# Patient Record
Sex: Female | Born: 1940 | Race: White | Hispanic: No | Marital: Single | Smoking: Former smoker
Health system: Southern US, Community
[De-identification: ages and names within clinical notes are randomized; demographics above are authoritative.]

## PROBLEM LIST (undated history)

## (undated) DIAGNOSIS — C801 Malignant (primary) neoplasm, unspecified: Secondary | ICD-10-CM

## (undated) DIAGNOSIS — Z923 Personal history of irradiation: Secondary | ICD-10-CM

## (undated) HISTORY — PX: BREAST EXCISIONAL BIOPSY: SUR124

## (undated) HISTORY — PX: BREAST BIOPSY: SHX20

## (undated) HISTORY — PX: ABDOMINAL HYSTERECTOMY: SHX81

---

## 2002-12-30 ENCOUNTER — Other Ambulatory Visit: Payer: Self-pay

## 2003-02-03 ENCOUNTER — Other Ambulatory Visit: Payer: Self-pay

## 2003-08-03 ENCOUNTER — Other Ambulatory Visit: Payer: Self-pay

## 2003-11-02 ENCOUNTER — Other Ambulatory Visit: Payer: Self-pay

## 2003-11-19 ENCOUNTER — Ambulatory Visit: Payer: Self-pay | Admitting: Ophthalmology

## 2003-11-23 ENCOUNTER — Ambulatory Visit: Payer: Self-pay | Admitting: Ophthalmology

## 2003-12-07 ENCOUNTER — Emergency Department: Payer: Self-pay | Admitting: Internal Medicine

## 2003-12-23 ENCOUNTER — Emergency Department: Payer: Self-pay | Admitting: Unknown Physician Specialty

## 2003-12-23 ENCOUNTER — Other Ambulatory Visit: Payer: Self-pay

## 2004-01-05 ENCOUNTER — Emergency Department: Payer: Self-pay | Admitting: Unknown Physician Specialty

## 2004-01-13 ENCOUNTER — Ambulatory Visit: Payer: Self-pay | Admitting: Family Medicine

## 2004-03-12 ENCOUNTER — Emergency Department: Payer: Self-pay | Admitting: Emergency Medicine

## 2004-03-22 ENCOUNTER — Emergency Department: Payer: Self-pay | Admitting: Emergency Medicine

## 2004-05-03 ENCOUNTER — Emergency Department: Payer: Self-pay | Admitting: Unknown Physician Specialty

## 2004-05-27 ENCOUNTER — Emergency Department: Payer: Self-pay | Admitting: Emergency Medicine

## 2004-09-18 ENCOUNTER — Ambulatory Visit: Payer: Self-pay | Admitting: Radiation Oncology

## 2004-10-13 ENCOUNTER — Ambulatory Visit: Payer: Self-pay | Admitting: Radiation Oncology

## 2004-11-12 ENCOUNTER — Ambulatory Visit: Payer: Self-pay | Admitting: Radiation Oncology

## 2004-12-13 ENCOUNTER — Ambulatory Visit: Payer: Self-pay | Admitting: Radiation Oncology

## 2005-01-12 ENCOUNTER — Ambulatory Visit: Payer: Self-pay | Admitting: Radiation Oncology

## 2005-03-02 ENCOUNTER — Emergency Department: Payer: Self-pay | Admitting: Emergency Medicine

## 2005-03-09 ENCOUNTER — Ambulatory Visit: Payer: Self-pay | Admitting: Radiation Oncology

## 2005-04-20 ENCOUNTER — Emergency Department: Payer: Self-pay | Admitting: Emergency Medicine

## 2005-08-30 ENCOUNTER — Ambulatory Visit: Payer: Self-pay | Admitting: Radiation Oncology

## 2005-09-18 ENCOUNTER — Emergency Department: Payer: Self-pay | Admitting: Emergency Medicine

## 2005-11-13 ENCOUNTER — Ambulatory Visit: Payer: Self-pay | Admitting: Unknown Physician Specialty

## 2005-11-16 ENCOUNTER — Ambulatory Visit: Payer: Self-pay | Admitting: Unknown Physician Specialty

## 2005-12-12 ENCOUNTER — Ambulatory Visit: Payer: Self-pay | Admitting: *Deleted

## 2005-12-24 ENCOUNTER — Ambulatory Visit: Payer: Self-pay | Admitting: Unknown Physician Specialty

## 2006-01-28 ENCOUNTER — Ambulatory Visit: Payer: Self-pay | Admitting: Radiation Oncology

## 2006-01-29 ENCOUNTER — Emergency Department: Payer: Self-pay | Admitting: Emergency Medicine

## 2006-02-10 ENCOUNTER — Emergency Department: Payer: Self-pay | Admitting: Emergency Medicine

## 2006-04-19 ENCOUNTER — Emergency Department: Payer: Self-pay | Admitting: Emergency Medicine

## 2006-04-19 ENCOUNTER — Other Ambulatory Visit: Payer: Self-pay

## 2006-05-20 ENCOUNTER — Emergency Department: Payer: Self-pay | Admitting: Emergency Medicine

## 2006-06-07 ENCOUNTER — Other Ambulatory Visit: Payer: Self-pay

## 2006-06-07 ENCOUNTER — Emergency Department: Payer: Self-pay | Admitting: Emergency Medicine

## 2006-09-13 ENCOUNTER — Ambulatory Visit: Payer: Self-pay | Admitting: Radiation Oncology

## 2006-10-03 ENCOUNTER — Ambulatory Visit: Payer: Self-pay | Admitting: Radiation Oncology

## 2006-10-14 ENCOUNTER — Ambulatory Visit: Payer: Self-pay | Admitting: Radiation Oncology

## 2006-10-24 ENCOUNTER — Ambulatory Visit: Payer: Self-pay | Admitting: Radiation Oncology

## 2007-07-02 IMAGING — CR PELVIS - 1-2 VIEW
1 series · 1 of 1 positions shown · non-contrast
Comparison: none

REASON FOR EXAM: fall     Minor care 4
COMMENTS:   LMP: Post Hysterectomy

[view not recorded]
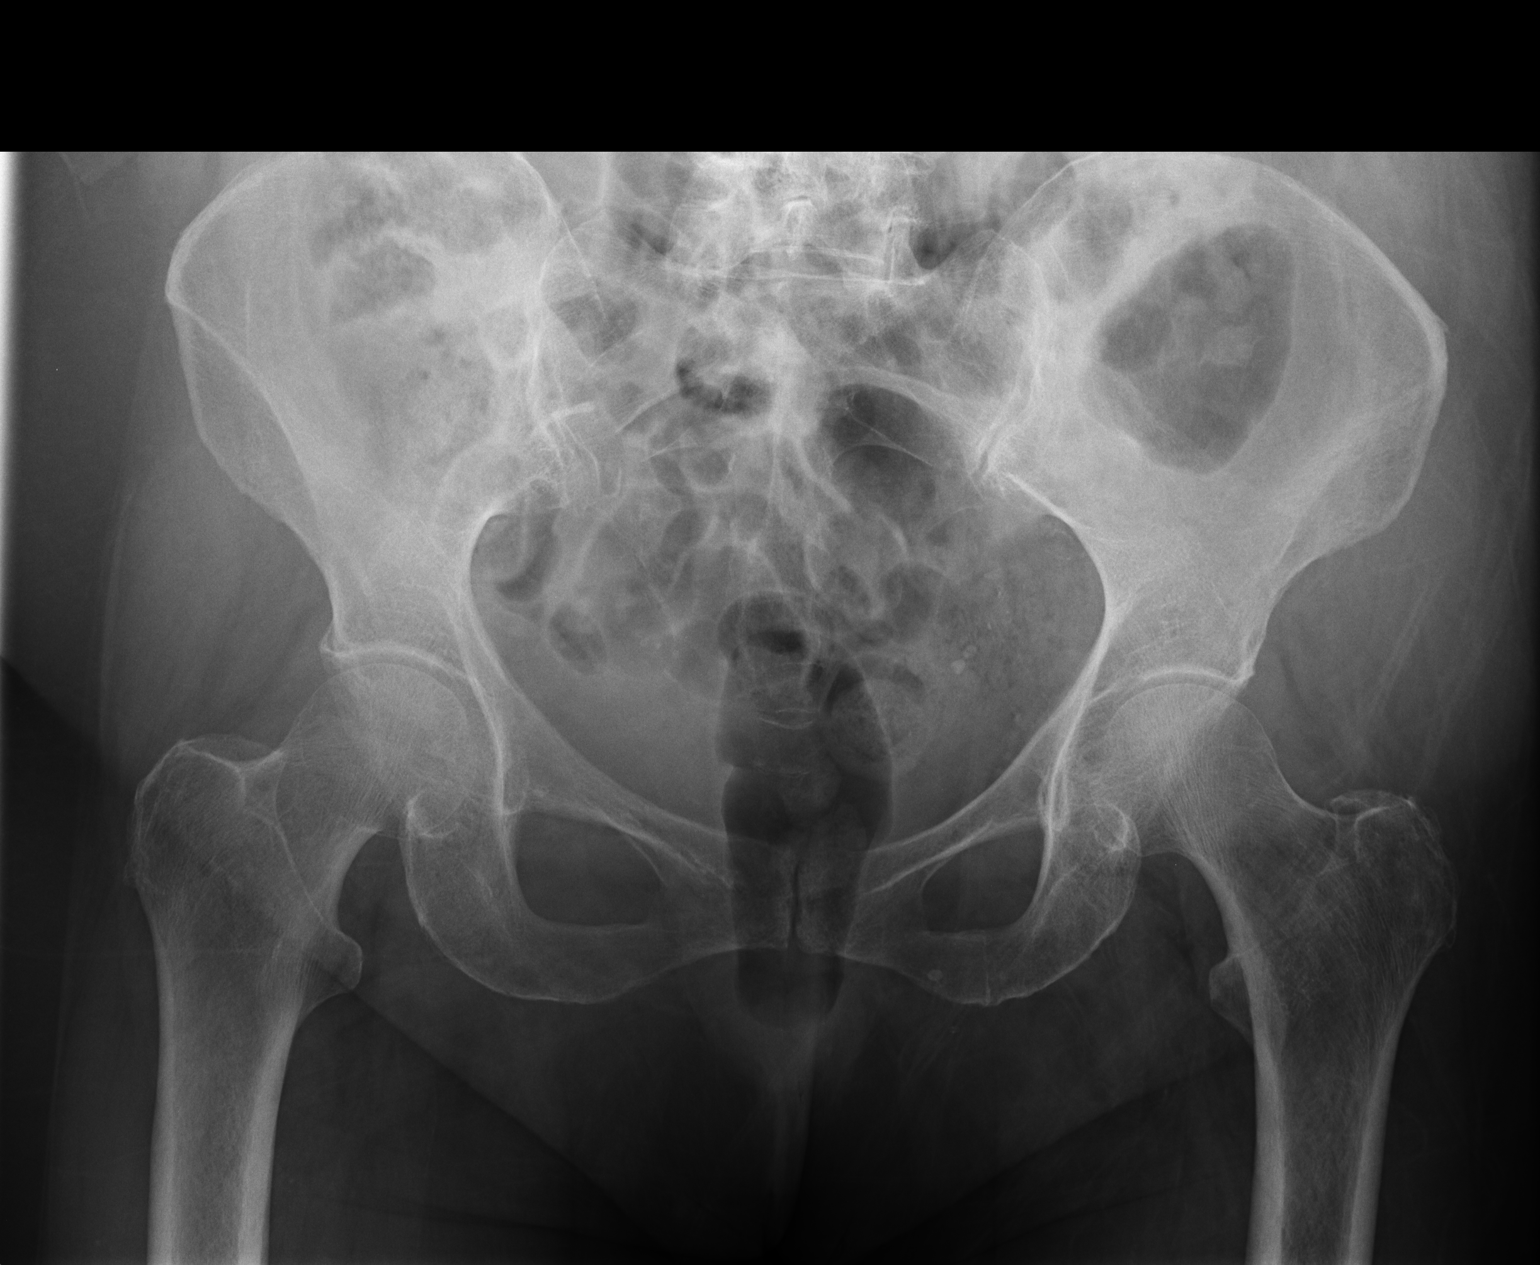

[1 of 1 positions shown; findings below may reference images not displayed]

PROCEDURE:     DXR - DXR PELVIS AP ONLY  - May 20, 2006  [DATE]

RESULT:     The bony pelvis is osteopenic. I do not see evidence of an acute
displaced fracture. There is linear lucency as well as sclerosis noted
through the inferior pubic ramus that is not seen on the accompanying hip
series. This is not felt to reflect an acute fracture. Elsewhere the bony
pelvis appears intact as well.
IMPRESSION: 1.I see no acute bony abnormality of the pelvis. Should the patient's
symptoms persist, follow-up imaging is available upon request.

## 2007-10-01 ENCOUNTER — Ambulatory Visit: Payer: Self-pay

## 2007-10-02 ENCOUNTER — Ambulatory Visit: Payer: Self-pay | Admitting: Radiation Oncology

## 2007-10-14 ENCOUNTER — Ambulatory Visit: Payer: Self-pay | Admitting: Radiation Oncology

## 2008-10-13 ENCOUNTER — Ambulatory Visit: Payer: Self-pay | Admitting: Radiation Oncology

## 2008-11-02 ENCOUNTER — Ambulatory Visit: Payer: Self-pay

## 2008-11-09 ENCOUNTER — Ambulatory Visit: Payer: Self-pay | Admitting: Radiation Oncology

## 2008-11-12 ENCOUNTER — Ambulatory Visit: Payer: Self-pay | Admitting: Radiation Oncology

## 2009-08-16 ENCOUNTER — Emergency Department: Payer: Self-pay | Admitting: Emergency Medicine

## 2009-11-24 ENCOUNTER — Ambulatory Visit: Payer: Self-pay

## 2009-12-01 ENCOUNTER — Ambulatory Visit: Payer: Self-pay | Admitting: Radiation Oncology

## 2009-12-13 ENCOUNTER — Ambulatory Visit: Payer: Self-pay | Admitting: Radiation Oncology

## 2010-11-27 ENCOUNTER — Ambulatory Visit: Payer: Self-pay | Admitting: Family

## 2010-11-30 ENCOUNTER — Ambulatory Visit: Payer: Self-pay | Admitting: Radiation Oncology

## 2010-12-14 ENCOUNTER — Ambulatory Visit: Payer: Self-pay | Admitting: Radiation Oncology

## 2011-12-06 ENCOUNTER — Ambulatory Visit: Payer: Self-pay | Admitting: Family

## 2011-12-17 ENCOUNTER — Ambulatory Visit: Payer: Self-pay | Admitting: Family

## 2012-02-14 ENCOUNTER — Ambulatory Visit: Payer: Self-pay | Admitting: Surgery

## 2012-03-06 ENCOUNTER — Ambulatory Visit: Payer: Self-pay | Admitting: Surgery

## 2012-03-11 LAB — PATHOLOGY REPORT

## 2014-10-27 ENCOUNTER — Other Ambulatory Visit: Payer: Self-pay | Admitting: Geriatric Medicine

## 2014-10-27 DIAGNOSIS — Z1231 Encounter for screening mammogram for malignant neoplasm of breast: Secondary | ICD-10-CM

## 2014-11-15 ENCOUNTER — Other Ambulatory Visit: Payer: Self-pay | Admitting: Geriatric Medicine

## 2014-11-15 DIAGNOSIS — M8580 Other specified disorders of bone density and structure, unspecified site: Secondary | ICD-10-CM

## 2014-11-24 ENCOUNTER — Ambulatory Visit
Admission: RE | Admit: 2014-11-24 | Discharge: 2014-11-24 | Disposition: A | Discharge status: Home / Self Care | Payer: Medicare (Managed Care) | Source: Ambulatory Visit | Attending: Geriatric Medicine | Admitting: Geriatric Medicine

## 2014-11-24 DIAGNOSIS — M810 Age-related osteoporosis without current pathological fracture: Secondary | ICD-10-CM | POA: Insufficient documentation

## 2014-11-24 DIAGNOSIS — Z1231 Encounter for screening mammogram for malignant neoplasm of breast: Secondary | ICD-10-CM

## 2014-11-24 DIAGNOSIS — Z13820 Encounter for screening for osteoporosis: Secondary | ICD-10-CM | POA: Diagnosis present

## 2014-11-24 DIAGNOSIS — M8580 Other specified disorders of bone density and structure, unspecified site: Secondary | ICD-10-CM

## 2014-11-24 HISTORY — DX: Personal history of irradiation: Z92.3

## 2014-11-24 HISTORY — DX: Malignant (primary) neoplasm, unspecified: C80.1

## 2015-03-05 ENCOUNTER — Encounter: Payer: Self-pay | Admitting: Emergency Medicine

## 2015-03-05 ENCOUNTER — Emergency Department: Payer: Medicare (Managed Care)

## 2015-03-05 ENCOUNTER — Inpatient Hospital Stay
Admission: EM | Admit: 2015-03-05 | Discharge: 2015-04-13 | DRG: 208 | Disposition: E | Payer: Medicare (Managed Care) | Attending: Internal Medicine | Admitting: Internal Medicine

## 2015-03-05 DIAGNOSIS — Z66 Do not resuscitate: Secondary | ICD-10-CM | POA: Diagnosis present

## 2015-03-05 DIAGNOSIS — R1110 Vomiting, unspecified: Secondary | ICD-10-CM

## 2015-03-05 DIAGNOSIS — H5702 Anisocoria: Secondary | ICD-10-CM | POA: Diagnosis present

## 2015-03-05 DIAGNOSIS — E873 Alkalosis: Secondary | ICD-10-CM | POA: Diagnosis not present

## 2015-03-05 DIAGNOSIS — Z781 Physical restraint status: Secondary | ICD-10-CM

## 2015-03-05 DIAGNOSIS — J181 Lobar pneumonia, unspecified organism: Secondary | ICD-10-CM

## 2015-03-05 DIAGNOSIS — I10 Essential (primary) hypertension: Secondary | ICD-10-CM | POA: Diagnosis present

## 2015-03-05 DIAGNOSIS — M40209 Unspecified kyphosis, site unspecified: Secondary | ICD-10-CM | POA: Diagnosis present

## 2015-03-05 DIAGNOSIS — J441 Chronic obstructive pulmonary disease with (acute) exacerbation: Secondary | ICD-10-CM | POA: Diagnosis present

## 2015-03-05 DIAGNOSIS — L8990 Pressure ulcer of unspecified site, unspecified stage: Secondary | ICD-10-CM | POA: Insufficient documentation

## 2015-03-05 DIAGNOSIS — J690 Pneumonitis due to inhalation of food and vomit: Secondary | ICD-10-CM | POA: Diagnosis present

## 2015-03-05 DIAGNOSIS — I214 Non-ST elevation (NSTEMI) myocardial infarction: Secondary | ICD-10-CM | POA: Diagnosis present

## 2015-03-05 DIAGNOSIS — Z87891 Personal history of nicotine dependence: Secondary | ICD-10-CM

## 2015-03-05 DIAGNOSIS — J9690 Respiratory failure, unspecified, unspecified whether with hypoxia or hypercapnia: Secondary | ICD-10-CM | POA: Insufficient documentation

## 2015-03-05 DIAGNOSIS — I2510 Atherosclerotic heart disease of native coronary artery without angina pectoris: Secondary | ICD-10-CM | POA: Diagnosis present

## 2015-03-05 DIAGNOSIS — T17990A Other foreign object in respiratory tract, part unspecified in causing asphyxiation, initial encounter: Secondary | ICD-10-CM | POA: Diagnosis present

## 2015-03-05 DIAGNOSIS — Z923 Personal history of irradiation: Secondary | ICD-10-CM | POA: Diagnosis not present

## 2015-03-05 DIAGNOSIS — E785 Hyperlipidemia, unspecified: Secondary | ICD-10-CM | POA: Diagnosis present

## 2015-03-05 DIAGNOSIS — K7460 Unspecified cirrhosis of liver: Secondary | ICD-10-CM | POA: Diagnosis present

## 2015-03-05 DIAGNOSIS — D649 Anemia, unspecified: Secondary | ICD-10-CM | POA: Diagnosis present

## 2015-03-05 DIAGNOSIS — J9622 Acute and chronic respiratory failure with hypercapnia: Secondary | ICD-10-CM | POA: Diagnosis not present

## 2015-03-05 DIAGNOSIS — J440 Chronic obstructive pulmonary disease with acute lower respiratory infection: Principal | ICD-10-CM | POA: Diagnosis present

## 2015-03-05 DIAGNOSIS — I272 Other secondary pulmonary hypertension: Secondary | ICD-10-CM | POA: Diagnosis present

## 2015-03-05 DIAGNOSIS — Z7982 Long term (current) use of aspirin: Secondary | ICD-10-CM

## 2015-03-05 DIAGNOSIS — J9621 Acute and chronic respiratory failure with hypoxia: Secondary | ICD-10-CM | POA: Diagnosis not present

## 2015-03-05 DIAGNOSIS — R1310 Dysphagia, unspecified: Secondary | ICD-10-CM | POA: Diagnosis present

## 2015-03-05 DIAGNOSIS — J9601 Acute respiratory failure with hypoxia: Secondary | ICD-10-CM | POA: Insufficient documentation

## 2015-03-05 DIAGNOSIS — K7200 Acute and subacute hepatic failure without coma: Secondary | ICD-10-CM | POA: Diagnosis present

## 2015-03-05 DIAGNOSIS — R0600 Dyspnea, unspecified: Secondary | ICD-10-CM | POA: Diagnosis not present

## 2015-03-05 DIAGNOSIS — T17890A Other foreign object in other parts of respiratory tract causing asphyxiation, initial encounter: Secondary | ICD-10-CM | POA: Diagnosis present

## 2015-03-05 DIAGNOSIS — E872 Acidosis: Secondary | ICD-10-CM | POA: Diagnosis present

## 2015-03-05 DIAGNOSIS — I248 Other forms of acute ischemic heart disease: Secondary | ICD-10-CM | POA: Diagnosis present

## 2015-03-05 DIAGNOSIS — R740 Nonspecific elevation of levels of transaminase and lactic acid dehydrogenase [LDH]: Secondary | ICD-10-CM | POA: Diagnosis present

## 2015-03-05 DIAGNOSIS — Y95 Nosocomial condition: Secondary | ICD-10-CM | POA: Diagnosis present

## 2015-03-05 DIAGNOSIS — I959 Hypotension, unspecified: Secondary | ICD-10-CM | POA: Diagnosis not present

## 2015-03-05 DIAGNOSIS — J159 Unspecified bacterial pneumonia: Secondary | ICD-10-CM | POA: Diagnosis present

## 2015-03-05 DIAGNOSIS — Z515 Encounter for palliative care: Secondary | ICD-10-CM | POA: Diagnosis present

## 2015-03-05 DIAGNOSIS — J9602 Acute respiratory failure with hypercapnia: Secondary | ICD-10-CM | POA: Diagnosis not present

## 2015-03-05 DIAGNOSIS — D696 Thrombocytopenia, unspecified: Secondary | ICD-10-CM | POA: Diagnosis present

## 2015-03-05 DIAGNOSIS — J90 Pleural effusion, not elsewhere classified: Secondary | ICD-10-CM | POA: Diagnosis present

## 2015-03-05 DIAGNOSIS — N179 Acute kidney failure, unspecified: Secondary | ICD-10-CM | POA: Diagnosis present

## 2015-03-05 DIAGNOSIS — E039 Hypothyroidism, unspecified: Secondary | ICD-10-CM | POA: Diagnosis present

## 2015-03-05 DIAGNOSIS — E876 Hypokalemia: Secondary | ICD-10-CM | POA: Diagnosis present

## 2015-03-05 DIAGNOSIS — Z01818 Encounter for other preprocedural examination: Secondary | ICD-10-CM | POA: Insufficient documentation

## 2015-03-05 DIAGNOSIS — Z853 Personal history of malignant neoplasm of breast: Secondary | ICD-10-CM

## 2015-03-05 DIAGNOSIS — T50995A Adverse effect of other drugs, medicaments and biological substances, initial encounter: Secondary | ICD-10-CM | POA: Diagnosis present

## 2015-03-05 DIAGNOSIS — F0390 Unspecified dementia without behavioral disturbance: Secondary | ICD-10-CM | POA: Diagnosis present

## 2015-03-05 DIAGNOSIS — R918 Other nonspecific abnormal finding of lung field: Secondary | ICD-10-CM | POA: Diagnosis not present

## 2015-03-05 DIAGNOSIS — R945 Abnormal results of liver function studies: Secondary | ICD-10-CM

## 2015-03-05 DIAGNOSIS — R7989 Other specified abnormal findings of blood chemistry: Secondary | ICD-10-CM | POA: Diagnosis not present

## 2015-03-05 DIAGNOSIS — R0602 Shortness of breath: Secondary | ICD-10-CM

## 2015-03-05 DIAGNOSIS — Z9911 Dependence on respirator [ventilator] status: Secondary | ICD-10-CM | POA: Diagnosis not present

## 2015-03-05 DIAGNOSIS — Z978 Presence of other specified devices: Secondary | ICD-10-CM

## 2015-03-05 DIAGNOSIS — J189 Pneumonia, unspecified organism: Secondary | ICD-10-CM

## 2015-03-05 DIAGNOSIS — R109 Unspecified abdominal pain: Secondary | ICD-10-CM

## 2015-03-05 DIAGNOSIS — Z9981 Dependence on supplemental oxygen: Secondary | ICD-10-CM

## 2015-03-05 DIAGNOSIS — E870 Hyperosmolality and hypernatremia: Secondary | ICD-10-CM | POA: Diagnosis not present

## 2015-03-05 DIAGNOSIS — IMO0001 Reserved for inherently not codable concepts without codable children: Secondary | ICD-10-CM

## 2015-03-05 DIAGNOSIS — Z452 Encounter for adjustment and management of vascular access device: Secondary | ICD-10-CM

## 2015-03-05 DIAGNOSIS — Z4659 Encounter for fitting and adjustment of other gastrointestinal appliance and device: Secondary | ICD-10-CM

## 2015-03-05 LAB — CBC WITH DIFFERENTIAL/PLATELET
BASOS PCT: 0 %
Basophils Absolute: 0 10*3/uL (ref 0–0.1)
EOS ABS: 0 10*3/uL (ref 0–0.7)
EOS PCT: 0 %
HCT: 41.6 % (ref 35.0–47.0)
HEMOGLOBIN: 13.7 g/dL (ref 12.0–16.0)
LYMPHS ABS: 0.8 10*3/uL — AB (ref 1.0–3.6)
Lymphocytes Relative: 6 %
MCH: 31 pg (ref 26.0–34.0)
MCHC: 33 g/dL (ref 32.0–36.0)
MCV: 93.9 fL (ref 80.0–100.0)
MONO ABS: 0.9 10*3/uL (ref 0.2–0.9)
MONOS PCT: 8 %
NEUTROS PCT: 86 %
Neutro Abs: 10.4 10*3/uL — ABNORMAL HIGH (ref 1.4–6.5)
Platelets: 232 10*3/uL (ref 150–440)
RBC: 4.43 MIL/uL (ref 3.80–5.20)
RDW: 14.7 % — AB (ref 11.5–14.5)
WBC: 12.1 10*3/uL — ABNORMAL HIGH (ref 3.6–11.0)

## 2015-03-05 LAB — PROTIME-INR
INR: 1.46
PROTHROMBIN TIME: 17.8 s — AB (ref 11.4–15.0)

## 2015-03-05 LAB — COMPREHENSIVE METABOLIC PANEL
ALK PHOS: 133 U/L — AB (ref 38–126)
ALT: 1249 U/L — ABNORMAL HIGH (ref 14–54)
ANION GAP: 10 (ref 5–15)
AST: 1143 U/L — ABNORMAL HIGH (ref 15–41)
Albumin: 3.9 g/dL (ref 3.5–5.0)
BILIRUBIN TOTAL: 0.9 mg/dL (ref 0.3–1.2)
BUN: 54 mg/dL — ABNORMAL HIGH (ref 6–20)
CALCIUM: 8.6 mg/dL — AB (ref 8.9–10.3)
CO2: 40 mmol/L — AB (ref 22–32)
Chloride: 91 mmol/L — ABNORMAL LOW (ref 101–111)
Creatinine, Ser: 2.26 mg/dL — ABNORMAL HIGH (ref 0.44–1.00)
GFR calc non Af Amer: 20 mL/min — ABNORMAL LOW (ref >60)
GFR, EST AFRICAN AMERICAN: 23 mL/min — AB (ref >60)
Glucose, Bld: 120 mg/dL — ABNORMAL HIGH (ref 65–99)
POTASSIUM: 3.1 mmol/L — AB (ref 3.5–5.1)
SODIUM: 141 mmol/L (ref 135–145)
TOTAL PROTEIN: 6.8 g/dL (ref 6.5–8.1)

## 2015-03-05 LAB — ACETAMINOPHEN LEVEL: Acetaminophen (Tylenol), Serum: <10 ug/mL — ABNORMAL LOW (ref 10–30)

## 2015-03-05 LAB — TROPONIN I
TROPONIN I: 5.55 ng/mL — AB (ref <0.031)
Troponin I: 3.97 ng/mL — ABNORMAL HIGH (ref <0.031)

## 2015-03-05 LAB — APTT: aPTT: 27 seconds (ref 24–36)

## 2015-03-05 MED ORDER — DEXTROSE 5 % IV SOLN
2.0000 g | Freq: Once | INTRAVENOUS | Status: AC
  Administered 2015-03-05: 2 g via INTRAVENOUS
  Filled 2015-03-05: qty 2

## 2015-03-05 MED ORDER — ATORVASTATIN CALCIUM 20 MG PO TABS
20.0000 mg | ORAL_TABLET | Freq: Every day | ORAL | Status: DC
  Administered 2015-03-05 – 2015-03-07 (×3): 20 mg via ORAL
  Filled 2015-03-05 (×3): qty 1

## 2015-03-05 MED ORDER — VANCOMYCIN HCL IN DEXTROSE 1-5 GM/200ML-% IV SOLN
1000.0000 mg | Freq: Once | INTRAVENOUS | Status: AC
  Administered 2015-03-05: 1000 mg via INTRAVENOUS
  Filled 2015-03-05: qty 200

## 2015-03-05 MED ORDER — SENNA 8.6 MG PO TABS
2.0000 | ORAL_TABLET | Freq: Every day | ORAL | Status: DC
  Administered 2015-03-10 – 2015-03-20 (×9): 17.2 mg via ORAL
  Filled 2015-03-05 (×14): qty 2

## 2015-03-05 MED ORDER — DEXTROSE 5 % IV SOLN
1.0000 g | Freq: Three times a day (TID) | INTRAVENOUS | Status: DC
  Administered 2015-03-05 – 2015-03-08 (×8): 1 g via INTRAVENOUS
  Filled 2015-03-05 (×10): qty 1

## 2015-03-05 MED ORDER — ASPIRIN 300 MG RE SUPP
300.0000 mg | Freq: Once | RECTAL | Status: AC
  Administered 2015-03-05: 300 mg via RECTAL
  Filled 2015-03-05: qty 1

## 2015-03-05 MED ORDER — MORPHINE SULFATE (PF) 2 MG/ML IV SOLN
2.0000 mg | INTRAVENOUS | Status: DC | PRN
  Administered 2015-03-06 – 2015-03-12 (×2): 2 mg via INTRAVENOUS
  Filled 2015-03-05 (×2): qty 1

## 2015-03-05 MED ORDER — FAMOTIDINE 20 MG PO TABS
20.0000 mg | ORAL_TABLET | Freq: Two times a day (BID) | ORAL | Status: DC
  Administered 2015-03-05 – 2015-03-14 (×14): 20 mg via ORAL
  Filled 2015-03-05 (×14): qty 1

## 2015-03-05 MED ORDER — BUDESONIDE 0.25 MG/2ML IN SUSP
0.2500 mg | Freq: Two times a day (BID) | RESPIRATORY_TRACT | Status: DC
  Administered 2015-03-06 – 2015-03-07 (×3): 0.25 mg via RESPIRATORY_TRACT
  Filled 2015-03-05 (×3): qty 2

## 2015-03-05 MED ORDER — VERAPAMIL HCL ER 180 MG PO TBCR
300.0000 mg | EXTENDED_RELEASE_TABLET | Freq: Every day | ORAL | Status: DC
  Administered 2015-03-06 – 2015-03-07 (×2): 300 mg via ORAL
  Filled 2015-03-05 (×6): qty 1

## 2015-03-05 MED ORDER — LOSARTAN POTASSIUM 50 MG PO TABS
100.0000 mg | ORAL_TABLET | Freq: Every day | ORAL | Status: DC
  Administered 2015-03-06 – 2015-03-08 (×3): 100 mg via ORAL
  Filled 2015-03-05 (×3): qty 2

## 2015-03-05 MED ORDER — VANCOMYCIN HCL IN DEXTROSE 1-5 GM/200ML-% IV SOLN
1000.0000 mg | INTRAVENOUS | Status: DC
  Administered 2015-03-06: 1000 mg via INTRAVENOUS
  Filled 2015-03-05: qty 200

## 2015-03-05 MED ORDER — HEPARIN (PORCINE) IN NACL 100-0.45 UNIT/ML-% IJ SOLN
12.0000 [IU]/kg/h | INTRAMUSCULAR | Status: DC
  Administered 2015-03-05: 12 [IU]/kg/h via INTRAVENOUS
  Filled 2015-03-05: qty 250

## 2015-03-05 MED ORDER — PHENYTOIN SODIUM EXTENDED 100 MG PO CAPS
300.0000 mg | ORAL_CAPSULE | Freq: Every day | ORAL | Status: DC
  Administered 2015-03-05 – 2015-03-13 (×7): 300 mg via ORAL
  Filled 2015-03-05 (×9): qty 3

## 2015-03-05 MED ORDER — ASPIRIN EC 81 MG PO TBEC
81.0000 mg | DELAYED_RELEASE_TABLET | Freq: Every day | ORAL | Status: DC
  Administered 2015-03-06 – 2015-03-17 (×8): 81 mg via ORAL
  Filled 2015-03-05 (×8): qty 1

## 2015-03-05 MED ORDER — DULOXETINE HCL 30 MG PO CPEP
60.0000 mg | ORAL_CAPSULE | Freq: Every day | ORAL | Status: DC
  Administered 2015-03-06 – 2015-03-17 (×9): 60 mg via ORAL
  Filled 2015-03-05 (×3): qty 2
  Filled 2015-03-05: qty 1
  Filled 2015-03-05 (×5): qty 2

## 2015-03-05 MED ORDER — ACETAMINOPHEN 650 MG RE SUPP: 650.0000 mg | Freq: Four times a day (QID) | RECTAL | Status: DC | PRN

## 2015-03-05 MED ORDER — POTASSIUM CHLORIDE IN NACL 20-0.9 MEQ/L-% IV SOLN
INTRAVENOUS | Status: DC
  Administered 2015-03-05 – 2015-03-07 (×2): via INTRAVENOUS
  Filled 2015-03-05 (×6): qty 1000

## 2015-03-05 MED ORDER — LEVOTHYROXINE SODIUM 25 MCG PO TABS
175.0000 ug | ORAL_TABLET | Freq: Every day | ORAL | Status: DC
  Administered 2015-03-05 – 2015-03-20 (×13): 175 ug via ORAL
  Filled 2015-03-05 (×16): qty 1

## 2015-03-05 MED ORDER — POTASSIUM CHLORIDE CRYS ER 20 MEQ PO TBCR
40.0000 meq | EXTENDED_RELEASE_TABLET | Freq: Once | ORAL | Status: AC
  Administered 2015-03-05: 40 meq via ORAL
  Filled 2015-03-05: qty 2

## 2015-03-05 MED ORDER — LORATADINE 10 MG PO TABS
10.0000 mg | ORAL_TABLET | Freq: Every day | ORAL | Status: DC
  Administered 2015-03-06 – 2015-03-24 (×14): 10 mg via ORAL
  Filled 2015-03-05 (×14): qty 1

## 2015-03-05 MED ORDER — VITAMIN D 1000 UNITS PO TABS
1000.0000 [IU] | ORAL_TABLET | Freq: Every day | ORAL | Status: DC
  Administered 2015-03-06 – 2015-03-21 (×11): 1000 [IU] via ORAL
  Filled 2015-03-05 (×11): qty 1

## 2015-03-05 MED ORDER — CETYLPYRIDINIUM CHLORIDE 0.05 % MT LIQD
7.0000 mL | Freq: Two times a day (BID) | OROMUCOSAL | Status: DC
  Administered 2015-03-06: 7 mL via OROMUCOSAL

## 2015-03-05 MED ORDER — HEPARIN BOLUS VIA INFUSION
4000.0000 [IU] | Freq: Once | INTRAVENOUS | Status: AC
  Administered 2015-03-05: 4000 [IU] via INTRAVENOUS
  Filled 2015-03-05: qty 4000

## 2015-03-05 MED ORDER — IPRATROPIUM-ALBUTEROL 0.5-2.5 (3) MG/3ML IN SOLN
3.0000 mL | Freq: Four times a day (QID) | RESPIRATORY_TRACT | Status: DC
  Administered 2015-03-05 – 2015-03-07 (×8): 3 mL via RESPIRATORY_TRACT
  Filled 2015-03-05 (×8): qty 3

## 2015-03-05 MED ORDER — ATENOLOL 50 MG PO TABS
50.0000 mg | ORAL_TABLET | Freq: Every day | ORAL | Status: DC
  Administered 2015-03-05 – 2015-03-13 (×5): 50 mg via ORAL
  Filled 2015-03-05 (×3): qty 2
  Filled 2015-03-05 (×2): qty 1
  Filled 2015-03-05: qty 2

## 2015-03-05 MED ORDER — SODIUM CHLORIDE 0.9 % IJ SOLN
3.0000 mL | Freq: Two times a day (BID) | INTRAMUSCULAR | Status: DC
  Administered 2015-03-06 – 2015-03-23 (×28): 3 mL via INTRAVENOUS

## 2015-03-05 MED ORDER — ONDANSETRON HCL 4 MG PO TABS
4.0000 mg | ORAL_TABLET | Freq: Four times a day (QID) | ORAL | Status: DC | PRN
  Administered 2015-03-06: 4 mg via ORAL
  Filled 2015-03-05: qty 1

## 2015-03-05 MED ORDER — TRAZODONE HCL 50 MG PO TABS
50.0000 mg | ORAL_TABLET | Freq: Every day | ORAL | Status: DC
  Administered 2015-03-05 – 2015-03-20 (×13): 50 mg via ORAL
  Filled 2015-03-05 (×13): qty 1

## 2015-03-05 MED ORDER — ONDANSETRON HCL 4 MG/2ML IJ SOLN: 4.0000 mg | Freq: Four times a day (QID) | INTRAMUSCULAR | Status: DC | PRN

## 2015-03-05 MED ORDER — ACETAMINOPHEN 325 MG PO TABS: 650.0000 mg | ORAL_TABLET | Freq: Four times a day (QID) | ORAL | Status: DC | PRN

## 2015-03-05 MED ORDER — FLUTICASONE PROPIONATE (INHAL) 50 MCG/BLIST IN AEPB
2.0000 | INHALATION_SPRAY | Freq: Two times a day (BID) | RESPIRATORY_TRACT | Status: DC
  Filled 2015-03-05 (×9): qty 1

## 2015-03-05 MED ORDER — SODIUM CHLORIDE 0.9 % IV BOLUS (SEPSIS)
1000.0000 mL | Freq: Once | INTRAVENOUS | Status: AC
  Administered 2015-03-05: 1000 mL via INTRAVENOUS

## 2015-03-05 MED ORDER — DOCUSATE SODIUM 100 MG PO CAPS
100.0000 mg | ORAL_CAPSULE | Freq: Two times a day (BID) | ORAL | Status: DC
  Administered 2015-03-05 – 2015-03-18 (×18): 100 mg via ORAL
  Filled 2015-03-05 (×18): qty 1

## 2015-03-05 MED ORDER — BISACODYL 10 MG RE SUPP: 10.0000 mg | Freq: Every day | RECTAL | Status: DC | PRN

## 2015-03-05 MED ORDER — CALCIUM CARBONATE ANTACID 500 MG PO CHEW
1000.0000 mg | CHEWABLE_TABLET | ORAL | Status: DC | PRN
  Administered 2015-03-06: 1000 mg via ORAL
  Filled 2015-03-05: qty 2

## 2015-03-05 NOTE — H&P (Signed)
History and Physical    Julie Anthony [id number] DOB: [birth year] DOA: 02/28/2015  Referring physician: Dr. Jacqualine Code PCP: [place]  Specialists: none  Chief Complaint: SOB and cough  HPI: Julie Anthony is a 75 y.o. female has a past medical history significant for CAD, dementia, COPD/asthma, OA and breast cancer who presents to ER with persistent cough, congestion, and SOB. Was being treated by her PCP for pneumonia with Septra DS. In ER, pt found to have persistent LLL pneumonia with ARF and abnormal LFT's. Also noted to have troponin=5.5 c/w NSTEMI. She denies CP. She is now admitted for further evaluation.  Review of Systems: The patient denies anorexia, fever, weight loss,, vision loss, decreased hearing, hoarseness, chest pain, syncope, dyspnea on exertion, peripheral edema, balance deficits, hemoptysis, abdominal pain, melena, hematochezia, severe indigestion/heartburn, hematuria, incontinence, genital sores, muscle weakness, suspicious skin lesions, transient blindness, difficulty walking, depression, unusual weight change, abnormal bleeding, enlarged lymph nodes, angioedema, and breast masses.   Past Medical History  Diagnosis Date  . Cancer [facility])     right breast cancer  . S/P radiation therapy     right breast cancer   Past Surgical History  Procedure Laterality Date  . Abdominal hysterectomy    . Breast excisional biopsy Right     positive 2006  . Breast biopsy Left     negative 02/2012 stereotactic   Social History:  reports that she quit smoking about 5 years ago. She does not have any smokeless tobacco history on file. Her alcohol and drug histories are not on file.  Allergies  Allergen Reactions  . Penicillins Rash  . Codeine Other (See Comments)    Unknown reaction.  . Ceftin [Cefuroxime Axetil] Other (See Comments)    Unknown reaction  . Erythromycin Other (See Comments)    Unknown reaction  . Ibuprofen Other (See Comments)   Unknown reaction  . Sulfa Antibiotics Other (See Comments)    Unknown reaction    Family History  Problem Relation Age of Onset  . Breast cancer Neg Hx     Prior to Admission medications   Not on File   Physical Exam: Filed Vitals:   03/11/2015 1235 02/23/2015 1306 03/14/2015 1400 02/16/2015 1617  BP: 162/90 159/78 162/96   Pulse: 102 47 57   Temp: 98.7 F (37.1 C)     TempSrc: Oral     Resp:  27 21   Height:    5\' 5"  (1.651 m)  Weight: 66.951 kg (147 lb 9.6 oz)     SpO2: 100% 98% 98%      General:  No apparent distress, talkative on O2  Eyes: PERRL, EOMI, no scleral icterus, conjunctiva clear  ENT: moist oropharynx, dentition poor  Neck: supple, no lymphadenopathy. No bruits. No JVD or thyromegaly  Cardiovascular: regular rate without MRG; 2+ peripheral pulses, no JVD, 1+ peripheral edema  Respiratory: decreased breath sounds at left base with scattered rhonchi. No wheezes or rales.  Abdomen: soft, non tender to palpation, positive bowel sounds, no guarding, no rebound  Skin: no rashes or lesions  Musculoskeletal: normal bulk and tone, no joint swelling  Psychiatric: normal mood and affect  Neurologic: CN 2-12 grossly intact, motor strength 5/5 in all 4 groups. Sensory exam normal. DTR's symmetric  Labs on Admission:  Basic Metabolic Panel:  Recent Labs Lab 02/27/2015 1300  NA 141  K 3.1*  CL 91*  CO2 40*  GLUCOSE 120*  BUN 54*  CREATININE 2.26*  CALCIUM  8.6*   Liver Function Tests:  Recent Labs Lab 03/01/2015 1300  AST 1143*  ALT 1249*  ALKPHOS 133*  BILITOT 0.9  PROT 6.8  ALBUMIN 3.9   No results for input(s): LIPASE, AMYLASE in the last 168 hours. No results for input(s): AMMONIA in the last 168 hours. CBC:  Recent Labs Lab 03/12/2015 1300  WBC 12.1*  NEUTROABS 10.4*  HGB 13.7  HCT 41.6  MCV 93.9  PLT 232   Cardiac Enzymes:  Recent Labs Lab 03/13/2015 1300  TROPONINI 5.55*    BNP (last 3 results) No results for input(s): BNP in  the last 8760 hours.  ProBNP (last 3 results) No results for input(s): PROBNP in the last 8760 hours.  CBG: No results for input(s): GLUCAP in the last 168 hours.  Radiological Exams on Admission: Dg Chest 2 View  02/13/2015  CLINICAL DATA:  Shortness of breath and productive cough EXAM: CHEST - 2 VIEW COMPARISON:  None. FINDINGS: Cardiac shadow is mildly enlarged. Thoracic aorta is tortuous. The right lung is clear. The left lung demonstrates mild infiltrate projecting in the left lower lobe posteriorly. No acute bony abnormality is noted. IMPRESSION: Left basilar infiltrate. Electronically Signed   By: Inez Catalina M.D.   On: 03/11/2015 13:53   US Abdomen Limited Ruq  02/16/2015  CLINICAL DATA:  Abdominal pain EXAM: US ABDOMEN LIMITED - RIGHT UPPER QUADRANT COMPARISON:  None. FINDINGS: Gallbladder: Surgical absent Common bile duct: Diameter: 5.8 mm in diameter within normal limits. Liver: No focal lesion identified. Within normal limits in parenchymal echogenicity. There is atrophic right kidney measures 5.7 cm in length partially visualized. IMPRESSION: 1. Surgical absent gallbladder. Normal CBD. Unremarkable liver. Question right renal atrophy. Electronically Signed   By: Lahoma Crocker M.D.   On: 02/13/2015 15:33    EKG: Independently reviewed.  Assessment/Plan Principal Problem:   NSTEMI (non-ST elevated myocardial infarction) ([place]) Active Problems:   HCAP (healthcare-associated pneumonia)   Abnormal LFTs   ARF (acute renal failure) ([place])   Will admit to telemetry with Heparin drip and follow enzymes. Order echo and Cardiology consult. Follow LFT's and consult GI. Begin IV fluids and consult Nephrology. Repeat labs and CXR in AM. IV ABX and SVN's for pneumonia. PT and CSW consults for placement.  Diet: soft Fluids: NS@100  DVT Prophylaxis: IV Heparin  Code Status: FULL  Family Communication: none  Disposition Plan: SNF  Time spent: 55 min

## 2015-03-05 NOTE — Progress Notes (Addendum)
ANTIBIOTIC CONSULT NOTE - INITIAL  Pharmacy Consult for Aztreonam  Indication: pneumonia  Allergies  Allergen Reactions  . Penicillins Rash  . Codeine Other (See Comments)    Unknown reaction.  . Ceftin [Cefuroxime Axetil] Other (See Comments)    Unknown reaction  . Erythromycin Other (See Comments)    Unknown reaction  . Ibuprofen Other (See Comments)    Unknown reaction  . Sulfa Antibiotics Other (See Comments)    Unknown reaction    Patient Measurements: Height: 5\' 5"  (165.1 cm) Weight: 147 lb 9.6 oz (66.951 kg) IBW/kg (Calculated) : 57 Adjusted Body Weight: 61 kg   Vital Signs: Temp: 97.4 F (36.3 C) (01/21 1908) Temp Source: Oral (01/21 1908) BP: 168/58 mmHg (01/21 1908) Pulse Rate: 46 (01/21 1908) Intake/Output from previous day:   Intake/Output from this shift:    Labs:  Recent Labs  03/13/2015 1300  WBC 12.1*  HGB 13.7  PLT 232  CREATININE 2.26*   Estimated Creatinine Clearance: 19.1 mL/min (by C-G formula based on Cr of 2.26). No results for input(s): VANCOTROUGH, VANCOPEAK, VANCORANDOM, GENTTROUGH, GENTPEAK, GENTRANDOM, TOBRATROUGH, TOBRAPEAK, TOBRARND, AMIKACINPEAK, AMIKACINTROU, AMIKACIN in the last 72 hours.   Microbiology: No results found for this or any previous visit (from the past 720 hour(s)).  Medical History: Past Medical History  Diagnosis Date  . Cancer [facility])     right breast cancer  . S/P radiation therapy     right breast cancer    Medications:  Prescriptions prior to admission  Medication Sig Dispense Refill Last Dose  . acetaminophen (TYLENOL ARTHRITIS PAIN) 650 MG CR tablet Take 1,300 mg by mouth 2 (two) times daily.   unknown  . albuterol (PROVENTIL HFA;VENTOLIN HFA) 108 (90 Base) MCG/ACT inhaler Inhale 2 puffs into the lungs See admin instructions. Inhale 2 puffs every 4 to 6 hours as needed for shortness of breath.   prn  . albuterol (PROVENTIL) (2.5 MG/3ML) 0.083% nebulizer solution Take 2.5 mg by nebulization 4 (four)  times daily as needed for wheezing or shortness of breath (cough).   prn  . aspirin EC 81 MG tablet Take 81 mg by mouth at bedtime.   unknown  . atenolol (TENORMIN) 50 MG tablet Take 50 mg by mouth at bedtime.   unknown  . atorvastatin (LIPITOR) 20 MG tablet Take 20 mg by mouth at bedtime.   unknown  . azithromycin (ZITHROMAX) 250 MG tablet Take 250-500 mg by mouth See admin instructions. Take 2 tablets (500mg ) by mouth now, then 1 tablet by mouth every day x 4 days.   unknown  . calcium carbonate (TUMS) 500 MG chewable tablet Chew 1,000 mg by mouth every 2 (two) hours as needed for indigestion or heartburn. *Maximum 10 tablets a day*   unknown  . Carboxymethylcellul-Glycerin 0.5-0.9 % SOLN Apply 1-2 drops to eye 3 (three) times daily as needed (for dry eye.).   prn  . cetirizine (ZYRTEC) 10 MG tablet Take 10 mg by mouth at bedtime.   unknown  . Cholecalciferol 1000 units capsule Take 1,000 Units by mouth daily.   unknown  . diclofenac sodium (VOLTAREN) 1 % GEL Apply 2-4 g topically See admin instructions. Apply 2 grams to affected area above waist, and 4 grams to affected area below the waist up to 4 times as needed for pain.   unknown  . docusate sodium (COLACE) 100 MG capsule Take 100 mg by mouth daily.   unknown  . DULoxetine (CYMBALTA) 60 MG capsule Take 60 mg by mouth  daily.   unknown  . fluticasone (FLOVENT DISKUS) 50 MCG/BLIST diskus inhaler Inhale 2 puffs into the lungs 2 (two) times daily.   unknown  . levothyroxine (SYNTHROID, LEVOTHROID) 175 MCG tablet Take 175 mcg by mouth at bedtime.   unknown  . losartan-hydrochlorothiazide (HYZAAR) 100-25 MG tablet Take 1 tablet by mouth daily.   unknown  . phenytoin (DILANTIN) 100 MG ER capsule Take 300 mg by mouth at bedtime.   unknown  . predniSONE (DELTASONE) 20 MG tablet Take 40 mg by mouth daily. X 5 days.   unknown  . ranitidine (ZANTAC) 150 MG capsule Take 150 mg by mouth 2 (two) times daily.   unknown  . senna (SENOKOT) 8.6 MG tablet Take 2  tablets by mouth at bedtime.   unknown  . sodium chloride (NASAL MOIST) 0.65 % nasal spray Place 2 sprays into the nose every 2 (two) hours as needed for congestion.   prn  . traZODone (DESYREL) 50 MG tablet Take 50 mg by mouth at bedtime.   unknown  . Verapamil HCl CR 300 MG CP24 Take 300 mg by mouth daily.   unknown   Assessment: CrCl = 19.1 ml/min Ke = 0.02 hr-1 T1/2 = 34.7 hrs Vd = 46.9 L   Goal of Therapy:  Vancomycin trough level 15-20 mcg/ml  Plan:  Expected duration 7 days with resolution of temperature and/or normalization of WBC   Vancomycin 1 gm X 1 given on 1/21 @ 15:00. Vancomycin 1 gm IV Q48H ordered to start 1/22 @ 10:00, ~ 19 hrs after 1st dose (stacked dosing). This pt will not reach Css until 1/28. Will draw 1st trough on 1/24 @ 9:30, which will not be at Css.   Aztreonam 1 gm IV Q8H ordered to start 1/22 @ 00:00.   Julie Anthony 02/22/2015,8:10 PM

## 2015-03-05 NOTE — ED Notes (Signed)
Patient arrives from home at the request of Home health with complaint of shortness of breath and productive cough. Patient has been on 2 rounds of prednisone and antibiotics , yet worsening today. Patient is on chronic 2LNC at home. Patient rcvd 2 duonebs enroute

## 2015-03-05 NOTE — Progress Notes (Signed)
Patient arrived to 2A Room 239. Patient denies pain and all questions answered. Patient oriented to unit and Fall Safety Plan signed. Skin assessment completed with Abigail RN; skin intact with minor redness noted on heels. VS: BP 168/58, HR 46, RR 16, SpO2 93% 2L [place], T 97.4 orally. Nursing staff will continue to monitor. Earleen Reaper, RN

## 2015-03-05 NOTE — ED Provider Notes (Addendum)
[facility] Emergency Department Provider Note  ____________________________________________  Time seen: Approximately 2:28 PM  I have reviewed the triage vital signs and the nursing notes.   HISTORY  Chief Complaint Dysphagia and Cough    HPI Julie Anthony is a 75 y.o. female since for the evaluation of a cough, congestion, and shortness of breath.  Patient reports that for the last week she has been having productive cough. Occasional chills. He been taking antibiotics from [place], and is no not improving. Denies nausea vomiting chest pain or other concerns. She reports that she does just feel generally fatigued.  Currently on Septra.  Past Medical History  Diagnosis Date  . Cancer [facility])     right breast cancer  . S/P radiation therapy     right breast cancer    There are no active problems to display for this patient.   Past Surgical History  Procedure Laterality Date  . Abdominal hysterectomy    . Breast excisional biopsy Right     positive 2006  . Breast biopsy Left     negative 02/2012 stereotactic    No current outpatient prescriptions on file.  Allergies Penicillins  Family History  Problem Relation Age of Onset  . Breast cancer Neg Hx     Social History Social History  Substance Use Topics  . Smoking status: None  . Smokeless tobacco: None  . Alcohol Use: None    Review of Systems Constitutional: Chills. Currently on Septra. Eyes: No visual changes. ENT: No sore throat. Cardiovascular: Denies chest pain. Respiratory: See history of present illness Gastrointestinal: No abdominal pain.  No nausea, no vomiting.  No diarrhea.  No constipation. Genitourinary: Negative for dysuria. Musculoskeletal: Negative for back pain. Skin: Negative for rash. Neurological: Negative for headaches, focal weakness or numbness.  10-point ROS otherwise negative.  ____________________________________________   PHYSICAL  EXAM:  VITAL SIGNS: ED Triage Vitals  Enc Vitals Group     BP 02/27/2015 1235 162/90 mmHg     Pulse Rate 02/13/2015 1235 102     Resp 03/14/2015 1306 27     Temp 03/14/2015 1235 98.7 F (37.1 C)     Temp Source 02/28/2015 1235 Oral     SpO2 03/11/2015 1235 100 %     Weight 02/21/2015 1235 147 lb 9.6 oz (66.951 kg)     Height --      Head Cir --      Peak Flow --      Pain Score --      Pain Loc --      Pain Edu? --      Excl. in [place]? --    Constitutional: Alert and oriented. Somewhat fatigued appearing but in no acute distress.  Eyes: Conjunctivae are normal. PERRL. EOMI. Head: Atraumatic. Nose: No congestion/rhinnorhea. Mouth/Throat: Mucous membranes are very dry  Oropharynx non-erythematous. Neck: No stridor.   Cardiovascular: Normal rate, regular rhythm. Grossly normal heart sounds.  Good peripheral circulation. Respiratory: Mild tachypnea, mild use of accessory muscles, right lung is clear, left lower lung demonstrates rales. No wheezing. Gastrointestinal: Soft and nontender. No distention. No abdominal bruits.  Musculoskeletal: No lower extremity tenderness nor edema.  No joint effusions. Neurologic:  Normal speech and language. No gross focal neurologic deficits are appreciated. Generalized nonfocal mild weakness.  Skin:  Skin is warm, dry and intact. No rash noted. Psychiatric: Mood and affect are normal. Speech and behavior are normal.  ____________________________________________   LABS (all labs ordered are listed,  but only abnormal results are displayed)  Labs Reviewed  CBC WITH DIFFERENTIAL/PLATELET - Abnormal; Notable for the following:    WBC 12.1 (*)    RDW 14.7 (*)    Neutro Abs 10.4 (*)    Lymphs Abs 0.8 (*)    All other components within normal limits  COMPREHENSIVE METABOLIC PANEL - Abnormal; Notable for the following:    Potassium 3.1 (*)    Chloride 91 (*)    CO2 40 (*)    Glucose, Bld 120 (*)    BUN 54 (*)    Creatinine, Ser 2.26 (*)    Calcium 8.6 (*)     AST 1143 (*)    ALT 1249 (*)    Alkaline Phosphatase 133 (*)    GFR calc non Af Amer 20 (*)    GFR calc Af Amer 23 (*)    All other components within normal limits  ACETAMINOPHEN LEVEL - Abnormal; Notable for the following:    Acetaminophen (Tylenol), Serum <10 (*)    All other components within normal limits  PROTIME-INR - Abnormal; Notable for the following:    Prothrombin Time 17.8 (*)    All other components within normal limits  TROPONIN I - Abnormal; Notable for the following:    Troponin I 5.55 (*)    All other components within normal limits  CULTURE, BLOOD (ROUTINE X 2)  CULTURE, BLOOD (ROUTINE X 2)  HEPATITIS PANEL, ACUTE   ____________________________________________  EKG  Reviewed and interpreted by me at 1235 Heart rate 55 QRS 100 QTc 470 Normal sinus rhythm, occasional PAC Minimal nonspecific T-wave abnormality noted in multiple leads  ____________________________________________  RADIOLOGY  DG Chest 2 View (Final result) Result time: 02/22/2015 13:53:49   Final result by Rad Results In Interface (02/23/2015 13:53:49)   Narrative:   CLINICAL DATA: Shortness of breath and productive cough  EXAM: CHEST - 2 VIEW  COMPARISON: None.  FINDINGS: Cardiac shadow is mildly enlarged. Thoracic aorta is tortuous. The right lung is clear. The left lung demonstrates mild infiltrate projecting in the left lower lobe posteriorly. No acute bony abnormality is noted.  IMPRESSION: Left basilar infiltrate.   Electronically Signed By: Inez Catalina M.D. On: 03/04/2015 13:53       ____________________________________________   PROCEDURES  Procedure(s) performed: None  Critical Care performed: Yes, see critical care note(s)   CRITICAL CARE Performed by: Delman Kitten   Total critical care time: 35 minutes  Critical care time was exclusive of separately billable procedures and treating other patients.  Critical care was necessary to treat or  prevent imminent or life-threatening deterioration.  Critical care was time spent personally by me on the following activities: development of treatment plan with patient and/or surrogate as well as nursing, discussions with consultants, evaluation of patient's response to treatment, examination of patient, obtaining history from patient or surrogate, ordering and performing treatments and interventions, ordering and review of laboratory studies, ordering and review of radiographic studies, pulse oximetry and re-evaluation of patient's condition.   ____________________________________________   INITIAL IMPRESSION / ASSESSMENT AND PLAN / ED COURSE  Pertinent labs & imaging results that were available during my care of the patient were reviewed by me and considered in my medical decision making (see chart for details).  Patient presents for evaluation of ongoing cough and shortness of breath. Chest x-ray and clinical history appear to be most consistent with left lower lobe pneumonia, likely resistant and is not responding to outpatient antibiotics. We'll start her on broad-spectrum antibiotics,  clinically does meet sepsis criteria.  Interestingly, patient has significant transaminitis but does not report or have focal abdominal pain in the right upper quadrant. She does have a previous history of cancer. I will obtain all show the right upper quadrant to further evaluate prior to the decision for admission to exclude acute right proper quadrant pathology.  ----------------------------------------- 4:12 PM on 03/02/2015 -----------------------------------------  Patient's troponin significantly elevated. She denies any pain. Her EKG does not demonstrate acute ST elevation however does have nonspecific T-wave abnormality as noted previously, consider possibility of some mild global ischemia. We will initiate aspirin, heparin, and have consult to Dr. Chancy Milroy of cardiology. Also obtain CT abdomen and  pelvis to further evaluate for cause of significant transaminitis.  Admitting to the hospital. Continues to be hemodynamically stable.  Troponin EKG clinical history reviewed with Dr. Chancy Milroy of cardiology. He advises aspirin, heparin, and conservative management. He advises the patient is not a candidate for acute cardiac catheterization, and I would agree. Admitting to the hospitalist service. ____________________________________________   FINAL CLINICAL IMPRESSION(S) / ED DIAGNOSES  Final diagnoses:  Abdominal pain  NSTEMI (non-ST elevated myocardial infarction) ([place])  Left lower lobe pneumonia      Delman Kitten, MD 03/14/2015 [place], MD 02/28/2015 [phone]

## 2015-03-05 NOTE — Progress Notes (Addendum)
ANTICOAGULATION CONSULT NOTE - Initial Consult  Pharmacy Consult for Heparin  Indication: chest pain/ACS  Allergies  Allergen Reactions  . Penicillins Rash    Patient Measurements: Height: 5\' 5"  (165.1 cm) Weight: 147 lb 9.6 oz (66.951 kg) IBW/kg (Calculated) : 57 Heparin Dosing Weight: 68 kg   Vital Signs: Temp: 98.7 F (37.1 C) (01/21 1235) Temp Source: Oral (01/21 1235) BP: 162/96 mmHg (01/21 1400) Pulse Rate: 57 (01/21 1400)  Labs:  Recent Labs  02/18/2015 1300  HGB 13.7  HCT 41.6  PLT 232  LABPROT 17.8*  INR 1.46  CREATININE 2.26*  TROPONINI 5.55*    Estimated Creatinine Clearance: 19.1 mL/min (by C-G formula based on Cr of 2.26).   Medical History: Past Medical History  Diagnosis Date  . Cancer [facility])     right breast cancer  . S/P radiation therapy     right breast cancer    Medications:   (Not in a hospital admission)  Assessment: Pharmacy consulted to dose heparin for ACS in this 75 year old female. CrCl = 19.1 ml/min  No prior anticoag noted.  Goal of Therapy:  Heparin level 0.3-0.7 units/ml Monitor platelets by anticoagulation protocol: Yes   Plan:  Give 4000 units bolus x 1 Start heparin infusion at 800  units/hr  Will draw 1st HL 8 hrs after start of drip on 1/22 @ 0100.  Will order baseline aPTT.  Will monitor CBC daily.   Adith Tejada D 03/04/2015,4:21 PM

## 2015-03-05 NOTE — ED Notes (Signed)
Patient denies c/p n/v. ShOB+ with 2 failed rounds fo ABX and Prednisone

## 2015-03-06 ENCOUNTER — Inpatient Hospital Stay
Admit: 2015-03-06 | Discharge: 2015-03-06 | Disposition: A | Discharge status: Home / Self Care | Payer: Medicare (Managed Care) | Attending: Internal Medicine | Admitting: Internal Medicine

## 2015-03-06 ENCOUNTER — Inpatient Hospital Stay: Payer: Medicare (Managed Care)

## 2015-03-06 LAB — COMPREHENSIVE METABOLIC PANEL
ALBUMIN: 3.3 g/dL — AB (ref 3.5–5.0)
ALK PHOS: 114 U/L (ref 38–126)
ALT: 1148 U/L — AB (ref 14–54)
ALT: 1105 U/L — ABNORMAL HIGH (ref 14–54)
ANION GAP: 11 (ref 5–15)
ANION GAP: 6 (ref 5–15)
AST: 932 U/L — ABNORMAL HIGH (ref 15–41)
AST: 728 U/L — AB (ref 15–41)
Albumin: 3.4 g/dL — ABNORMAL LOW (ref 3.5–5.0)
Alkaline Phosphatase: 118 U/L (ref 38–126)
BILIRUBIN TOTAL: 1.1 mg/dL (ref 0.3–1.2)
BILIRUBIN TOTAL: 0.6 mg/dL (ref 0.3–1.2)
BUN: 55 mg/dL — ABNORMAL HIGH (ref 6–20)
BUN: 50 mg/dL — AB (ref 6–20)
CALCIUM: 7.8 mg/dL — AB (ref 8.9–10.3)
CO2: 31 mmol/L (ref 22–32)
CO2: 32 mmol/L (ref 22–32)
CREATININE: 1.7 mg/dL — AB (ref 0.44–1.00)
Calcium: 7.4 mg/dL — ABNORMAL LOW (ref 8.9–10.3)
Chloride: 98 mmol/L — ABNORMAL LOW (ref 101–111)
Chloride: 97 mmol/L — ABNORMAL LOW (ref 101–111)
Creatinine, Ser: 1.14 mg/dL — ABNORMAL HIGH (ref 0.44–1.00)
GFR calc Af Amer: 53 mL/min — ABNORMAL LOW (ref >60)
GFR calc non Af Amer: 46 mL/min — ABNORMAL LOW (ref >60)
GFR, EST AFRICAN AMERICAN: 33 mL/min — AB (ref >60)
GFR, EST NON AFRICAN AMERICAN: 28 mL/min — AB (ref >60)
GLUCOSE: 132 mg/dL — AB (ref 65–99)
Glucose, Bld: 124 mg/dL — ABNORMAL HIGH (ref 65–99)
POTASSIUM: 3.4 mmol/L — AB (ref 3.5–5.1)
Potassium: 3.2 mmol/L — ABNORMAL LOW (ref 3.5–5.1)
SODIUM: 135 mmol/L (ref 135–145)
Sodium: 140 mmol/L (ref 135–145)
TOTAL PROTEIN: 5.9 g/dL — AB (ref 6.5–8.1)
TOTAL PROTEIN: 6 g/dL — AB (ref 6.5–8.1)

## 2015-03-06 LAB — HEPATITIS PANEL, ACUTE
HCV Ab: <0.1 s/co ratio (ref 0.0–0.9)
HEP A IGM: NEGATIVE
HEP B C IGM: NEGATIVE
Hepatitis B Surface Ag: NEGATIVE

## 2015-03-06 LAB — URINALYSIS COMPLETE WITH MICROSCOPIC (ARMC ONLY)
BACTERIA UA: NONE SEEN
BILIRUBIN URINE: NEGATIVE
Glucose, UA: NEGATIVE mg/dL
KETONES UR: NEGATIVE mg/dL
LEUKOCYTES UA: NEGATIVE
NITRITE: NEGATIVE
PH: 5 (ref 5.0–8.0)
PROTEIN: NEGATIVE mg/dL
Specific Gravity, Urine: 1.018 (ref 1.005–1.030)

## 2015-03-06 LAB — HEPARIN LEVEL (UNFRACTIONATED)
HEPARIN UNFRACTIONATED: 0.53 [IU]/mL (ref 0.30–0.70)
Heparin Unfractionated: 0.15 IU/mL — ABNORMAL LOW (ref 0.30–0.70)
Heparin Unfractionated: 0.58 IU/mL (ref 0.30–0.70)

## 2015-03-06 LAB — CBC
HCT: 38.7 % (ref 35.0–47.0)
HEMOGLOBIN: 12.6 g/dL (ref 12.0–16.0)
MCH: 31.6 pg (ref 26.0–34.0)
MCHC: 32.6 g/dL (ref 32.0–36.0)
MCV: 96.7 fL (ref 80.0–100.0)
Platelets: 211 10*3/uL (ref 150–440)
RBC: 4 MIL/uL (ref 3.80–5.20)
RDW: 15 % — ABNORMAL HIGH (ref 11.5–14.5)
WBC: 11.9 10*3/uL — ABNORMAL HIGH (ref 3.6–11.0)

## 2015-03-06 LAB — CK: Total CK: 189 U/L (ref 38–234)

## 2015-03-06 LAB — TROPONIN I
Troponin I: 2.95 ng/mL — ABNORMAL HIGH (ref <0.031)
Troponin I: 2.46 ng/mL — ABNORMAL HIGH (ref <0.031)

## 2015-03-06 LAB — GLUCOSE, CAPILLARY: GLUCOSE-CAPILLARY: 98 mg/dL (ref 65–99)

## 2015-03-06 MED ORDER — HEPARIN BOLUS VIA INFUSION
2000.0000 [IU] | Freq: Once | INTRAVENOUS | Status: AC
  Administered 2015-03-06: 2000 [IU] via INTRAVENOUS
  Filled 2015-03-06: qty 2000

## 2015-03-06 MED ORDER — HEPARIN (PORCINE) IN NACL 100-0.45 UNIT/ML-% IJ SOLN
1000.0000 [IU]/h | INTRAMUSCULAR | Status: DC
  Filled 2015-03-06 (×5): qty 250

## 2015-03-06 MED ORDER — LEVOFLOXACIN IN D5W 250 MG/50ML IV SOLN
250.0000 mg | INTRAVENOUS | Status: DC
  Filled 2015-03-06: qty 50

## 2015-03-06 MED ORDER — NITROGLYCERIN 2 % TD OINT
0.5000 [in_us] | TOPICAL_OINTMENT | Freq: Four times a day (QID) | TRANSDERMAL | Status: DC
Start: 2015-03-06 — End: 2015-03-07

## 2015-03-06 MED ORDER — LEVOFLOXACIN IN D5W 500 MG/100ML IV SOLN
500.0000 mg | Freq: Once | INTRAVENOUS | Status: AC
  Administered 2015-03-06: 500 mg via INTRAVENOUS
  Filled 2015-03-06: qty 100

## 2015-03-06 MED ORDER — GUAIFENESIN ER 600 MG PO TB12
600.0000 mg | ORAL_TABLET | Freq: Two times a day (BID) | ORAL | Status: DC
  Administered 2015-03-06 – 2015-03-13 (×12): 600 mg via ORAL
  Filled 2015-03-06 (×12): qty 1

## 2015-03-06 NOTE — Consult Note (Signed)
[facility] CONSULT NOTE    Date: 03/06/2015                  Patient Name:  Julie Anthony  MRN: [id number]  DOB: Aug 06, 1940  Age / Sex: 75 y.o., female         PCP: [facility]                 Service Requesting Consult: Dr. Doy Hutching                 Reason for Consult: Acute renal failure            History of Present Illness: Patient is a 75 y.o. female with a PMHx of right breast cancer status post radiation therapy, hypertension, hyperlipidemia, COPD, hypothyroidism, seizure disorder who was admitted to [facility] on 03/03/2015 for evaluation of cough and shortness of breath.   She was recently treated by her primary care physician for pneumonia with Bactrim. When she presented to the emergency department she was found have persistent left lower lobe pneumonia and was also found to have acute renal failure. She also had an elevated troponin of 5.5.  It is unclear as to whether she has underlying chronic kidney disease is no other labs are available to me other than what's available for this admission. Presenting creatinine was 2.2 and is currently down to 1.7. Patient is a rather poor historian. Potassium was also low at 3.2. She also has significant LFT abnormalities with an AST of 932 and ALT of 1148. Troponins are currently trending down.   Medications: Outpatient medications: Prescriptions prior to admission  Medication Sig Dispense Refill Last Dose  . acetaminophen (TYLENOL ARTHRITIS PAIN) 650 MG CR tablet Take 1,300 mg by mouth 2 (two) times daily.   unknown  . albuterol (PROVENTIL HFA;VENTOLIN HFA) 108 (90 Base) MCG/ACT inhaler Inhale 2 puffs into the lungs See admin instructions. Inhale 2 puffs every 4 to 6 hours as needed for shortness of breath.   prn  . albuterol (PROVENTIL) (2.5 MG/3ML) 0.083% nebulizer solution Take 2.5 mg by nebulization 4 (four) times daily as needed for wheezing or shortness of breath (cough).   prn  . aspirin EC 81  MG tablet Take 81 mg by mouth at bedtime.   unknown  . atenolol (TENORMIN) 50 MG tablet Take 50 mg by mouth at bedtime.   unknown  . atorvastatin (LIPITOR) 20 MG tablet Take 20 mg by mouth at bedtime.   unknown  . azithromycin (ZITHROMAX) 250 MG tablet Take 250-500 mg by mouth See admin instructions. Take 2 tablets (500mg ) by mouth now, then 1 tablet by mouth every day x 4 days.   unknown  . calcium carbonate (TUMS) 500 MG chewable tablet Chew 1,000 mg by mouth every 2 (two) hours as needed for indigestion or heartburn. *Maximum 10 tablets a day*   unknown  . Carboxymethylcellul-Glycerin 0.5-0.9 % SOLN Apply 1-2 drops to eye 3 (three) times daily as needed (for dry eye.).   prn  . cetirizine (ZYRTEC) 10 MG tablet Take 10 mg by mouth at bedtime.   unknown  . Cholecalciferol 1000 units capsule Take 1,000 Units by mouth daily.   unknown  . diclofenac sodium (VOLTAREN) 1 % GEL Apply 2-4 g topically See admin instructions. Apply 2 grams to affected area above waist, and 4 grams to affected area below the waist up to 4 times as needed for pain.   unknown  . docusate sodium (  COLACE) 100 MG capsule Take 100 mg by mouth daily.   unknown  . DULoxetine (CYMBALTA) 60 MG capsule Take 60 mg by mouth daily.   unknown  . fluticasone (FLOVENT DISKUS) 50 MCG/BLIST diskus inhaler Inhale 2 puffs into the lungs 2 (two) times daily.   unknown  . levothyroxine (SYNTHROID, LEVOTHROID) 175 MCG tablet Take 175 mcg by mouth at bedtime.   unknown  . losartan-hydrochlorothiazide (HYZAAR) 100-25 MG tablet Take 1 tablet by mouth daily.   unknown  . phenytoin (DILANTIN) 100 MG ER capsule Take 300 mg by mouth at bedtime.   unknown  . [EXPIRED] predniSONE (DELTASONE) 20 MG tablet Take 40 mg by mouth daily. X 5 days.   unknown  . ranitidine (ZANTAC) 150 MG capsule Take 150 mg by mouth 2 (two) times daily.   unknown  . senna (SENOKOT) 8.6 MG tablet Take 2 tablets by mouth at bedtime.   unknown  . sodium chloride (NASAL MOIST) 0.65 %  nasal spray Place 2 sprays into the nose every 2 (two) hours as needed for congestion.   prn  . traZODone (DESYREL) 50 MG tablet Take 50 mg by mouth at bedtime.   unknown  . Verapamil HCl CR 300 MG CP24 Take 300 mg by mouth daily.   unknown    Current medications: Current Facility-Administered Medications  Medication Dose Route Frequency Provider Last Rate Last Dose  . 0.9 % NaCl with KCl 20 mEq/ L  infusion   Intravenous Continuous Idelle Crouch, MD 100 mL/hr at 02/19/2015 2035    . acetaminophen (TYLENOL) tablet 650 mg  650 mg Oral Q6H PRN Idelle Crouch, MD       Or  . acetaminophen (TYLENOL) suppository 650 mg  650 mg Rectal Q6H PRN Idelle Crouch, MD      . antiseptic oral rinse (CPC / CETYLPYRIDINIUM CHLORIDE 0.05%) solution 7 mL  7 mL Mouth Rinse BID Bettey Costa, MD   7 mL at 03/06/15 1000  . aspirin EC tablet 81 mg  81 mg Oral Daily Idelle Crouch, MD   81 mg at 03/06/15 [id number]  . atenolol (TENORMIN) tablet 50 mg  50 mg Oral QHS Idelle Crouch, MD   50 mg at 02/23/2015 2154  . atorvastatin (LIPITOR) tablet 20 mg  20 mg Oral QHS Idelle Crouch, MD   20 mg at 03/15/2015 2153  . aztreonam (AZACTAM) 1 g in dextrose 5 % 50 mL IVPB  1 g Intravenous 3 times per day Bettey Costa, MD   1 g at 03/06/15 1400  . bisacodyl (DULCOLAX) suppository 10 mg  10 mg Rectal Daily PRN Idelle Crouch, MD      . budesonide (PULMICORT) nebulizer solution 0.25 mg  0.25 mg Nebulization BID Bettey Costa, MD   0.25 mg at 03/06/15 0748  . calcium carbonate (TUMS - dosed in mg elemental calcium) chewable tablet 1,000 mg  1,000 mg Oral Q2H PRN Idelle Crouch, MD      . cholecalciferol (VITAMIN D) tablet 1,000 Units  1,000 Units Oral Daily Idelle Crouch, MD   1,000 Units at 03/06/15 0830  . docusate sodium (COLACE) capsule 100 mg  100 mg Oral BID Idelle Crouch, MD   100 mg at 03/06/15 0830  . DULoxetine (CYMBALTA) DR capsule 60 mg  60 mg Oral Daily Idelle Crouch, MD   60 mg at 03/06/15 0829  . famotidine  (PEPCID) tablet 20 mg  20 mg Oral BID Leonie Douglas  Sparks, MD   20 mg at 03/06/15 0830  . guaiFENesin (MUCINEX) 12 hr tablet 600 mg  600 mg Oral BID Theodoro Grist, MD      . heparin ADULT infusion 100 units/mL (25000 units/250 mL)  1,000 Units/hr Intravenous Continuous Bettey Costa, MD 10 mL/hr at 03/06/15 0336 1,000 Units/hr at 03/06/15 0336  . ipratropium-albuterol (DUONEB) 0.5-2.5 (3) MG/3ML nebulizer solution 3 mL  3 mL Nebulization QID Idelle Crouch, MD   3 mL at 03/06/15 1242  . [START ON 03/07/2015] Levofloxacin (LEVAQUIN) IVPB 250 mg  250 mg Intravenous Q24H Theodoro Grist, MD      . levofloxacin (LEVAQUIN) IVPB 500 mg  500 mg Intravenous Once Theodoro Grist, MD      . levothyroxine (SYNTHROID, LEVOTHROID) tablet 175 mcg  175 mcg Oral QHS Idelle Crouch, MD   175 mcg at 02/27/2015 2154  . loratadine (CLARITIN) tablet 10 mg  10 mg Oral Daily Idelle Crouch, MD   10 mg at 03/06/15 0830  . losartan (COZAAR) tablet 100 mg  100 mg Oral Daily Idelle Crouch, MD   100 mg at 03/06/15 0829  . morphine 2 MG/ML injection 2 mg  2 mg Intravenous Q2H PRN Idelle Crouch, MD   2 mg at 03/06/15 0012  . nitroGLYCERIN (NITROGLYN) 2 % ointment 0.5 inch  0.5 inch Topical 4 times per day Theodoro Grist, MD   0.5 inch at 03/06/15 1415  . ondansetron (ZOFRAN) tablet 4 mg  4 mg Oral Q6H PRN Idelle Crouch, MD       Or  . ondansetron [facility]) injection 4 mg  4 mg Intravenous Q6H PRN Idelle Crouch, MD      . phenytoin (DILANTIN) ER capsule 300 mg  300 mg Oral QHS Idelle Crouch, MD   300 mg at 03/01/2015 2154  . senna (SENOKOT) tablet 17.2 mg  2 tablet Oral QHS Idelle Crouch, MD   17.2 mg at 02/18/2015 2155  . sodium chloride 0.9 % injection 3 mL  3 mL Intravenous Q12H Idelle Crouch, MD   3 mL at 03/06/15 1000  . traZODone (DESYREL) tablet 50 mg  50 mg Oral QHS Idelle Crouch, MD   50 mg at 03/11/2015 2154  . vancomycin (VANCOCIN) IVPB 1000 mg/200 mL premix  1,000 mg Intravenous Q48H Sital Mody, MD   1,000  mg at 03/06/15 0829  . verapamil (CALAN-SR) 300 mg  300 mg Oral Daily Idelle Crouch, MD   300 mg at 03/06/15 1000      Allergies: Allergies  Allergen Reactions  . Penicillins Rash  . Codeine Other (See Comments)    Unknown reaction.  . Ceftin [Cefuroxime Axetil] Other (See Comments)    Unknown reaction  . Erythromycin Other (See Comments)    Unknown reaction  . Ibuprofen Other (See Comments)    Unknown reaction  . Sulfa Antibiotics Other (See Comments)    Unknown reaction      Past Medical History: Past Medical History  Diagnosis Date  . Cancer [facility])     right breast cancer  . S/P radiation therapy     right breast cancer     Past Surgical History: Past Surgical History  Procedure Laterality Date  . Abdominal hysterectomy    . Breast excisional biopsy Right     positive 2006  . Breast biopsy Left     negative 02/2012 stereotactic     Family History: Family History  Problem Relation Age  of Onset  . Breast cancer Neg Hx      Social History: Social History   Social History  . Marital Status: Single    Spouse Name: N/A  . Number of Children: N/A  . Years of Education: N/A   Occupational History  . Not on file.   Social History Main Topics  . Smoking status: Former Smoker    Quit date: 03/04/2010  . Smokeless tobacco: Not on file  . Alcohol Use: Not on file  . Drug Use: Not on file  . Sexual Activity: Not on file   Other Topics Concern  . Not on file   Social History Narrative     Review of Systems: Patient unable to focus on ROS questions.   Vital Signs: Blood pressure 132/76, pulse 96, temperature 98 F (36.7 C), temperature source Oral, resp. rate 18, height 5\' 5"  (1.651 m), weight 67.359 kg (148 lb 8 oz), SpO2 96 %.  Weight trends: Filed Weights   03/12/2015 1235 03/06/15 0525  Weight: 66.951 kg (147 lb 9.6 oz) 67.359 kg (148 lb 8 oz)    Physical Exam: General: Dissheveled, NAD  Head: Normocephalic, atraumatic.  Eyes:  Anicteric, EOMI  Nose: Mucous membranes moist, not inflammed, nonerythematous.  Throat: Oropharynx nonerythematous, no exudate appreciated.   Neck: Supple, trachea midline.  Lungs:  Left basilar rales, normal effort  Heart: S1S2 no rubs  Abdomen:  BS normoactive. Soft, Nondistended, non-tender.  No masses or organomegaly.  Extremities: No pretibial edema.  Neurologic: Awake and alert, follows simple commands  Skin: No visible rashes, scars.    Lab results: Basic Metabolic Panel:  Recent Labs Lab 02/21/2015 1300 03/06/15 0206  NA 141 140  K 3.1* 3.2*  CL 91* 98*  CO2 40* 31  GLUCOSE 120* 124*  BUN 54* 55*  CREATININE 2.26* 1.70*  CALCIUM 8.6* 7.8*    Liver Function Tests:  Recent Labs Lab 03/11/2015 1300 03/06/15 0206  AST 1143* 932*  ALT 1249* 1148*  ALKPHOS 133* 114  BILITOT 0.9 1.1  PROT 6.8 5.9*  ALBUMIN 3.9 3.4*   No results for input(s): LIPASE, AMYLASE in the last 168 hours. No results for input(s): AMMONIA in the last 168 hours.  CBC:  Recent Labs Lab 03/04/2015 1300 03/06/15 0206  WBC 12.1* 11.9*  NEUTROABS 10.4*  --   HGB 13.7 12.6  HCT 41.6 38.7  MCV 93.9 96.7  PLT 232 211    Cardiac Enzymes:  Recent Labs Lab 02/23/2015 1300 02/26/2015 2030 03/06/15 0206 03/06/15 0743  TROPONINI 5.55* 3.97* 2.95* 2.46*    BNP: Invalid input(s): POCBNP  CBG:  Recent Labs Lab 03/06/15 0729  GLUCAP 68    Microbiology: Results for orders placed or performed during the hospital encounter of 02/20/2015  Culture, blood (Routine X 2) w Reflex to ID Panel     Status: None (Preliminary result)   Collection Time: 03/11/2015  1:00 PM  Result Value Ref Range Status   Specimen Description BLOOD LEFT ANTECUBITAL  Final   Special Requests BOTTLES DRAWN AEROBIC AND ANAEROBIC  1CC  Final   Culture NO GROWTH < 24 HOURS  Final   Report Status PENDING  Incomplete  Culture, blood (Routine X 2) w Reflex to ID Panel     Status: None (Preliminary result)   Collection  Time: 03/14/2015  1:04 PM  Result Value Ref Range Status   Specimen Description BLOOD LEFT HAND  Final   Special Requests BOTTLES DRAWN AEROBIC AND ANAEROBIC  1CC  Final   Culture NO GROWTH < 24 HOURS  Final   Report Status PENDING  Incomplete    Coagulation Studies:  Recent Labs  02/21/2015 1300  LABPROT 17.8*  INR 1.46    Urinalysis: No results for input(s): COLORURINE, LABSPEC, PHURINE, GLUCOSEU, HGBUR, BILIRUBINUR, KETONESUR, PROTEINUR, UROBILINOGEN, NITRITE, LEUKOCYTESUR in the last 72 hours.  Invalid input(s): APPERANCEUR    Imaging: Dg Chest 2 View  02/27/2015  CLINICAL DATA:  Shortness of breath and productive cough EXAM: CHEST - 2 VIEW COMPARISON:  None. FINDINGS: Cardiac shadow is mildly enlarged. Thoracic aorta is tortuous. The right lung is clear. The left lung demonstrates mild infiltrate projecting in the left lower lobe posteriorly. No acute bony abnormality is noted. IMPRESSION: Left basilar infiltrate. Electronically Signed   By: Inez Catalina M.D.   On: 03/07/2015 13:53   Dg Chest Port 1 View  03/06/2015  CLINICAL DATA:  75 year old female with cough congestion and shortness of breath. Recently treated for pneumonia. Initial encounter. EXAM: PORTABLE CHEST 1 VIEW COMPARISON:  02/21/2015 FINDINGS: Portable AP upright view at 0843 hours. Worsening volume loss and opacification of the left lung, worst at the left lung base. Superimposed cardiomegaly, stable mediastinal contours. Lower lung volumes on the right without confluent opacity. No superimposed pneumothorax or large effusion. No acute pulmonary edema. Postoperative changes to the right axilla re- demonstrated. IMPRESSION: Worsening volume loss and opacification of the left lung, consider Mucous Plug and/or increased pneumonia. Electronically Signed   By: Genevie Ann M.D.   On: 03/06/2015 10:39   US Abdomen Limited Ruq  03/01/2015  CLINICAL DATA:  Abdominal pain EXAM: US ABDOMEN LIMITED - RIGHT UPPER QUADRANT COMPARISON:   None. FINDINGS: Gallbladder: Surgical absent Common bile duct: Diameter: 5.8 mm in diameter within normal limits. Liver: No focal lesion identified. Within normal limits in parenchymal echogenicity. There is atrophic right kidney measures 5.7 cm in length partially visualized. IMPRESSION: 1. Surgical absent gallbladder. Normal CBD. Unremarkable liver. Question right renal atrophy. Electronically Signed   By: Lahoma Crocker M.D.   On: 02/25/2015 15:33      Assessment & Plan: Pt is a 75 y.o. female with a PMHx of right breast cancer status post radiation therapy, hypertension, hyperlipidemia, COPD, hypothyroidism, seizure disorder who was admitted to [facility] on 02/16/2015 for evaluation of cough and shortness of breath.   1.  Acute renal failure.  Suspect related to concurrent illness with left lower lobe pneumonia. -  Baseline creatinine currently unknown. Proceed with renal ultrasound, SPEP, UPEP, urinalysis, urine protein to creatinine ratio, and ANA for initial workup. Continue 0.9 normal saline at 100 cc per hour and follow renal function tomorrow. Avoid nephrotoxins as possible. Vancomycin level will need to be watched.  May need to hold losartan but continue for now as creatinine is declining.  2. Hypokalemia. Serum potassium low at 3.2. Provide repletion with potassium chloride 30 mEq IV 1.  3. Bacterial pneumonia. Antibiotic coverage was certainly expanded upon admission. Patient currently on Levaquin and vancomycin as well as aztreonam. Consider ID consultation.  4. Thanks for consultation.  2.

## 2015-03-06 NOTE — Progress Notes (Signed)
ANTICOAGULATION CONSULT NOTE - Initial Consult  Pharmacy Consult for Heparin  Indication: chest pain/ACS  Allergies  Allergen Reactions  . Penicillins Rash  . Codeine Other (See Comments)    Unknown reaction.  . Ceftin [Cefuroxime Axetil] Other (See Comments)    Unknown reaction  . Erythromycin Other (See Comments)    Unknown reaction  . Ibuprofen Other (See Comments)    Unknown reaction  . Sulfa Antibiotics Other (See Comments)    Unknown reaction    Patient Measurements: Height: 5\' 5"  (165.1 cm) Weight: 147 lb 9.6 oz (66.951 kg) IBW/kg (Calculated) : 57 Heparin Dosing Weight: 68 kg   Vital Signs: Temp: 97.4 F (36.3 C) (01/21 1908) Temp Source: Oral (01/21 1908) BP: 168/58 mmHg (01/21 1908) Pulse Rate: 83 (01/21 2153)  Labs:  Recent Labs  02/20/2015 1300 03/07/2015 2030 03/06/15 0206  HGB 13.7  --  12.6  HCT 41.6  --  38.7  PLT 232  --  211  APTT 27  --   --   LABPROT 17.8*  --   --   INR 1.46  --   --   HEPARINUNFRC  --   --  0.15*  CREATININE 2.26*  --  1.70*  TROPONINI 5.55* 3.97*  --     Estimated Creatinine Clearance: 25.3 mL/min (by C-G formula based on Cr of 1.7).   Medical History: Past Medical History  Diagnosis Date  . Cancer [facility])     right breast cancer  . S/P radiation therapy     right breast cancer    Medications:  Prescriptions prior to admission  Medication Sig Dispense Refill Last Dose  . acetaminophen (TYLENOL ARTHRITIS PAIN) 650 MG CR tablet Take 1,300 mg by mouth 2 (two) times daily.   unknown  . albuterol (PROVENTIL HFA;VENTOLIN HFA) 108 (90 Base) MCG/ACT inhaler Inhale 2 puffs into the lungs See admin instructions. Inhale 2 puffs every 4 to 6 hours as needed for shortness of breath.   prn  . albuterol (PROVENTIL) (2.5 MG/3ML) 0.083% nebulizer solution Take 2.5 mg by nebulization 4 (four) times daily as needed for wheezing or shortness of breath (cough).   prn  . aspirin EC 81 MG tablet Take 81 mg by mouth at bedtime.   unknown   . atenolol (TENORMIN) 50 MG tablet Take 50 mg by mouth at bedtime.   unknown  . atorvastatin (LIPITOR) 20 MG tablet Take 20 mg by mouth at bedtime.   unknown  . azithromycin (ZITHROMAX) 250 MG tablet Take 250-500 mg by mouth See admin instructions. Take 2 tablets (500mg ) by mouth now, then 1 tablet by mouth every day x 4 days.   unknown  . calcium carbonate (TUMS) 500 MG chewable tablet Chew 1,000 mg by mouth every 2 (two) hours as needed for indigestion or heartburn. *Maximum 10 tablets a day*   unknown  . Carboxymethylcellul-Glycerin 0.5-0.9 % SOLN Apply 1-2 drops to eye 3 (three) times daily as needed (for dry eye.).   prn  . cetirizine (ZYRTEC) 10 MG tablet Take 10 mg by mouth at bedtime.   unknown  . Cholecalciferol 1000 units capsule Take 1,000 Units by mouth daily.   unknown  . diclofenac sodium (VOLTAREN) 1 % GEL Apply 2-4 g topically See admin instructions. Apply 2 grams to affected area above waist, and 4 grams to affected area below the waist up to 4 times as needed for pain.   unknown  . docusate sodium (COLACE) 100 MG capsule Take 100 mg by  mouth daily.   unknown  . DULoxetine (CYMBALTA) 60 MG capsule Take 60 mg by mouth daily.   unknown  . fluticasone (FLOVENT DISKUS) 50 MCG/BLIST diskus inhaler Inhale 2 puffs into the lungs 2 (two) times daily.   unknown  . levothyroxine (SYNTHROID, LEVOTHROID) 175 MCG tablet Take 175 mcg by mouth at bedtime.   unknown  . losartan-hydrochlorothiazide (HYZAAR) 100-25 MG tablet Take 1 tablet by mouth daily.   unknown  . phenytoin (DILANTIN) 100 MG ER capsule Take 300 mg by mouth at bedtime.   unknown  . [EXPIRED] predniSONE (DELTASONE) 20 MG tablet Take 40 mg by mouth daily. X 5 days.   unknown  . ranitidine (ZANTAC) 150 MG capsule Take 150 mg by mouth 2 (two) times daily.   unknown  . senna (SENOKOT) 8.6 MG tablet Take 2 tablets by mouth at bedtime.   unknown  . sodium chloride (NASAL MOIST) 0.65 % nasal spray Place 2 sprays into the nose every 2  (two) hours as needed for congestion.   prn  . traZODone (DESYREL) 50 MG tablet Take 50 mg by mouth at bedtime.   unknown  . Verapamil HCl CR 300 MG CP24 Take 300 mg by mouth daily.   unknown    Assessment: Pharmacy consulted to dose heparin for ACS in this 75 year old female. CrCl = 19.1 ml/min  No prior anticoag noted.  Goal of Therapy:  Heparin level 0.3-0.7 units/ml Monitor platelets by anticoagulation protocol: Yes   Plan:  Heparin level subtherapeutic. 2000 unit IV x 1 bolus and increase rate to 1000 units/hr. Will recheck in 8 hours.   Laural Benes, Pharm.D., BCPS Clinical Pharmacist 03/06/2015,3:24 AM

## 2015-03-06 NOTE — Progress Notes (Signed)
A & O but forgetful at times. Impulsive. 2 L of oxygen. NSR. Takes meds ok. Pt reports no pain. UA sent. Heparin drip at 10. EF 60%. Pt has no further concerns at this time.

## 2015-03-06 NOTE — Progress Notes (Signed)
Clinical Education officer, museum (CSW) received SNF consult. PT is recommending home health. Patient is followed by [facility]. Please reconsult if future social work needs arise. CSW signing off.   Blima Rich, LCSW [phone]

## 2015-03-06 NOTE — Progress Notes (Signed)
[facility] [place] NAME: Julie Anthony    MR#:  [id number]  DATE OF BIRTH:  [birth year]  SUBJECTIVE:  CHIEF COMPLAINT:   Chief Complaint  Patient presents with  . Dysphagia  . Cough   the patient is a 75 year old Caucasian female with history of dementia, coronary artery disease, COPD, asthma, chronic respiratory failure on 2 L of oxygen through nasal cannula at home, who lives alone presents to the hospital with complaints of cough, chest congestion as well as shortness of breath. In emergency room, she was noted to have elevated troponin to 5.5, but had no chest pain. Patient's chest x-ray was concerning for left basilar infiltrate, repeat a chest x-ray today revealed worsening pneumonia versus atelectasis , concerning for mucous plugging. Discussed with Dr. Jamal Collin, pulmonologist, who recommended further valve. Patient remains on 2 L of oxygen through nasal cannula. Cardiologist does not recommend any interventions, but conservative therapy.    Review of Systems  Constitutional: Negative for fever, chills and weight loss.  HENT: Negative for congestion.   Eyes: Negative for blurred vision and double vision.  Respiratory: Positive for cough. Negative for hemoptysis, sputum production, shortness of breath and wheezing.   Cardiovascular: Negative for chest pain, palpitations, orthopnea, leg swelling and PND.  Gastrointestinal: Negative for nausea, vomiting, abdominal pain, diarrhea, constipation and blood in stool.  Genitourinary: Negative for dysuria, urgency, frequency and hematuria.  Musculoskeletal: Negative for falls.  Neurological: Negative for dizziness, tremors, focal weakness and headaches.  Endo/Heme/Allergies: Does not bruise/bleed easily.  Psychiatric/Behavioral: Negative for depression. The patient does not have insomnia.     VITAL SIGNS: Blood pressure 132/76, pulse 96, temperature 98 F (36.7 C), temperature source Oral,  resp. rate 18, height 5\' 5"  (1.651 m), weight 67.359 kg (148 lb 8 oz), SpO2 96 %.  PHYSICAL EXAMINATION:   GENERAL:  75 y.o.-year-old patient lying in the bed with no acute distress.  EYES: Pupils equal, round, reactive to light and accommodation. No scleral icterus. Extraocular muscles intact.  HEENT: Head atraumatic, normocephalic. Oropharynx and nasopharynx clear.  NECK:  Supple, no jugular venous distention. No thyroid enlargement, no tenderness.  LUNGS:Diminished breath sounds on the right, Good air entrance on the left , rales,rhonchi  were noted bilaterally . No use of accessory muscles of respiration. Kyphosis  CARDIOVASCULAR: S1, S2 , bradycardic, regular. No murmurs, rubs, or gallops.  ABDOMEN: Soft, nontender, nondistended. Bowel sounds present. No organomegaly or mass.  EXTREMITIES: No pedal edema, cyanosis, or clubbing.  NEUROLOGIC: Cranial nerves II through XII are intact. Muscle strength 5/5 in all extremities. Sensation intact. Gait not checked.  PSYCHIATRIC: The patient is alert and oriented x 3.  SKIN: No obvious rash, lesion, or ulcer.   ORDERS/RESULTS REVIEWED:   CBC  Recent Labs Lab 02/16/2015 1300 03/06/15 0206  WBC 12.1* 11.9*  HGB 13.7 12.6  HCT 41.6 38.7  PLT 232 211  MCV 93.9 96.7  MCH 31.0 31.6  MCHC 33.0 32.6  RDW 14.7* 15.0*  LYMPHSABS 0.8*  --   MONOABS 0.9  --   EOSABS 0.0  --   BASOSABS 0.0  --    ------------------------------------------------------------------------------------------------------------------  Chemistries   Recent Labs Lab 02/25/2015 1300 03/06/15 0206  NA 141 140  K 3.1* 3.2*  CL 91* 98*  CO2 40* 31  GLUCOSE 120* 124*  BUN 54* 55*  CREATININE 2.26* 1.70*  CALCIUM 8.6* 7.8*  AST 1143* 932*  ALT 1249* 1148*  ALKPHOS 133* 114  BILITOT 0.9 1.1   ------------------------------------------------------------------------------------------------------------------ estimated creatinine clearance is 25.3 mL/min (by C-G  formula based on Cr of 1.7). ------------------------------------------------------------------------------------------------------------------ No results for input(s): TSH, T4TOTAL, T3FREE, THYROIDAB in the last 72 hours.  Invalid input(s): FREET3  Cardiac Enzymes  Recent Labs Lab 03/15/2015 2030 03/06/15 0206 03/06/15 0743  TROPONINI 3.97* 2.95* 2.46*   ------------------------------------------------------------------------------------------------------------------ Invalid input(s): POCBNP ---------------------------------------------------------------------------------------------------------------  RADIOLOGY: Dg Chest 2 View  02/23/2015  CLINICAL DATA:  Shortness of breath and productive cough EXAM: CHEST - 2 VIEW COMPARISON:  None. FINDINGS: Cardiac shadow is mildly enlarged. Thoracic aorta is tortuous. The right lung is clear. The left lung demonstrates mild infiltrate projecting in the left lower lobe posteriorly. No acute bony abnormality is noted. IMPRESSION: Left basilar infiltrate. Electronically Signed   By: Inez Catalina M.D.   On: 03/08/2015 13:53   Dg Chest Port 1 View  03/06/2015  CLINICAL DATA:  75 year old female with cough congestion and shortness of breath. Recently treated for pneumonia. Initial encounter. EXAM: PORTABLE CHEST 1 VIEW COMPARISON:  03/10/2015 FINDINGS: Portable AP upright view at 0843 hours. Worsening volume loss and opacification of the left lung, worst at the left lung base. Superimposed cardiomegaly, stable mediastinal contours. Lower lung volumes on the right without confluent opacity. No superimposed pneumothorax or large effusion. No acute pulmonary edema. Postoperative changes to the right axilla re- demonstrated. IMPRESSION: Worsening volume loss and opacification of the left lung, consider Mucous Plug and/or increased pneumonia. Electronically Signed   By: Genevie Ann M.D.   On: 03/06/2015 10:39   US Abdomen Limited Ruq  03/04/2015  CLINICAL DATA:   Abdominal pain EXAM: US ABDOMEN LIMITED - RIGHT UPPER QUADRANT COMPARISON:  None. FINDINGS: Gallbladder: Surgical absent Common bile duct: Diameter: 5.8 mm in diameter within normal limits. Liver: No focal lesion identified. Within normal limits in parenchymal echogenicity. There is atrophic right kidney measures 5.7 cm in length partially visualized. IMPRESSION: 1. Surgical absent gallbladder. Normal CBD. Unremarkable liver. Question right renal atrophy. Electronically Signed   By: Lahoma Crocker M.D.   On: 03/14/2015 15:33    EKG:  Orders placed or performed during the hospital encounter of 03/04/2015  . ED EKG  . ED EKG    ASSESSMENT AND PLAN:  Principal Problem:   NSTEMI (non-ST elevated myocardial infarction) ([place]) Active Problems:   HCAP (healthcare-associated pneumonia)   Abnormal LFTs   ARF (acute renal failure) ([place]) 1. Acute on chronic respiratory failure with hypoxia due to left lower lobe pneumonia, concerning for worsening pneumonia versus mucus plugging, initiating flutter valve, discussed with the pulmonologist, continue oxygen therapy as needed nebulizers as well as antibiotics 2. Left lower lobe pneumonia, continue aztreonam as well as vancomycin, add levofloxacin, get sputum cultures if possible. Add Humibid.  3. Acute renal failure, improving with IV fluid administration, getting urinalysis 4. Leukocytosis, stable 5. Non-Q-wave MI, likely due to demand ischemia. Per cardiologist, continue aspirin, heparin and nitrates, also on atenolol   Management plans discussed with the patient, family and they are in agreement.   DRUG ALLERGIES:  Allergies  Allergen Reactions  . Penicillins Rash  . Codeine Other (See Comments)    Unknown reaction.  . Ceftin [Cefuroxime Axetil] Other (See Comments)    Unknown reaction  . Erythromycin Other (See Comments)    Unknown reaction  . Ibuprofen Other (See Comments)    Unknown reaction  . Sulfa Antibiotics Other (See Comments)     Unknown reaction    CODE STATUS:     Code Status  Orders        Start     Ordered   02/20/2015 1930  Full code   Continuous     03/06/2015 1929    Code Status History    Date Active Date Inactive Code Status Order ID Comments User Context   This patient has a current code status but no historical code status.      TOTAL  critical care TIME TAKING CARE OF THIS PATIENT: 40  minutes.   Discussed with pulmonologist  Darl Brisbin M.D on 03/06/2015 at 1:56 PM  Between 7am to 6pm - Pager - [phone]  After 6pm go to [email or web address] - password [email or web address] [facility]  Office  [phone]  CC: Primary care physician; [place]

## 2015-03-06 NOTE — Progress Notes (Signed)
ANTICOAGULATION CONSULT NOTE - FOLLOW UP Pharmacy Consult for Heparin  Indication: chest pain/ACS  Allergies  Allergen Reactions  . Penicillins Rash  . Codeine Other (See Comments)    Unknown reaction.  . Ceftin [Cefuroxime Axetil] Other (See Comments)    Unknown reaction  . Erythromycin Other (See Comments)    Unknown reaction  . Ibuprofen Other (See Comments)    Unknown reaction  . Sulfa Antibiotics Other (See Comments)    Unknown reaction    Patient Measurements: Height: 5\' 5"  (165.1 cm) Weight: 148 lb 8 oz (67.359 kg) IBW/kg (Calculated) : 57 Heparin Dosing Weight: 68 kg   Vital Signs: Temp: 98 F (36.7 C) (01/22 1242) Temp Source: Oral (01/22 1242) BP: 132/76 mmHg (01/22 1242) Pulse Rate: 96 (01/22 1242)  Labs:  Recent Labs  03/12/2015 1300 03/08/2015 2030 03/06/15 0206 03/06/15 0743 03/06/15 1215  HGB 13.7  --  12.6  --   --   HCT 41.6  --  38.7  --   --   PLT 232  --  211  --   --   APTT 27  --   --   --   --   LABPROT 17.8*  --   --   --   --   INR 1.46  --   --   --   --   HEPARINUNFRC  --   --  0.15*  --  0.53  CREATININE 2.26*  --  1.70*  --   --   TROPONINI 5.55* 3.97* 2.95* 2.46*  --     Estimated Creatinine Clearance: 25.3 mL/min (by C-G formula based on Cr of 1.7).   Medical History: Past Medical History  Diagnosis Date  . Cancer [facility])     right breast cancer  . S/P radiation therapy     right breast cancer    Medications:  Prescriptions prior to admission  Medication Sig Dispense Refill Last Dose  . acetaminophen (TYLENOL ARTHRITIS PAIN) 650 MG CR tablet Take 1,300 mg by mouth 2 (two) times daily.   unknown  . albuterol (PROVENTIL HFA;VENTOLIN HFA) 108 (90 Base) MCG/ACT inhaler Inhale 2 puffs into the lungs See admin instructions. Inhale 2 puffs every 4 to 6 hours as needed for shortness of breath.   prn  . albuterol (PROVENTIL) (2.5 MG/3ML) 0.083% nebulizer solution Take 2.5 mg by nebulization 4 (four) times daily as needed for  wheezing or shortness of breath (cough).   prn  . aspirin EC 81 MG tablet Take 81 mg by mouth at bedtime.   unknown  . atenolol (TENORMIN) 50 MG tablet Take 50 mg by mouth at bedtime.   unknown  . atorvastatin (LIPITOR) 20 MG tablet Take 20 mg by mouth at bedtime.   unknown  . azithromycin (ZITHROMAX) 250 MG tablet Take 250-500 mg by mouth See admin instructions. Take 2 tablets (500mg ) by mouth now, then 1 tablet by mouth every day x 4 days.   unknown  . calcium carbonate (TUMS) 500 MG chewable tablet Chew 1,000 mg by mouth every 2 (two) hours as needed for indigestion or heartburn. *Maximum 10 tablets a day*   unknown  . Carboxymethylcellul-Glycerin 0.5-0.9 % SOLN Apply 1-2 drops to eye 3 (three) times daily as needed (for dry eye.).   prn  . cetirizine (ZYRTEC) 10 MG tablet Take 10 mg by mouth at bedtime.   unknown  . Cholecalciferol 1000 units capsule Take 1,000 Units by mouth daily.   unknown  . diclofenac sodium (  VOLTAREN) 1 % GEL Apply 2-4 g topically See admin instructions. Apply 2 grams to affected area above waist, and 4 grams to affected area below the waist up to 4 times as needed for pain.   unknown  . docusate sodium (COLACE) 100 MG capsule Take 100 mg by mouth daily.   unknown  . DULoxetine (CYMBALTA) 60 MG capsule Take 60 mg by mouth daily.   unknown  . fluticasone (FLOVENT DISKUS) 50 MCG/BLIST diskus inhaler Inhale 2 puffs into the lungs 2 (two) times daily.   unknown  . levothyroxine (SYNTHROID, LEVOTHROID) 175 MCG tablet Take 175 mcg by mouth at bedtime.   unknown  . losartan-hydrochlorothiazide (HYZAAR) 100-25 MG tablet Take 1 tablet by mouth daily.   unknown  . phenytoin (DILANTIN) 100 MG ER capsule Take 300 mg by mouth at bedtime.   unknown  . [EXPIRED] predniSONE (DELTASONE) 20 MG tablet Take 40 mg by mouth daily. X 5 days.   unknown  . ranitidine (ZANTAC) 150 MG capsule Take 150 mg by mouth 2 (two) times daily.   unknown  . senna (SENOKOT) 8.6 MG tablet Take 2 tablets by  mouth at bedtime.   unknown  . sodium chloride (NASAL MOIST) 0.65 % nasal spray Place 2 sprays into the nose every 2 (two) hours as needed for congestion.   prn  . traZODone (DESYREL) 50 MG tablet Take 50 mg by mouth at bedtime.   unknown  . Verapamil HCl CR 300 MG CP24 Take 300 mg by mouth daily.   unknown    Assessment: Pharmacy consulted to dose heparin for ACS in this 75 year old female. CrCl = 19.1 ml/min  No prior anticoag noted.  Goal of Therapy:  Heparin level 0.3-0.7 units/ml Monitor platelets by anticoagulation protocol: Yes   Plan:  Heparin level subtherapeutic. 2000 unit IV x 1 bolus and increase rate to 1000 units/hr. Will recheck in 8 hours.  1/22 @ 12:15 HL = 0.53.  Continue current drip rate.  Recheck HL 1/22 at 20:15.   [facility] Pharmacist 03/06/2015,12:56 PM

## 2015-03-06 NOTE — Progress Notes (Signed)
MONCHEL ATALLA is a 75 y.o. female  [id number]  Primary Cardiologist: Neoma Laming Reason for Consultation: Elevated troponin  HPI: This is a 75 year old white female with a past medical history of breast cancer status post radiation dementia presented to the emergency room with cough and shortness of breath. I was asked to evaluate the patient because of elevated troponin but actually is having left lower lobe pneumonia along with very elevated liver function tests. Her troponin level peak was 5.5 but is coming down and she has no chest pain. Review of Systems: No chest pain but is short of breath and chronic confused with history of dementia. Patient does not appears to be in any distress   Past Medical History  Diagnosis Date  . Cancer [facility])     right breast cancer  . S/P radiation therapy     right breast cancer    Medications Prior to Admission  Medication Sig Dispense Refill  . acetaminophen (TYLENOL ARTHRITIS PAIN) 650 MG CR tablet Take 1,300 mg by mouth 2 (two) times daily.    Marland Kitchen albuterol (PROVENTIL HFA;VENTOLIN HFA) 108 (90 Base) MCG/ACT inhaler Inhale 2 puffs into the lungs See admin instructions. Inhale 2 puffs every 4 to 6 hours as needed for shortness of breath.    Marland Kitchen albuterol (PROVENTIL) (2.5 MG/3ML) 0.083% nebulizer solution Take 2.5 mg by nebulization 4 (four) times daily as needed for wheezing or shortness of breath (cough).    Marland Kitchen aspirin EC 81 MG tablet Take 81 mg by mouth at bedtime.    Marland Kitchen atenolol (TENORMIN) 50 MG tablet Take 50 mg by mouth at bedtime.    Marland Kitchen atorvastatin (LIPITOR) 20 MG tablet Take 20 mg by mouth at bedtime.    Marland Kitchen azithromycin (ZITHROMAX) 250 MG tablet Take 250-500 mg by mouth See admin instructions. Take 2 tablets (500mg ) by mouth now, then 1 tablet by mouth every day x 4 days.    . calcium carbonate (TUMS) 500 MG chewable tablet Chew 1,000 mg by mouth every 2 (two) hours as needed for indigestion or heartburn. *Maximum 10 tablets a day*    .  Carboxymethylcellul-Glycerin 0.5-0.9 % SOLN Apply 1-2 drops to eye 3 (three) times daily as needed (for dry eye.).    Marland Kitchen cetirizine (ZYRTEC) 10 MG tablet Take 10 mg by mouth at bedtime.    . Cholecalciferol 1000 units capsule Take 1,000 Units by mouth daily.    . diclofenac sodium (VOLTAREN) 1 % GEL Apply 2-4 g topically See admin instructions. Apply 2 grams to affected area above waist, and 4 grams to affected area below the waist up to 4 times as needed for pain.    Marland Kitchen docusate sodium (COLACE) 100 MG capsule Take 100 mg by mouth daily.    . DULoxetine (CYMBALTA) 60 MG capsule Take 60 mg by mouth daily.    . fluticasone (FLOVENT DISKUS) 50 MCG/BLIST diskus inhaler Inhale 2 puffs into the lungs 2 (two) times daily.    Marland Kitchen levothyroxine (SYNTHROID, LEVOTHROID) 175 MCG tablet Take 175 mcg by mouth at bedtime.    Marland Kitchen losartan-hydrochlorothiazide (HYZAAR) 100-25 MG tablet Take 1 tablet by mouth daily.    . phenytoin (DILANTIN) 100 MG ER capsule Take 300 mg by mouth at bedtime.    . [EXPIRED] predniSONE (DELTASONE) 20 MG tablet Take 40 mg by mouth daily. X 5 days.    . ranitidine (ZANTAC) 150 MG capsule Take 150 mg by mouth 2 (two) times daily.    Marland Kitchen senna (  SENOKOT) 8.6 MG tablet Take 2 tablets by mouth at bedtime.    . sodium chloride (NASAL MOIST) 0.65 % nasal spray Place 2 sprays into the nose every 2 (two) hours as needed for congestion.    . traZODone (DESYREL) 50 MG tablet Take 50 mg by mouth at bedtime.    . Verapamil HCl CR 300 MG CP24 Take 300 mg by mouth daily.       Marland Kitchen antiseptic oral rinse  7 mL Mouth Rinse BID  . aspirin EC  81 mg Oral Daily  . atenolol  50 mg Oral QHS  . atorvastatin  20 mg Oral QHS  . aztreonam  1 g Intravenous 3 times per day  . budesonide (PULMICORT) nebulizer solution  0.25 mg Nebulization BID  . cholecalciferol  1,000 Units Oral Daily  . docusate sodium  100 mg Oral BID  . DULoxetine  60 mg Oral Daily  . famotidine  20 mg Oral BID  . ipratropium-albuterol  3 mL  Nebulization QID  . levothyroxine  175 mcg Oral QHS  . loratadine  10 mg Oral Daily  . losartan  100 mg Oral Daily  . phenytoin  300 mg Oral QHS  . senna  2 tablet Oral QHS  . sodium chloride  3 mL Intravenous Q12H  . traZODone  50 mg Oral QHS  . vancomycin  1,000 mg Intravenous Q48H  . verapamil (CALAN-SR) 300 mg  300 mg Oral Daily    Infusions: . 0.9 % NaCl with KCl 20 mEq / L 100 mL/hr at 03/09/2015 2035  . heparin 1,000 Units/hr (03/06/15 0336)    Allergies  Allergen Reactions  . Penicillins Rash  . Codeine Other (See Comments)    Unknown reaction.  . Ceftin [Cefuroxime Axetil] Other (See Comments)    Unknown reaction  . Erythromycin Other (See Comments)    Unknown reaction  . Ibuprofen Other (See Comments)    Unknown reaction  . Sulfa Antibiotics Other (See Comments)    Unknown reaction    Social History   Social History  . Marital Status: Single    Spouse Name: N/A  . Number of Children: N/A  . Years of Education: N/A   Occupational History  . Not on file.   Social History Main Topics  . Smoking status: Former Smoker    Quit date: 03/04/2010  . Smokeless tobacco: Not on file  . Alcohol Use: Not on file  . Drug Use: Not on file  . Sexual Activity: Not on file   Other Topics Concern  . Not on file   Social History Narrative    Family History  Problem Relation Age of Onset  . Breast cancer Neg Hx     PHYSICAL EXAM: Filed Vitals:   03/06/15 0525 03/06/15 0852  BP: 127/77 137/79  Pulse: 88 76  Temp: 98 F (36.7 C)   Resp: 18 18     Intake/Output Summary (Last 24 hours) at 03/06/15 1231 Last data filed at 03/06/15 0953  Gross per 24 hour  Intake      0 ml  Output      0 ml  Net      0 ml    General:  Well appearing. No respiratory difficulty HEENT: normal Neck: supple. no JVD. Carotids 2+ bilat; no bruits. No lymphadenopathy or thryomegaly appreciated. Cor: PMI nondisplaced. Regular rate & rhythm. No rubs, gallops or murmurs. Lungs:  clear Abdomen: soft, nontender, nondistended. No hepatosplenomegaly. No bruits or masses. Good bowel  sounds. Extremities: no cyanosis, clubbing, rash, edema Neuro: alert & oriented x 3, cranial nerves grossly intact. moves all 4 extremities w/o difficulty. Affect pleasant.  ECG: Sinus rhythm with diffuse ST and T changes  Results for orders placed or performed during the hospital encounter of 03/11/2015 (from the past 24 hour(s))  CBC with Differential     Status: Abnormal   Collection Time: 03/04/2015  1:00 PM  Result Value Ref Range   WBC 12.1 (H) 3.6 - 11.0 K/uL   RBC 4.43 3.80 - 5.20 MIL/uL   Hemoglobin 13.7 12.0 - 16.0 g/dL   HCT 41.6 35.0 - 47.0 %   MCV 93.9 80.0 - 100.0 fL   MCH 31.0 26.0 - 34.0 pg   MCHC 33.0 32.0 - 36.0 g/dL   RDW 14.7 (H) 11.5 - 14.5 %   Platelets 232 150 - 440 K/uL   Neutrophils Relative % 86 %   Neutro Abs 10.4 (H) 1.4 - 6.5 K/uL   Lymphocytes Relative 6 %   Lymphs Abs 0.8 (L) 1.0 - 3.6 K/uL   Monocytes Relative 8 %   Monocytes Absolute 0.9 0.2 - 0.9 K/uL   Eosinophils Relative 0 %   Eosinophils Absolute 0.0 0 - 0.7 K/uL   Basophils Relative 0 %   Basophils Absolute 0.0 0 - 0.1 K/uL  Comprehensive metabolic panel     Status: Abnormal   Collection Time: 03/04/2015  1:00 PM  Result Value Ref Range   Sodium 141 135 - 145 mmol/L   Potassium 3.1 (L) 3.5 - 5.1 mmol/L   Chloride 91 (L) 101 - 111 mmol/L   CO2 40 (H) 22 - 32 mmol/L   Glucose, Bld 120 (H) 65 - 99 mg/dL   BUN 54 (H) 6 - 20 mg/dL   Creatinine, Ser 2.26 (H) 0.44 - 1.00 mg/dL   Calcium 8.6 (L) 8.9 - 10.3 mg/dL   Total Protein 6.8 6.5 - 8.1 g/dL   Albumin 3.9 3.5 - 5.0 g/dL   AST 1143 (H) 15 - 41 U/L   ALT 1249 (H) 14 - 54 U/L   Alkaline Phosphatase 133 (H) 38 - 126 U/L   Total Bilirubin 0.9 0.3 - 1.2 mg/dL   GFR calc non Af Amer 20 (L) >60 mL/min   GFR calc Af Amer 23 (L) >60 mL/min   Anion gap 10 5 - 15  Culture, blood (Routine X 2) w Reflex to ID Panel     Status: None (Preliminary result)    Collection Time: 02/21/2015  1:00 PM  Result Value Ref Range   Specimen Description BLOOD LEFT ANTECUBITAL    Special Requests BOTTLES DRAWN AEROBIC AND ANAEROBIC  1CC    Culture NO GROWTH < 24 HOURS    Report Status PENDING   Acetaminophen level     Status: Abnormal   Collection Time: 03/02/2015  1:00 PM  Result Value Ref Range   Acetaminophen (Tylenol), Serum <10 (L) 10 - 30 ug/mL  Protime-INR     Status: Abnormal   Collection Time: 02/25/2015  1:00 PM  Result Value Ref Range   Prothrombin Time 17.8 (H) 11.4 - 15.0 seconds   INR 1.46   Troponin I     Status: Abnormal   Collection Time: 02/21/2015  1:00 PM  Result Value Ref Range   Troponin I 5.55 (H) <0.031 ng/mL  Hepatitis panel, acute     Status: None   Collection Time: 02/28/2015  1:00 PM  Result Value Ref Range   Hepatitis  B Surface Ag Negative Negative   HCV Ab <0.1 0.0 - 0.9 s/co ratio   Hep A IgM Negative Negative   Hep B C IgM Negative Negative  APTT     Status: None   Collection Time: 03/14/2015  1:00 PM  Result Value Ref Range   aPTT 27 24 - 36 seconds  Culture, blood (Routine X 2) w Reflex to ID Panel     Status: None (Preliminary result)   Collection Time: 02/25/2015  1:04 PM  Result Value Ref Range   Specimen Description BLOOD LEFT HAND    Special Requests BOTTLES DRAWN AEROBIC AND ANAEROBIC  1CC    Culture NO GROWTH < 24 HOURS    Report Status PENDING   Troponin I     Status: Abnormal   Collection Time: 02/14/2015  8:30 PM  Result Value Ref Range   Troponin I 3.97 (H) <0.031 ng/mL  Heparin level (unfractionated)     Status: Abnormal   Collection Time: 03/06/15  2:06 AM  Result Value Ref Range   Heparin Unfractionated 0.15 (L) 0.30 - 0.70 IU/mL  Troponin I     Status: Abnormal   Collection Time: 03/06/15  2:06 AM  Result Value Ref Range   Troponin I 2.95 (H) <0.031 ng/mL  CBC     Status: Abnormal   Collection Time: 03/06/15  2:06 AM  Result Value Ref Range   WBC 11.9 (H) 3.6 - 11.0 K/uL   RBC 4.00 3.80 - 5.20  MIL/uL   Hemoglobin 12.6 12.0 - 16.0 g/dL   HCT 38.7 35.0 - 47.0 %   MCV 96.7 80.0 - 100.0 fL   MCH 31.6 26.0 - 34.0 pg   MCHC 32.6 32.0 - 36.0 g/dL   RDW 15.0 (H) 11.5 - 14.5 %   Platelets 211 150 - 440 K/uL  Comprehensive metabolic panel     Status: Abnormal   Collection Time: 03/06/15  2:06 AM  Result Value Ref Range   Sodium 140 135 - 145 mmol/L   Potassium 3.2 (L) 3.5 - 5.1 mmol/L   Chloride 98 (L) 101 - 111 mmol/L   CO2 31 22 - 32 mmol/L   Glucose, Bld 124 (H) 65 - 99 mg/dL   BUN 55 (H) 6 - 20 mg/dL   Creatinine, Ser 1.70 (H) 0.44 - 1.00 mg/dL   Calcium 7.8 (L) 8.9 - 10.3 mg/dL   Total Protein 5.9 (L) 6.5 - 8.1 g/dL   Albumin 3.4 (L) 3.5 - 5.0 g/dL   AST 932 (H) 15 - 41 U/L   ALT 1148 (H) 14 - 54 U/L   Alkaline Phosphatase 114 38 - 126 U/L   Total Bilirubin 1.1 0.3 - 1.2 mg/dL   GFR calc non Af Amer 28 (L) >60 mL/min   GFR calc Af Amer 33 (L) >60 mL/min   Anion gap 11 5 - 15  Glucose, capillary     Status: None   Collection Time: 03/06/15  7:29 AM  Result Value Ref Range   Glucose-Capillary 98 65 - 99 mg/dL  Troponin I     Status: Abnormal   Collection Time: 03/06/15  7:43 AM  Result Value Ref Range   Troponin I 2.46 (H) <0.031 ng/mL   Dg Chest 2 View  03/10/2015  CLINICAL DATA:  Shortness of breath and productive cough EXAM: CHEST - 2 VIEW COMPARISON:  None. FINDINGS: Cardiac shadow is mildly enlarged. Thoracic aorta is tortuous. The right lung is clear. The left lung demonstrates mild  infiltrate projecting in the left lower lobe posteriorly. No acute bony abnormality is noted. IMPRESSION: Left basilar infiltrate. Electronically Signed   By: Inez Catalina M.D.   On: 03/11/2015 13:53   Dg Chest Port 1 View  03/06/2015  CLINICAL DATA:  74 year old female with cough congestion and shortness of breath. Recently treated for pneumonia. Initial encounter. EXAM: PORTABLE CHEST 1 VIEW COMPARISON:  03/14/2015 FINDINGS: Portable AP upright view at 0843 hours. Worsening volume loss  and opacification of the left lung, worst at the left lung base. Superimposed cardiomegaly, stable mediastinal contours. Lower lung volumes on the right without confluent opacity. No superimposed pneumothorax or large effusion. No acute pulmonary edema. Postoperative changes to the right axilla re- demonstrated. IMPRESSION: Worsening volume loss and opacification of the left lung, consider Mucous Plug and/or increased pneumonia. Electronically Signed   By: Genevie Ann M.D.   On: 03/06/2015 10:39   US Abdomen Limited Ruq  03/01/2015  CLINICAL DATA:  Abdominal pain EXAM: US ABDOMEN LIMITED - RIGHT UPPER QUADRANT COMPARISON:  None. FINDINGS: Gallbladder: Surgical absent Common bile duct: Diameter: 5.8 mm in diameter within normal limits. Liver: No focal lesion identified. Within normal limits in parenchymal echogenicity. There is atrophic right kidney measures 5.7 cm in length partially visualized. IMPRESSION: 1. Surgical absent gallbladder. Normal CBD. Unremarkable liver. Question right renal atrophy. Electronically Signed   By: Lahoma Crocker M.D.   On: 03/04/2015 15:33     ASSESSMENT AND PLAN: Elevated troponin most likely due to demand ischemia with no chest pain and nonspecific ST-T changes on EKG. Patient has history of dementia and underlying pneumonia along with elevated liver function tests. She may have cirrhosis of the liver and is not a candidate for invasive procedures. We will treat the patient conservatively. Advise aspirin heparin and nitrates. Also continue treatment of pneumonia.  Shanin Szymanowski A

## 2015-03-06 NOTE — Consult Note (Signed)
GI Inpatient Consult Note  Reason for Consult:Very high liver enzymes.   Attending Requesting Consult:  History of Present Illness: Julie Anthony is a 75 y.o. female with CAD, who was admitted with MI and pneumonia. Found to have AST/ALT >1000. Denies prior liver disease. No alcohol or tobacco use. Denies family hx. No CP or SOB. LFT already improving. Wants to go home already. Past Medical History:  Past Medical History  Diagnosis Date  . Cancer [facility])     right breast cancer  . S/P radiation therapy     right breast cancer    Problem List: Patient Active Problem List   Diagnosis Date Noted  . NSTEMI (non-ST elevated myocardial infarction) ([place]) 03/07/2015  . HCAP (healthcare-associated pneumonia) 03/02/2015  . Abnormal LFTs 02/19/2015  . ARF (acute renal failure) ([place]) 03/04/2015    Past Surgical History: Past Surgical History  Procedure Laterality Date  . Abdominal hysterectomy    . Breast excisional biopsy Right     positive 2006  . Breast biopsy Left     negative 02/2012 stereotactic    Allergies: Allergies  Allergen Reactions  . Penicillins Rash  . Codeine Other (See Comments)    Unknown reaction.  . Ceftin [Cefuroxime Axetil] Other (See Comments)    Unknown reaction  . Erythromycin Other (See Comments)    Unknown reaction  . Ibuprofen Other (See Comments)    Unknown reaction  . Sulfa Antibiotics Other (See Comments)    Unknown reaction    Home Medications: Prescriptions prior to admission  Medication Sig Dispense Refill Last Dose  . acetaminophen (TYLENOL ARTHRITIS PAIN) 650 MG CR tablet Take 1,300 mg by mouth 2 (two) times daily.   unknown  . albuterol (PROVENTIL HFA;VENTOLIN HFA) 108 (90 Base) MCG/ACT inhaler Inhale 2 puffs into the lungs See admin instructions. Inhale 2 puffs every 4 to 6 hours as needed for shortness of breath.   prn  . albuterol (PROVENTIL) (2.5 MG/3ML) 0.083% nebulizer solution Take 2.5 mg by nebulization 4 (four) times daily as  needed for wheezing or shortness of breath (cough).   prn  . aspirin EC 81 MG tablet Take 81 mg by mouth at bedtime.   unknown  . atenolol (TENORMIN) 50 MG tablet Take 50 mg by mouth at bedtime.   unknown  . atorvastatin (LIPITOR) 20 MG tablet Take 20 mg by mouth at bedtime.   unknown  . azithromycin (ZITHROMAX) 250 MG tablet Take 250-500 mg by mouth See admin instructions. Take 2 tablets (500mg ) by mouth now, then 1 tablet by mouth every day x 4 days.   unknown  . calcium carbonate (TUMS) 500 MG chewable tablet Chew 1,000 mg by mouth every 2 (two) hours as needed for indigestion or heartburn. *Maximum 10 tablets a day*   unknown  . Carboxymethylcellul-Glycerin 0.5-0.9 % SOLN Apply 1-2 drops to eye 3 (three) times daily as needed (for dry eye.).   prn  . cetirizine (ZYRTEC) 10 MG tablet Take 10 mg by mouth at bedtime.   unknown  . Cholecalciferol 1000 units capsule Take 1,000 Units by mouth daily.   unknown  . diclofenac sodium (VOLTAREN) 1 % GEL Apply 2-4 g topically See admin instructions. Apply 2 grams to affected area above waist, and 4 grams to affected area below the waist up to 4 times as needed for pain.   unknown  . docusate sodium (COLACE) 100 MG capsule Take 100 mg by mouth daily.   unknown  . DULoxetine (CYMBALTA) 60 MG  capsule Take 60 mg by mouth daily.   unknown  . fluticasone (FLOVENT DISKUS) 50 MCG/BLIST diskus inhaler Inhale 2 puffs into the lungs 2 (two) times daily.   unknown  . levothyroxine (SYNTHROID, LEVOTHROID) 175 MCG tablet Take 175 mcg by mouth at bedtime.   unknown  . losartan-hydrochlorothiazide (HYZAAR) 100-25 MG tablet Take 1 tablet by mouth daily.   unknown  . phenytoin (DILANTIN) 100 MG ER capsule Take 300 mg by mouth at bedtime.   unknown  . [EXPIRED] predniSONE (DELTASONE) 20 MG tablet Take 40 mg by mouth daily. X 5 days.   unknown  . ranitidine (ZANTAC) 150 MG capsule Take 150 mg by mouth 2 (two) times daily.   unknown  . senna (SENOKOT) 8.6 MG tablet Take 2  tablets by mouth at bedtime.   unknown  . sodium chloride (NASAL MOIST) 0.65 % nasal spray Place 2 sprays into the nose every 2 (two) hours as needed for congestion.   prn  . traZODone (DESYREL) 50 MG tablet Take 50 mg by mouth at bedtime.   unknown  . Verapamil HCl CR 300 MG CP24 Take 300 mg by mouth daily.   unknown   Home medication reconciliation was completed with the patient.   Scheduled Inpatient Medications:   . antiseptic oral rinse  7 mL Mouth Rinse BID  . aspirin EC  81 mg Oral Daily  . atenolol  50 mg Oral QHS  . atorvastatin  20 mg Oral QHS  . aztreonam  1 g Intravenous 3 times per day  . budesonide (PULMICORT) nebulizer solution  0.25 mg Nebulization BID  . cholecalciferol  1,000 Units Oral Daily  . docusate sodium  100 mg Oral BID  . DULoxetine  60 mg Oral Daily  . famotidine  20 mg Oral BID  . ipratropium-albuterol  3 mL Nebulization QID  . levothyroxine  175 mcg Oral QHS  . loratadine  10 mg Oral Daily  . losartan  100 mg Oral Daily  . phenytoin  300 mg Oral QHS  . senna  2 tablet Oral QHS  . sodium chloride  3 mL Intravenous Q12H  . traZODone  50 mg Oral QHS  . vancomycin  1,000 mg Intravenous Q48H  . verapamil (CALAN-SR) 300 mg  300 mg Oral Daily    Continuous Inpatient Infusions:   . 0.9 % NaCl with KCl 20 mEq / L 100 mL/hr at 03/04/2015 2035  . heparin 1,000 Units/hr (03/06/15 0336)    PRN Inpatient Medications:  acetaminophen **OR** acetaminophen, bisacodyl, calcium carbonate, morphine injection, ondansetron **OR** ondansetron (ZOFRAN) IV  Family History: family history is negative for Breast cancer.  The patient's family history is negative for inflammatory bowel disorders, GI malignancy, or solid organ transplantation.  Social History:   reports that she quit smoking about 5 years ago. She does not have any smokeless tobacco history on file. The patient denies ETOH, tobacco, or drug use.   Review of Systems: Constitutional: Weight is stable.   Eyes: No changes in vision. ENT: No oral lesions, sore throat.  GI: see HPI.  Heme/Lymph: No easy bruising.  CV: No chest pain.  GU: No hematuria.  Integumentary: No rashes.  Neuro: No headaches.  Psych: No depression/anxiety.  Endocrine: No heat/cold intolerance.  Allergic/Immunologic: No urticaria.  Resp: No cough, SOB.  Musculoskeletal: No joint swelling.    Physical Examination: BP 137/79 mmHg  Pulse 76  Temp(Src) 98 F (36.7 C) (Oral)  Resp 18  Ht 5\' 5"  (1.651 m)  Wt 67.359 kg (148 lb 8 oz)  BMI 24.71 kg/m2  SpO2 92% Gen: NAD, alert and oriented x 4 HEENT: PEERLA, EOMI, Neck: supple, no JVD or thyromegaly Chest: CTA bilaterally, no wheezes, crackles, or other adventitious sounds CV: RRR, no m/g/c/r Abd: soft, NT, ND, +BS in all four quadrants; no HSM, guarding, ridigity, or rebound tenderness Ext: no edema, well perfused with 2+ pulses, Skin: no rash or lesions noted Lymph: no LAD  Data: Lab Results  Component Value Date   WBC 11.9* 03/06/2015   HGB 12.6 03/06/2015   HCT 38.7 03/06/2015   MCV 96.7 03/06/2015   PLT 211 03/06/2015    Recent Labs Lab 02/13/2015 1300 03/06/15 0206  HGB 13.7 12.6   Lab Results  Component Value Date   NA 140 03/06/2015   K 3.2* 03/06/2015   CL 98* 03/06/2015   CO2 31 03/06/2015   BUN 55* 03/06/2015   CREATININE 1.70* 03/06/2015   Lab Results  Component Value Date   ALT 1148* 03/06/2015   AST 932* 03/06/2015   ALKPHOS 114 03/06/2015   BILITOT 1.1 03/06/2015    Recent Labs Lab 02/26/2015 1300  APTT 27  INR 1.46   Assessment/Plan: Ms. Piotter is a 75 y.o. female with shock liver, likely from ischemia/hypoxia. Already improving.    Recommendations: If pt has no underlying chronic liver disease, then expect elevated liver enzymes to resolve over time. Continue to moniter LFT for now. Will follow.  Thank you for the consult. Please call with questions or concerns.  Karelyn Brisby, Lupita Dawn, MD

## 2015-03-06 NOTE — Progress Notes (Signed)
ANTICOAGULATION CONSULT NOTE - FOLLOW UP Pharmacy Consult for Heparin  Indication: chest pain/ACS  Allergies  Allergen Reactions  . Penicillins Rash  . Codeine Other (See Comments)    Unknown reaction.  . Ceftin [Cefuroxime Axetil] Other (See Comments)    Unknown reaction  . Erythromycin Other (See Comments)    Unknown reaction  . Ibuprofen Other (See Comments)    Unknown reaction  . Sulfa Antibiotics Other (See Comments)    Unknown reaction    Patient Measurements: Height: 5\' 5"  (165.1 cm) Weight: 148 lb 8 oz (67.359 kg) IBW/kg (Calculated) : 57 Heparin Dosing Weight: 68 kg   Vital Signs: Temp: 97.8 F (36.6 C) (01/22 2034) Temp Source: Oral (01/22 2034) BP: 118/61 mmHg (01/22 2034) Pulse Rate: 76 (01/22 2109)  Labs:  Recent Labs  02/24/2015 1300 02/13/2015 2030 03/06/15 0206 03/06/15 0743 03/06/15 1215 03/06/15 2006  HGB 13.7  --  12.6  --   --   --   HCT 41.6  --  38.7  --   --   --   PLT 232  --  211  --   --   --   APTT 27  --   --   --   --   --   LABPROT 17.8*  --   --   --   --   --   INR 1.46  --   --   --   --   --   HEPARINUNFRC  --   --  0.15*  --  0.53 0.58  CREATININE 2.26*  --  1.70*  --   --  1.14*  CKTOTAL  --   --   --   --   --  189  TROPONINI 5.55* 3.97* 2.95* 2.46*  --   --     Estimated Creatinine Clearance: 37.8 mL/min (by C-G formula based on Cr of 1.14).   Medical History: Past Medical History  Diagnosis Date  . Cancer [facility])     right breast cancer  . S/P radiation therapy     right breast cancer    Medications:  Prescriptions prior to admission  Medication Sig Dispense Refill Last Dose  . acetaminophen (TYLENOL ARTHRITIS PAIN) 650 MG CR tablet Take 1,300 mg by mouth 2 (two) times daily.   unknown  . albuterol (PROVENTIL HFA;VENTOLIN HFA) 108 (90 Base) MCG/ACT inhaler Inhale 2 puffs into the lungs See admin instructions. Inhale 2 puffs every 4 to 6 hours as needed for shortness of breath.   prn  . albuterol (PROVENTIL) (2.5  MG/3ML) 0.083% nebulizer solution Take 2.5 mg by nebulization 4 (four) times daily as needed for wheezing or shortness of breath (cough).   prn  . aspirin EC 81 MG tablet Take 81 mg by mouth at bedtime.   unknown  . atenolol (TENORMIN) 50 MG tablet Take 50 mg by mouth at bedtime.   unknown  . atorvastatin (LIPITOR) 20 MG tablet Take 20 mg by mouth at bedtime.   unknown  . azithromycin (ZITHROMAX) 250 MG tablet Take 250-500 mg by mouth See admin instructions. Take 2 tablets (500mg ) by mouth now, then 1 tablet by mouth every day x 4 days.   unknown  . calcium carbonate (TUMS) 500 MG chewable tablet Chew 1,000 mg by mouth every 2 (two) hours as needed for indigestion or heartburn. *Maximum 10 tablets a day*   unknown  . Carboxymethylcellul-Glycerin 0.5-0.9 % SOLN Apply 1-2 drops to eye 3 (three) times daily as  needed (for dry eye.).   prn  . cetirizine (ZYRTEC) 10 MG tablet Take 10 mg by mouth at bedtime.   unknown  . Cholecalciferol 1000 units capsule Take 1,000 Units by mouth daily.   unknown  . diclofenac sodium (VOLTAREN) 1 % GEL Apply 2-4 g topically See admin instructions. Apply 2 grams to affected area above waist, and 4 grams to affected area below the waist up to 4 times as needed for pain.   unknown  . docusate sodium (COLACE) 100 MG capsule Take 100 mg by mouth daily.   unknown  . DULoxetine (CYMBALTA) 60 MG capsule Take 60 mg by mouth daily.   unknown  . fluticasone (FLOVENT DISKUS) 50 MCG/BLIST diskus inhaler Inhale 2 puffs into the lungs 2 (two) times daily.   unknown  . levothyroxine (SYNTHROID, LEVOTHROID) 175 MCG tablet Take 175 mcg by mouth at bedtime.   unknown  . losartan-hydrochlorothiazide (HYZAAR) 100-25 MG tablet Take 1 tablet by mouth daily.   unknown  . phenytoin (DILANTIN) 100 MG ER capsule Take 300 mg by mouth at bedtime.   unknown  . [EXPIRED] predniSONE (DELTASONE) 20 MG tablet Take 40 mg by mouth daily. X 5 days.   unknown  . ranitidine (ZANTAC) 150 MG capsule Take 150 mg  by mouth 2 (two) times daily.   unknown  . senna (SENOKOT) 8.6 MG tablet Take 2 tablets by mouth at bedtime.   unknown  . sodium chloride (NASAL MOIST) 0.65 % nasal spray Place 2 sprays into the nose every 2 (two) hours as needed for congestion.   prn  . traZODone (DESYREL) 50 MG tablet Take 50 mg by mouth at bedtime.   unknown  . Verapamil HCl CR 300 MG CP24 Take 300 mg by mouth daily.   unknown    Assessment: Pharmacy consulted to dose heparin for ACS in this 75 year old female. CrCl = 19.1 ml/min  No prior anticoag noted.  Goal of Therapy:  Heparin level 0.3-0.7 units/ml Monitor platelets by anticoagulation protocol: Yes   Plan:  Heparin level therapeutic. Continue current rate. Pharmacy will continue to monitor daily.   Laural Benes, Pharm.D., BCPS Clinical Pharmacist 03/06/2015,10:26 PM

## 2015-03-06 NOTE — Evaluation (Signed)
Physical Therapy Evaluation Patient Details Name: Julie Anthony MRN: [id number] DOB: 01/02/1941 Today's Date: 03/06/2015   History of Present Illness  Pt admitted for pneumonia and NSTEMI with peak troponin at 5.5. It has since decreased. Pt with history of dementia, CAD, COPD, OA, and breast cancer. Pt is active participant in [facility] and looks forward to returning there. Pt reports she wears chronic O2, however unsure how much she uses  Clinical Impression  Pt is a pleasant 75 year old female who was admitted for pneumonia and NSTEMI. Pt performs transfers with supervision and ambulation with RW and cga. Pt fatigues quickly and ask to sit and rest during ambulation. Pt explained she needed to turn back to her room. Pt not able to fully problem solve safely. Upon return to room, pt with O2 sats at 87% while on 2L of O2. Shortly, sats improved to 91%. No SOB symptoms noted.  Pt demonstrates deficits with endurance/strength. Pt states she is close to baseline level. Pt is not safe at home alone and needs 24/7 assist at this time.  She has great mobility, not needing much assist for movement, however is unsafe making her a high fall risk. Would benefit from skilled PT to address above deficits and promote optimal return to PLOF.      Follow Up Recommendations Home health PT;Supervision/Assistance - 24 hour (with [facility] program)    Equipment Recommendations       Recommendations for Other Services       Precautions / Restrictions Precautions Precautions: Fall Restrictions Weight Bearing Restrictions: No      Mobility  Bed Mobility               General bed mobility comments: not performed as pt received while seated in recliner  Transfers Overall transfer level: Needs assistance Equipment used: Rolling walker (2 wheeled) Transfers: Sit to/from Stand Sit to Stand: Supervision         General transfer comment: safe technique perfomed with pt pushing from seated  surface. Once standing, pt with kyphotic posture, unable to achieve upright stance. All mobility performed while on 2L of O2.  Ambulation/Gait Ambulation/Gait assistance: Min guard Ambulation Distance (Feet): 50 Feet Assistive device: Rolling walker (2 wheeled) Gait Pattern/deviations: Step-through pattern     General Gait Details: ambulated using rw and cga with forward flexed posture. Pt with increased gait speed that causes her to have difficulty turning walker, running into obstacles. CGA required for obstacle avoidance. Pt fatigues with limited exertion and reports she wants to rest.  Stairs            Wheelchair Mobility    Modified Rankin (Stroke Patients Only)       Balance Overall balance assessment: Needs assistance Sitting-balance support: Feet supported Sitting balance-Leahy Scale: Normal     Standing balance support: Bilateral upper extremity supported Standing balance-Leahy Scale: Fair                               Pertinent Vitals/Pain Pain Assessment: No/denies pain    Home Living Family/patient expects to be discharged to:: Private residence Living Arrangements: Alone   Type of Home: House Home Access: Ramped entrance     Home Layout: One level Home Equipment: Walker - 2 wheels      Prior Function Level of Independence: Independent with assistive device(s)               Hand Dominance  Extremity/Trunk Assessment   Upper Extremity Assessment: Overall WFL for tasks assessed           Lower Extremity Assessment: Generalized weakness (grossly 4/5; however fatigues with endurance)         Communication   Communication: No difficulties  Cognition Arousal/Alertness: Awake/alert Behavior During Therapy: Impulsive Overall Cognitive Status: Within Functional Limits for tasks assessed                      General Comments      Exercises        Assessment/Plan    PT Assessment Patient needs  continued PT services  PT Diagnosis Difficulty walking;Generalized weakness   PT Problem List Decreased strength;Decreased activity tolerance;Decreased balance;Decreased mobility;Cardiopulmonary status limiting activity  PT Treatment Interventions Gait training;Functional mobility training;Therapeutic exercise   PT Goals (Current goals can be found in the Care Plan section) Acute Rehab PT Goals Patient Stated Goal: to return to [facility] PT Goal Formulation: With patient Time For Goal Achievement: 03/20/15 Potential to Achieve Goals: Good    Frequency Min 2X/week   Barriers to discharge Decreased caregiver support      Co-evaluation               End of Session Equipment Utilized During Treatment: Gait belt;Oxygen Activity Tolerance: Patient limited by fatigue Patient left: in chair;with chair alarm set Nurse Communication: Mobility status         Time: [id number] PT Time Calculation (min) (ACUTE ONLY): 18 min   Charges:   PT Evaluation $PT Eval Moderate Complexity: 1 Procedure     PT G Codes:        Toyna Erisman 2015-04-03, 2:06 PM  Greggory Stallion, PT, DPT [phone]

## 2015-03-06 NOTE — Progress Notes (Signed)
ANTIBIOTIC CONSULT NOTE - INITIAL  Pharmacy Consult for Levaquin  Indication: pneumonia  Allergies  Allergen Reactions  . Penicillins Rash  . Codeine Other (See Comments)    Unknown reaction.  . Ceftin [Cefuroxime Axetil] Other (See Comments)    Unknown reaction  . Erythromycin Other (See Comments)    Unknown reaction  . Ibuprofen Other (See Comments)    Unknown reaction  . Sulfa Antibiotics Other (See Comments)    Unknown reaction    Patient Measurements: Height: 5\' 5"  (165.1 cm) Weight: 148 lb 8 oz (67.359 kg) IBW/kg (Calculated) : 57 Adjusted Body Weight:   Vital Signs: Temp: 98 F (36.7 C) (01/22 1242) Temp Source: Oral (01/22 1242) BP: 132/76 mmHg (01/22 1242) Pulse Rate: 96 (01/22 1242) Intake/Output from previous day:   Intake/Output from this shift: Total I/O In: 120 [P.O.:120] Out: 0   Labs:  Recent Labs  03/04/2015 1300 03/06/15 0206  WBC 12.1* 11.9*  HGB 13.7 12.6  PLT 232 211  CREATININE 2.26* 1.70*   Estimated Creatinine Clearance: 25.3 mL/min (by C-G formula based on Cr of 1.7). No results for input(s): VANCOTROUGH, VANCOPEAK, VANCORANDOM, GENTTROUGH, GENTPEAK, GENTRANDOM, TOBRATROUGH, TOBRAPEAK, TOBRARND, AMIKACINPEAK, AMIKACINTROU, AMIKACIN in the last 72 hours.   Microbiology: Recent Results (from the past 720 hour(s))  Culture, blood (Routine X 2) w Reflex to ID Panel     Status: None (Preliminary result)   Collection Time: 03/04/2015  1:00 PM  Result Value Ref Range Status   Specimen Description BLOOD LEFT ANTECUBITAL  Final   Special Requests BOTTLES DRAWN AEROBIC AND ANAEROBIC  1CC  Final   Culture NO GROWTH < 24 HOURS  Final   Report Status PENDING  Incomplete  Culture, blood (Routine X 2) w Reflex to ID Panel     Status: None (Preliminary result)   Collection Time: 03/09/2015  1:04 PM  Result Value Ref Range Status   Specimen Description BLOOD LEFT HAND  Final   Special Requests BOTTLES DRAWN AEROBIC AND ANAEROBIC  1CC  Final   Culture NO GROWTH < 24 HOURS  Final   Report Status PENDING  Incomplete    Medical History: Past Medical History  Diagnosis Date  . Cancer [facility])     right breast cancer  . S/P radiation therapy     right breast cancer    Medications:  Prescriptions prior to admission  Medication Sig Dispense Refill Last Dose  . acetaminophen (TYLENOL ARTHRITIS PAIN) 650 MG CR tablet Take 1,300 mg by mouth 2 (two) times daily.   unknown  . albuterol (PROVENTIL HFA;VENTOLIN HFA) 108 (90 Base) MCG/ACT inhaler Inhale 2 puffs into the lungs See admin instructions. Inhale 2 puffs every 4 to 6 hours as needed for shortness of breath.   prn  . albuterol (PROVENTIL) (2.5 MG/3ML) 0.083% nebulizer solution Take 2.5 mg by nebulization 4 (four) times daily as needed for wheezing or shortness of breath (cough).   prn  . aspirin EC 81 MG tablet Take 81 mg by mouth at bedtime.   unknown  . atenolol (TENORMIN) 50 MG tablet Take 50 mg by mouth at bedtime.   unknown  . atorvastatin (LIPITOR) 20 MG tablet Take 20 mg by mouth at bedtime.   unknown  . azithromycin (ZITHROMAX) 250 MG tablet Take 250-500 mg by mouth See admin instructions. Take 2 tablets (500mg ) by mouth now, then 1 tablet by mouth every day x 4 days.   unknown  . calcium carbonate (TUMS) 500 MG chewable tablet Chew 1,000  mg by mouth every 2 (two) hours as needed for indigestion or heartburn. *Maximum 10 tablets a day*   unknown  . Carboxymethylcellul-Glycerin 0.5-0.9 % SOLN Apply 1-2 drops to eye 3 (three) times daily as needed (for dry eye.).   prn  . cetirizine (ZYRTEC) 10 MG tablet Take 10 mg by mouth at bedtime.   unknown  . Cholecalciferol 1000 units capsule Take 1,000 Units by mouth daily.   unknown  . diclofenac sodium (VOLTAREN) 1 % GEL Apply 2-4 g topically See admin instructions. Apply 2 grams to affected area above waist, and 4 grams to affected area below the waist up to 4 times as needed for pain.   unknown  . docusate sodium (COLACE) 100 MG  capsule Take 100 mg by mouth daily.   unknown  . DULoxetine (CYMBALTA) 60 MG capsule Take 60 mg by mouth daily.   unknown  . fluticasone (FLOVENT DISKUS) 50 MCG/BLIST diskus inhaler Inhale 2 puffs into the lungs 2 (two) times daily.   unknown  . levothyroxine (SYNTHROID, LEVOTHROID) 175 MCG tablet Take 175 mcg by mouth at bedtime.   unknown  . losartan-hydrochlorothiazide (HYZAAR) 100-25 MG tablet Take 1 tablet by mouth daily.   unknown  . phenytoin (DILANTIN) 100 MG ER capsule Take 300 mg by mouth at bedtime.   unknown  . [EXPIRED] predniSONE (DELTASONE) 20 MG tablet Take 40 mg by mouth daily. X 5 days.   unknown  . ranitidine (ZANTAC) 150 MG capsule Take 150 mg by mouth 2 (two) times daily.   unknown  . senna (SENOKOT) 8.6 MG tablet Take 2 tablets by mouth at bedtime.   unknown  . sodium chloride (NASAL MOIST) 0.65 % nasal spray Place 2 sprays into the nose every 2 (two) hours as needed for congestion.   prn  . traZODone (DESYREL) 50 MG tablet Take 50 mg by mouth at bedtime.   unknown  . Verapamil HCl CR 300 MG CP24 Take 300 mg by mouth daily.   unknown   Assessment: CrCl = 25.3 ml/min   Goal of Therapy:  resolution of infection   Plan:  Expected duration 7 days with resolution of temperature and/or normalization of WBC   Levaquin 500 mg IV Q24H originally ordered.  Levaquin 500 mg IV X 1 to be given on 1/22 followed by Levaquin 250 mg IV Q24H to start 1/23 @ 18:00.   Brightyn Mozer D 03/06/2015,2:06 PM

## 2015-03-07 ENCOUNTER — Inpatient Hospital Stay: Payer: Medicare (Managed Care)

## 2015-03-07 ENCOUNTER — Inpatient Hospital Stay
Admit: 2015-03-07 | Discharge: 2015-03-07 | Disposition: A | Discharge status: Home / Self Care | Payer: Medicare (Managed Care) | Attending: Cardiovascular Disease | Admitting: Cardiovascular Disease

## 2015-03-07 DIAGNOSIS — J189 Pneumonia, unspecified organism: Secondary | ICD-10-CM

## 2015-03-07 DIAGNOSIS — J441 Chronic obstructive pulmonary disease with (acute) exacerbation: Secondary | ICD-10-CM

## 2015-03-07 LAB — PHENYTOIN LEVEL, TOTAL: Phenytoin Lvl: 11.9 ug/mL (ref 10.0–20.0)

## 2015-03-07 LAB — BASIC METABOLIC PANEL
Anion gap: 7 (ref 5–15)
BUN: 47 mg/dL — AB (ref 6–20)
CHLORIDE: 100 mmol/L — AB (ref 101–111)
CO2: 34 mmol/L — ABNORMAL HIGH (ref 22–32)
Calcium: 8.1 mg/dL — ABNORMAL LOW (ref 8.9–10.3)
Creatinine, Ser: 0.85 mg/dL (ref 0.44–1.00)
GFR calc non Af Amer: >60 mL/min (ref >60)
Glucose, Bld: 129 mg/dL — ABNORMAL HIGH (ref 65–99)
POTASSIUM: 4.2 mmol/L (ref 3.5–5.1)
SODIUM: 141 mmol/L (ref 135–145)

## 2015-03-07 LAB — BLOOD GAS, ARTERIAL
ALLENS TEST (PASS/FAIL): POSITIVE — AB
Acid-Base Excess: 9.8 mmol/L — ABNORMAL HIGH (ref 0.0–3.0)
BICARBONATE: 39.4 meq/L — AB (ref 21.0–28.0)
FIO2: 40
O2 SAT: 91 %
PATIENT TEMPERATURE: 37
PO2 ART: 68 mmHg — AB (ref 83.0–108.0)
pCO2 arterial: 82 mmHg (ref 32.0–48.0)
pH, Arterial: 7.29 — ABNORMAL LOW (ref 7.350–7.450)

## 2015-03-07 LAB — MRSA PCR SCREENING: MRSA by PCR: NEGATIVE

## 2015-03-07 LAB — CBC
HEMATOCRIT: 37.8 % (ref 35.0–47.0)
Hemoglobin: 12.3 g/dL (ref 12.0–16.0)
MCH: 30.9 pg (ref 26.0–34.0)
MCHC: 32.6 g/dL (ref 32.0–36.0)
MCV: 94.9 fL (ref 80.0–100.0)
Platelets: 145 10*3/uL — ABNORMAL LOW (ref 150–440)
RBC: 3.99 MIL/uL (ref 3.80–5.20)
RDW: 15.4 % — ABNORMAL HIGH (ref 11.5–14.5)
WBC: 8.3 10*3/uL (ref 3.6–11.0)

## 2015-03-07 LAB — GLUCOSE, CAPILLARY: Glucose-Capillary: 132 mg/dL — ABNORMAL HIGH (ref 65–99)

## 2015-03-07 MED ORDER — VANCOMYCIN HCL IN DEXTROSE 1-5 GM/200ML-% IV SOLN
1000.0000 mg | INTRAVENOUS | Status: DC
  Administered 2015-03-08: 1000 mg via INTRAVENOUS
  Filled 2015-03-07 (×3): qty 200

## 2015-03-07 MED ORDER — LEVOFLOXACIN IN D5W 500 MG/100ML IV SOLN
500.0000 mg | INTRAVENOUS | Status: DC
  Administered 2015-03-08 (×2): 500 mg via INTRAVENOUS
  Filled 2015-03-07 (×4): qty 100

## 2015-03-07 MED ORDER — CHLORHEXIDINE GLUCONATE 0.12 % MT SOLN
15.0000 mL | Freq: Two times a day (BID) | OROMUCOSAL | Status: DC
  Administered 2015-03-07 – 2015-03-14 (×12): 15 mL via OROMUCOSAL
  Filled 2015-03-07 (×11): qty 15

## 2015-03-07 MED ORDER — CLOPIDOGREL BISULFATE 75 MG PO TABS
75.0000 mg | ORAL_TABLET | Freq: Every day | ORAL | Status: DC
  Administered 2015-03-07 – 2015-03-21 (×10): 75 mg via ORAL
  Filled 2015-03-07 (×10): qty 1

## 2015-03-07 MED ORDER — CETYLPYRIDINIUM CHLORIDE 0.05 % MT LIQD
7.0000 mL | Freq: Two times a day (BID) | OROMUCOSAL | Status: DC
  Administered 2015-03-07: 7 mL via OROMUCOSAL

## 2015-03-07 MED ORDER — SODIUM CHLORIDE 0.9 % IV SOLN
INTRAVENOUS | Status: DC
  Administered 2015-03-07: 11:00:00 via INTRAVENOUS

## 2015-03-07 MED ORDER — IPRATROPIUM-ALBUTEROL 0.5-2.5 (3) MG/3ML IN SOLN
3.0000 mL | RESPIRATORY_TRACT | Status: DC
  Administered 2015-03-07 – 2015-03-08 (×8): 3 mL via RESPIRATORY_TRACT
  Filled 2015-03-07 (×8): qty 3

## 2015-03-07 MED ORDER — METHYLPREDNISOLONE SODIUM SUCC 40 MG IJ SOLR
20.0000 mg | Freq: Two times a day (BID) | INTRAMUSCULAR | Status: DC
  Administered 2015-03-07 – 2015-03-16 (×17): 20 mg via INTRAVENOUS
  Filled 2015-03-07 (×17): qty 1

## 2015-03-07 MED ORDER — BUDESONIDE 0.5 MG/2ML IN SUSP
0.5000 mg | Freq: Two times a day (BID) | RESPIRATORY_TRACT | Status: DC
  Administered 2015-03-07 – 2015-03-10 (×6): 0.5 mg via RESPIRATORY_TRACT
  Filled 2015-03-07 (×6): qty 2

## 2015-03-07 MED ORDER — CETYLPYRIDINIUM CHLORIDE 0.05 % MT LIQD
7.0000 mL | Freq: Two times a day (BID) | OROMUCOSAL | Status: DC
  Administered 2015-03-08 – 2015-03-19 (×14): 7 mL via OROMUCOSAL

## 2015-03-07 MED ORDER — ISOSORBIDE MONONITRATE ER 30 MG PO TB24
30.0000 mg | ORAL_TABLET | Freq: Every day | ORAL | Status: DC
  Administered 2015-03-07 – 2015-03-13 (×6): 30 mg via ORAL
  Filled 2015-03-07 (×6): qty 1

## 2015-03-07 NOTE — Progress Notes (Signed)
SUBJECTIVE: Patient is feeling much better no chest pain   Filed Vitals:   03/06/15 2109 03/06/15 2110 03/07/15 0407 03/07/15 0826  BP:   132/59   Pulse: 76  52   Temp:   97.5 F (36.4 C)   TempSrc:   Oral   Resp:   18   Height:      Weight:   148 lb 12.8 oz (67.495 kg)   SpO2:  94% 100% 91%    Intake/Output Summary (Last 24 hours) at 03/07/15 0829 Last data filed at 03/07/15 0715  Gross per 24 hour  Intake   3660 ml  Output    953 ml  Net   2707 ml    LABS: Basic Metabolic Panel:  Recent Labs  03/06/15 0206 03/06/15 2006  NA 140 135  K 3.2* 3.4*  CL 98* 97*  CO2 31 32  GLUCOSE 124* 132*  BUN 55* 50*  CREATININE 1.70* 1.14*  CALCIUM 7.8* 7.4*   Liver Function Tests:  Recent Labs  03/06/15 0206 03/06/15 2006  AST 932* 728*  ALT 1148* 1105*  ALKPHOS 114 118  BILITOT 1.1 0.6  PROT 5.9* 6.0*  ALBUMIN 3.4* 3.3*   No results for input(s): LIPASE, AMYLASE in the last 72 hours. CBC:  Recent Labs  02/15/2015 1300 03/06/15 0206  WBC 12.1* 11.9*  NEUTROABS 10.4*  --   HGB 13.7 12.6  HCT 41.6 38.7  MCV 93.9 96.7  PLT 232 211   Cardiac Enzymes:  Recent Labs  03/13/2015 2030 03/06/15 0206 03/06/15 0743 03/06/15 2006  CKTOTAL  --   --   --  189  TROPONINI 3.97* 2.95* 2.46*  --    BNP: Invalid input(s): POCBNP D-Dimer: No results for input(s): DDIMER in the last 72 hours. Hemoglobin A1C: No results for input(s): HGBA1C in the last 72 hours. Fasting Lipid Panel: No results for input(s): CHOL, HDL, LDLCALC, TRIG, CHOLHDL, LDLDIRECT in the last 72 hours. Thyroid Function Tests: No results for input(s): TSH, T4TOTAL, T3FREE, THYROIDAB in the last 72 hours.  Invalid input(s): FREET3 Anemia Panel: No results for input(s): VITAMINB12, FOLATE, FERRITIN, TIBC, IRON, RETICCTPCT in the last 72 hours.   PHYSICAL EXAM General: Well developed, well nourished, in no acute distress HEENT:  Normocephalic and atramatic Neck:  No JVD.  Lungs: Clear  bilaterally to auscultation and percussion. Heart: HRRR . Normal S1 and S2 without gallops or murmurs.  Abdomen: Bowel sounds are positive, abdomen soft and non-tender  Msk:  Back normal, normal gait. Normal strength and tone for age. Extremities: No clubbing, cyanosis or edema.   Neuro: Alert and oriented X 3. Psych:  Good affect, responds appropriately  TELEMETRY: Sinus rhythm  ASSESSMENT AND PLAN: Non-STEMI, patient has elevated troponin but also has renal failure and dementia. Advise conservative treatment with aspirin and Plavix and nitrates.  Principal Problem:   NSTEMI (non-ST elevated myocardial infarction) ([place]) Active Problems:   HCAP (healthcare-associated pneumonia)   Abnormal LFTs   ARF (acute renal failure) ([facility])    Durenda Pechacek A, MD, [facility] 03/07/2015 8:29 AM

## 2015-03-07 NOTE — Progress Notes (Signed)
PT Cancellation Note  Patient Details Name: Julie Anthony MRN: [id number] DOB: 1940-02-26   Cancelled Treatment:    Reason Eval/Treat Not Completed: Medical issues which prohibited therapy.  Pt appearing lethargic in bed.  O2 noted to be 82% on supplemental O2 via nasal cannula.  Respiratory arrived at that moment and notified of O2 saturations and placed pt on breathing treatment.  Unable to initiate functional mobility d/t O2 impairment.  Nursing notified of all of above and plans to check on pt.  Will re-attempt PT treatment session at a later date/time.   Leitha Bleak 03/07/2015, 4:21 PM Leitha Bleak, [place]

## 2015-03-07 NOTE — Progress Notes (Signed)
Echocardiogram 2D Echocardiogram has been performed.  Julie Anthony 03/07/2015, 3:33 PM

## 2015-03-07 NOTE — Progress Notes (Signed)
Per Erline Levine, RT, CO2 is elevated, she is calling Dr. Ether Griffins.

## 2015-03-07 NOTE — Progress Notes (Signed)
Dr. Ether Griffins notified that patient seems more lethargic than earlier today, but easy to awaken. Intermittent confusion throughout the day, slightly more than normal per a visitor. RT had to give patient breathing treatment and increase O2 - asked MD for ABG. Orders placed, will continue to monitor.

## 2015-03-07 NOTE — Progress Notes (Signed)
Per pt it is ok to discuss plan of care with niece, Alen Blew [phone]

## 2015-03-07 NOTE — Evaluation (Signed)
Clinical/Bedside Swallow Evaluation Patient Details  Name: Julie Anthony MRN: [id number] Date of Birth: September 11, 1940  Today's Date: 03/07/2015 Time: SLP Start Time (ACUTE ONLY): 0930 SLP Stop Time (ACUTE ONLY): 1030 SLP Time Calculation (min) (ACUTE ONLY): 60 min  Past Medical History:  Past Medical History  Diagnosis Date  . Cancer [facility])     right breast cancer  . S/P radiation therapy     right breast cancer   Past Surgical History:  Past Surgical History  Procedure Laterality Date  . Abdominal hysterectomy    . Breast excisional biopsy Right     positive 2006  . Breast biopsy Left     negative 02/2012 stereotactic   HPI:  Pt is a 75 y.o. female has a past medical history significant for CAD, Dementia, COPD/asthma, OA and breast cancer w/ radiation who presents to ER with persistent cough, congestion, and SOB. Was being treated by her PCP for pneumonia with Septra DS. In ER, pt found to have persistent LLL pneumonia with ARF and abnormal LFT's. Also noted to have troponin=5.5 c/w NSTEMI. She denies CP. Pt is currently on a dys. 3 w/ thin liquids. Pt denies any coughing, choking, or strangling when eating/drinking at meals. Presently, pt has been swallowing pills w/ liquids w/ NSG who deny any overt s/s of aspiration. She can feed herself at meals w/ setup and min. assist. Noted CXR and labs.    Assessment / Plan / Recommendation Clinical Impression  Pt appears to adequately tolerate trials of thin liquids and purees/soft solids w/ no overt s/s of aspiration noted; oral phase management was grossly wfl for bolus control for transfer and clearing post swallow. Pt is edentulous baseline and took her time to gum and mash the soft solid trials. Suspect this is baseline for pt; she agreed. Pt was able to feed self w/ setup and intermittent assist w/ utensil. Suspect this is d/t her decline Cognitive status/Dementi(baseline). Pt appears at reduced risk for aspiration w/ a modified diet  consistency and general aspiration precautions; assistance at meals as nec. NSG to monitor toleration of meds w/ liquids carefully.     Aspiration Risk   (reduced w/ precautions)    Diet Recommendation  Dys. 2 w/ thin liquids; aspiration precautions; tray setup at meals and feeding assist as nec.  Medication Administration: Whole meds with liquid (or in puree if nec.)    Other  Recommendations Recommended Consults:  (Dietician) Oral Care Recommendations: Oral care BID;Staff/trained caregiver to provide oral care   Follow up Recommendations   (TBD)    Frequency and Duration min 2x/week  2 weeks       Prognosis Prognosis for Safe Diet Advancement: Good Barriers to Reach Goals: Cognitive deficits      Swallow Study   General Date of Onset: 02/24/2015 HPI: Pt is a 75 y.o. female has a past medical history significant for CAD, Dementia, COPD/asthma, OA and breast cancer w/ radiation who presents to ER with persistent cough, congestion, and SOB. Was being treated by her PCP for pneumonia with Septra DS. In ER, pt found to have persistent LLL pneumonia with ARF and abnormal LFT's. Also noted to have troponin=5.5 c/w NSTEMI. She denies CP. Pt is currently on a dys. 3 w/ thin liquids. Pt denies any coughing, choking, or strangling when eating/drinking at meals. Presently, pt has been swallowing pills w/ liquids w/ NSG who deny any overt s/s of aspiration. She can feed herself at meals w/ setup and min. assist. Noted CXR  and labs.  Type of Study: Bedside Swallow Evaluation Previous Swallow Assessment: none indicated Diet Prior to this Study: Dysphagia 3 (soft);Thin liquids (on currently per MD) Temperature Spikes Noted: No (wbc 8.3) Respiratory Status: Nasal cannula (3 liters) History of Recent Intubation: No Behavior/Cognition: Alert;Cooperative;Pleasant mood;Requires cueing;Distractible Oral Cavity Assessment: Within Functional Limits (grossly) Oral Care Completed by SLP: No (already eating  her breakfast meal) Oral Cavity - Dentition: Edentulous Vision: Functional for self-feeding (grossly) Self-Feeding Abilities: Needs assist;Needs set up;Able to feed self Patient Positioning: Upright in bed Baseline Vocal Quality: Normal (mumbled speech; reduced articulation at times) Volitional Cough: Strong;Congested Volitional Swallow: Able to elicit    Oral/Motor/Sensory Function Overall Oral Motor/Sensory Function: Within functional limits   Ice Chips Ice chips: Not tested   Thin Liquid Thin Liquid: Within functional limits Presentation: Self Fed;Cup;Straw (~4 ozs total)    Nectar Thick Nectar Thick Liquid: Not tested   Honey Thick Honey Thick Liquid: Not tested   Puree Puree: Within functional limits Presentation: Self Fed;Spoon (5-6 trials)   Solid   GO   Solid: Within functional limits (soft, broken down solid) Presentation: Spoon;Self Fed (w/ assist intermittently; 8 trials)       Orinda Kenner, MS, CCC-SLP  Watson,Katherine 03/07/2015,11:01 AM

## 2015-03-07 NOTE — Progress Notes (Addendum)
[place] [facility] [place] NAME: Julie Anthony    MR#:  [id number]  DATE OF BIRTH:  [birth year]  SUBJECTIVE:  CHIEF COMPLAINT:   Chief Complaint  Patient presents with  . Dysphagia  . Cough   the patient is a 75 year old Caucasian female with history of dementia, coronary artery disease, COPD, asthma, chronic respiratory failure on 2 L of oxygen through nasal cannula at home, who lives alone presents to the hospital with complaints of cough, chest congestion as well as shortness of breath. In emergency room, she was noted to have elevated troponin to 5.5, but had no chest pain. Patient's chest x-ray was concerning for left basilar infiltrate, repeat a chest x-ray today revealed worsening pneumonia versus atelectasis , concerning for mucous plugging. Discussed with Dr. Jamal Collin, pulmonologist, who recommended further valve. Patient remains on 2 L of oxygen through nasal cannula. Cardiologist does not recommend any interventions, but conservative therapy.   CT of chest, today 23rd of January revealed left upper and left lower lobe pulmonary opacity suspicious for infection or aspiration, pulmonary artery enlargement suggesting pulmonary hypertension. Pulmonary consultation was requested. Patient refuses to use flutter valve despite encouragement.   Review of Systems  Constitutional: Negative for fever, chills and weight loss.  HENT: Negative for congestion.   Eyes: Negative for blurred vision and double vision.  Respiratory: Positive for cough. Negative for hemoptysis, sputum production, shortness of breath and wheezing.   Cardiovascular: Negative for chest pain, palpitations, orthopnea, leg swelling and PND.  Gastrointestinal: Negative for nausea, vomiting, abdominal pain, diarrhea, constipation and blood in stool.  Genitourinary: Negative for dysuria, urgency, frequency and hematuria.  Musculoskeletal: Negative for falls.  Neurological: Negative for  dizziness, tremors, focal weakness and headaches.  Endo/Heme/Allergies: Does not bruise/bleed easily.  Psychiatric/Behavioral: Negative for depression. The patient does not have insomnia.     VITAL SIGNS: Blood pressure 110/71, pulse 108, temperature 97.4 F (36.3 C), temperature source Oral, resp. rate 17, height 5\' 5"  (1.651 m), weight 67.495 kg (148 lb 12.8 oz), SpO2 85 %.  PHYSICAL EXAMINATION:   GENERAL:  75 y.o.-year-old patient lying in the bed with no acute distress.  EYES: Pupils equal, round, reactive to light and accommodation. No scleral icterus. Extraocular muscles intact.  HEENT: Head atraumatic, normocephalic. Oropharynx and nasopharynx clear.  NECK:  Supple, no jugular venous distention. No thyroid enlargement, no tenderness.  LUNGS:Diminished breath sounds on the right, Good air entrance on the left , rales,rhonchi  and wheezing were noted bilaterally . No use of accessory muscles of respiration. Kyphosis  CARDIOVASCULAR: S1, S2 , bradycardic, regular. No murmurs, rubs, or gallops.  ABDOMEN: Soft, nontender, nondistended. Bowel sounds present. No organomegaly or mass.  EXTREMITIES: No pedal edema, cyanosis, or clubbing.  NEUROLOGIC: Cranial nerves II through XII are intact. Muscle strength 5/5 in all extremities. Sensation intact. Gait not checked.  PSYCHIATRIC: The patient is alert and oriented x 3.  SKIN: No obvious rash, lesion, or ulcer.   ORDERS/RESULTS REVIEWED:   CBC  Recent Labs Lab 02/20/2015 1300 03/06/15 0206 03/07/15 0904  WBC 12.1* 11.9* 8.3  HGB 13.7 12.6 12.3  HCT 41.6 38.7 37.8  PLT 232 211 145*  MCV 93.9 96.7 94.9  MCH 31.0 31.6 30.9  MCHC 33.0 32.6 32.6  RDW 14.7* 15.0* 15.4*  LYMPHSABS 0.8*  --   --   MONOABS 0.9  --   --   EOSABS 0.0  --   --   BASOSABS 0.0  --   --    ------------------------------------------------------------------------------------------------------------------  Chemistries   Recent Labs Lab 03/07/2015 1300  03/06/15 0206 03/06/15 2006 03/07/15 0904  NA 141 140 135 141  K 3.1* 3.2* 3.4* 4.2  CL 91* 98* 97* 100*  CO2 40* 31 32 34*  GLUCOSE 120* 124* 132* 129*  BUN 54* 55* 50* 47*  CREATININE 2.26* 1.70* 1.14* 0.85  CALCIUM 8.6* 7.8* 7.4* 8.1*  AST 1143* 932* 728*  --   ALT 1249* 1148* 1105*  --   ALKPHOS 133* 114 118  --   BILITOT 0.9 1.1 0.6  --    ------------------------------------------------------------------------------------------------------------------ estimated creatinine clearance is 50.7 mL/min (by C-G formula based on Cr of 0.85). ------------------------------------------------------------------------------------------------------------------ No results for input(s): TSH, T4TOTAL, T3FREE, THYROIDAB in the last 72 hours.  Invalid input(s): FREET3  Cardiac Enzymes  Recent Labs Lab 03/04/2015 2030 03/06/15 0206 03/06/15 0743  TROPONINI 3.97* 2.95* 2.46*   ------------------------------------------------------------------------------------------------------------------ Invalid input(s): POCBNP ---------------------------------------------------------------------------------------------------------------  RADIOLOGY: Ct Chest Wo Contrast  03/07/2015  CLINICAL DATA:  Pneumonia. Dementia. COPD/ asthma. Breast cancer. Cough. Congestion and shortness of breath for several days. EXAM: CT CHEST WITHOUT CONTRAST TECHNIQUE: Multidetector CT imaging of the chest was performed following the standard protocol without IV contrast. COMPARISON:  Plain films, including 03/06/2015.  No prior CT. FINDINGS: Mediastinum/Nodes: No supraclavicular adenopathy. Tortuous, atherosclerotic thoracic aorta. Moderate cardiomegaly with coronary artery atherosclerosis. Lipomatous hypertrophy of the interatrial septum. Pulmonary artery enlargement, outflow tract 3.9 cm. No gross mediastinal or hilar adenopathy. Lungs/Pleura: Small bilateral pleural effusions. Mild to moderate motion degradation throughout.  Suspect mucous plugging or aspiration within left sided endobronchial tree, including on image 24/series 3. Right base atelectasis. Posterior left upper lobe and left lower lobe airspace disease. Upper abdomen: Motion degradation continuing into the upper abdomen. Grossly normal liver, spleen, left kidney. Suspect a posterior gastric diverticulum. Abdominal aortic and branch vessel atherosclerosis. Musculoskeletal: accentuation of expected thoracic kyphosis. Mild T11 compression deformity. IMPRESSION: 1. Mild to moderately motion degraded exam. 2. Left upper and left lower lobe pulmonary opacities, suspicious for infection or aspiration. Fluid in the left endobronchial tree could relate to mucous plugging or aspiration. 3. Cardiomegaly. Atherosclerosis, including within the coronary arteries. 4. Small bilateral pleural effusions. 5. Pulmonary artery enlargement suggests pulmonary arterial hypertension. Electronically Signed   By: Abigail Miyamoto M.D.   On: 03/07/2015 14:34   Dg Chest Port 1 View  03/06/2015  CLINICAL DATA:  75 year old female with cough congestion and shortness of breath. Recently treated for pneumonia. Initial encounter. EXAM: PORTABLE CHEST 1 VIEW COMPARISON:  03/12/2015 FINDINGS: Portable AP upright view at 0843 hours. Worsening volume loss and opacification of the left lung, worst at the left lung base. Superimposed cardiomegaly, stable mediastinal contours. Lower lung volumes on the right without confluent opacity. No superimposed pneumothorax or large effusion. No acute pulmonary edema. Postoperative changes to the right axilla re- demonstrated. IMPRESSION: Worsening volume loss and opacification of the left lung, consider Mucous Plug and/or increased pneumonia. Electronically Signed   By: Genevie Ann M.D.   On: 03/06/2015 10:39    EKG:  Orders placed or performed during the hospital encounter of 02/13/2015  . ED EKG  . ED EKG    ASSESSMENT AND PLAN:  Principal Problem:   NSTEMI (non-ST  elevated myocardial infarction) ([place]) Active Problems:   HCAP (healthcare-associated pneumonia)   Abnormal LFTs   ARF (acute renal failure) ([place]) 1. Acute on chronic respiratory failure with hypoxia due to left lower lobe pneumonia, concerning for aspiration pneumonia versus mucus plugging, refuses to use flutter valve, appreciate  pulmonologist input, continue oxygen therapy as needed nebulizers as well as antibiotics 2. Left lower lobe pneumonia, continue aztreonam as well as vancomycin, levofloxacin, sputum cultures are pending.  Continue Humibid. Pulmonologist recommends IV steroids and Pulmicort 3. Acute renal failure, resolved with IV fluid administration, unremarkable urinalysis 4. Leukocytosis, resolved 5. Non-Q-wave MI, type 2, due to demand ischemia, per cardiologist, continue aspirin, heparin , nitrates, atenolol  A patient has worsening oxygen saturations/ requirements now on 5 L of oxygen through nasal cannula and having difficulty breathing. ABGs revealed elevated PCO2 level above 80 and pH of 7.29. Patient is going to be transferred to intensive care unit for BiPAP initiation by respiratory therapist Management plans discussed with the patient, family and they are in agreement.   DRUG ALLERGIES:  Allergies  Allergen Reactions  . Penicillins Rash  . Codeine Other (See Comments)    Unknown reaction.  . Ceftin [Cefuroxime Axetil] Other (See Comments)    Unknown reaction  . Erythromycin Other (See Comments)    Unknown reaction  . Ibuprofen Other (See Comments)    Unknown reaction  . Sulfa Antibiotics Other (See Comments)    Unknown reaction    CODE STATUS:     Code Status Orders        Start     Ordered   02/14/2015 1930  Full code   Continuous     03/04/2015 1929    Code Status History    Date Active Date Inactive Code Status Order ID Comments User Context   This patient has a current code status but no historical code status.      TOTAL  CRITICAL CARE TIME  TAKING CARE OF THIS PATIENT: 45 minutes.   Discussed with pulmonologist dr Roseanne Reno M.D on 03/07/2015 at 4:21 PM  Between 7am to 6pm - Pager - [phone]  After 6pm go to [email or web address] - password [email or web address] [facility]  Office  [phone]  CC: Primary care physician; [place]

## 2015-03-07 NOTE — Clinical Documentation Improvement (Signed)
Internal Medicine  Conflicting documentation noted in chart regarding NSTEMI and Demand Ischemia. Please clarify which diagnosis ruled in and document findings in next progress note; NOT in BPA drop down box.   NSTEMI ruled in  Demand Ischemia ruled in; NSTEMI ruled out  Other  Clinically Undetermined  Supporting Information:  "Elevated troponin most likely due to demand ischemia with no chest pain and nonspecific ST-T changes on EKG" documented by Cardiology  "Non-Q-wave MI, likely due to demand ischemia" documented by Medicine  Please exercise your independent, professional judgment when responding. A specific answer is not anticipated or expected.  Thank You, Zoila Shutter RN, BSN, [place] [phone]; Cell: [phone]

## 2015-03-07 NOTE — Progress Notes (Signed)
Patient was restless for most of the night, constantly requesting to use the [facility] with 25-121mL each time. Can be impulsive, bed alarm on middle setting. No complaints or signs/symptoms of distress noted. Nursing staff will continue to monitor. Earleen Reaper, RN

## 2015-03-07 NOTE — Progress Notes (Signed)
ANTIBIOTIC CONSULT NOTE - FOLLOW UP   Pharmacy Consult for Aztreonam and Vancomycin (Day 3); Levofloxacin Day 2  Indication: pneumonia  Allergies  Allergen Reactions  . Penicillins Rash  . Codeine Other (See Comments)    Unknown reaction.  . Ceftin [Cefuroxime Axetil] Other (See Comments)    Unknown reaction  . Erythromycin Other (See Comments)    Unknown reaction  . Ibuprofen Other (See Comments)    Unknown reaction  . Sulfa Antibiotics Other (See Comments)    Unknown reaction    Patient Measurements: Height: 5\' 5"  (165.1 cm) Weight: 148 lb 12.8 oz (67.495 kg) IBW/kg (Calculated) : 57 Adjusted Body Weight: 61 kg   Vital Signs: Temp: 97.4 F (36.3 C) (01/23 1305) Temp Source: Oral (01/23 1305) BP: 110/71 mmHg (01/23 1305) Pulse Rate: 108 (01/23 1305) Intake/Output from previous day: 01/22 0701 - 01/23 0700 In: 3660 [P.O.:240; I.V.:3420] Out: 928 [Urine:928] Intake/Output from this shift: Total I/O In: 240 [P.O.:240] Out: 375 [Urine:375]  Labs:  Recent Labs  03/09/2015 1300 03/06/15 0206 03/06/15 2006 03/07/15 0904  WBC 12.1* 11.9*  --  8.3  HGB 13.7 12.6  --  12.3  PLT 232 211  --  145*  CREATININE 2.26* 1.70* 1.14* 0.85   Estimated Creatinine Clearance: 50.7 mL/min (by C-G formula based on Cr of 0.85). No results for input(s): VANCOTROUGH, VANCOPEAK, VANCORANDOM, GENTTROUGH, GENTPEAK, GENTRANDOM, TOBRATROUGH, TOBRAPEAK, TOBRARND, AMIKACINPEAK, AMIKACINTROU, AMIKACIN in the last 72 hours.   Microbiology: Recent Results (from the past 720 hour(s))  Culture, blood (Routine X 2) w Reflex to ID Panel     Status: None (Preliminary result)   Collection Time: 02/25/2015  1:00 PM  Result Value Ref Range Status   Specimen Description BLOOD LEFT ANTECUBITAL  Final   Special Requests BOTTLES DRAWN AEROBIC AND ANAEROBIC  1CC  Final   Culture NO GROWTH 2 DAYS  Final   Report Status PENDING  Incomplete  Culture, blood (Routine X 2) w Reflex to ID Panel     Status: None  (Preliminary result)   Collection Time: 02/14/2015  1:04 PM  Result Value Ref Range Status   Specimen Description BLOOD LEFT HAND  Final   Special Requests BOTTLES DRAWN AEROBIC AND ANAEROBIC  1CC  Final   Culture NO GROWTH 2 DAYS  Final   Report Status PENDING  Incomplete    Medical History: Past Medical History  Diagnosis Date  . Cancer [facility])     right breast cancer  . S/P radiation therapy     right breast cancer    Medications:  Prescriptions prior to admission  Medication Sig Dispense Refill Last Dose  . acetaminophen (TYLENOL ARTHRITIS PAIN) 650 MG CR tablet Take 1,300 mg by mouth 2 (two) times daily.   unknown  . albuterol (PROVENTIL HFA;VENTOLIN HFA) 108 (90 Base) MCG/ACT inhaler Inhale 2 puffs into the lungs See admin instructions. Inhale 2 puffs every 4 to 6 hours as needed for shortness of breath.   prn  . albuterol (PROVENTIL) (2.5 MG/3ML) 0.083% nebulizer solution Take 2.5 mg by nebulization 4 (four) times daily as needed for wheezing or shortness of breath (cough).   prn  . aspirin EC 81 MG tablet Take 81 mg by mouth at bedtime.   unknown  . atenolol (TENORMIN) 50 MG tablet Take 50 mg by mouth at bedtime.   unknown  . atorvastatin (LIPITOR) 20 MG tablet Take 20 mg by mouth at bedtime.   unknown  . azithromycin (ZITHROMAX) 250 MG tablet Take  250-500 mg by mouth See admin instructions. Take 2 tablets (500mg ) by mouth now, then 1 tablet by mouth every day x 4 days.   unknown  . calcium carbonate (TUMS) 500 MG chewable tablet Chew 1,000 mg by mouth every 2 (two) hours as needed for indigestion or heartburn. *Maximum 10 tablets a day*   unknown  . Carboxymethylcellul-Glycerin 0.5-0.9 % SOLN Apply 1-2 drops to eye 3 (three) times daily as needed (for dry eye.).   prn  . cetirizine (ZYRTEC) 10 MG tablet Take 10 mg by mouth at bedtime.   unknown  . Cholecalciferol 1000 units capsule Take 1,000 Units by mouth daily.   unknown  . diclofenac sodium (VOLTAREN) 1 % GEL Apply 2-4 g  topically See admin instructions. Apply 2 grams to affected area above waist, and 4 grams to affected area below the waist up to 4 times as needed for pain.   unknown  . docusate sodium (COLACE) 100 MG capsule Take 100 mg by mouth daily.   unknown  . DULoxetine (CYMBALTA) 60 MG capsule Take 60 mg by mouth daily.   unknown  . fluticasone (FLOVENT DISKUS) 50 MCG/BLIST diskus inhaler Inhale 2 puffs into the lungs 2 (two) times daily.   unknown  . levothyroxine (SYNTHROID, LEVOTHROID) 175 MCG tablet Take 175 mcg by mouth at bedtime.   unknown  . losartan-hydrochlorothiazide (HYZAAR) 100-25 MG tablet Take 1 tablet by mouth daily.   unknown  . phenytoin (DILANTIN) 100 MG ER capsule Take 300 mg by mouth at bedtime.   unknown  . [EXPIRED] predniSONE (DELTASONE) 20 MG tablet Take 40 mg by mouth daily. X 5 days.   unknown  . ranitidine (ZANTAC) 150 MG capsule Take 150 mg by mouth 2 (two) times daily.   unknown  . senna (SENOKOT) 8.6 MG tablet Take 2 tablets by mouth at bedtime.   unknown  . sodium chloride (NASAL MOIST) 0.65 % nasal spray Place 2 sprays into the nose every 2 (two) hours as needed for congestion.   prn  . traZODone (DESYREL) 50 MG tablet Take 50 mg by mouth at bedtime.   unknown  . Verapamil HCl CR 300 MG CP24 Take 300 mg by mouth daily.   unknown   Assessment: CrCl = 19.1 ml/min Ke = 0.02 hr-1 T1/2 = 34.7 hrs Vd = 46.9 L    1/23: Based on improvement in renal function,   PK parameters: Kel (hr-1): 0.046 Half-life (hrs): 15.07 Vd (liters): 47.25 (factor used: 0.7 L/kg)   Goal of Therapy:  Vancomycin trough level 15-20 mcg/ml  Plan:  Expected duration 7 days with resolution of temperature and/or normalization of WBC   Vancomycin: Will change to Vancomycin 1 g IV q18 hours starting @ 02:00 on 1/24. Trough level ordered to be drawn prior to the 14:00 dose on 1/25. Target trough level 15-20 mcg/ml.   Azactam: Continue Azactam 1 g IV q8 hours.   Levofloxacin: Will change to  Levofloxacin 500 mg IV q24 hours.   Numair Masden D 03/07/2015,3:51 PM

## 2015-03-07 NOTE — Progress Notes (Signed)
Per Dr. Humphrey Rolls, discontinue heparin drip

## 2015-03-07 NOTE — Plan of Care (Signed)
Problem: SLP Dysphagia Goals Goal: Misc Dysphagia Goal Pt will safely tolerate po diet of least restrictive consistency w/ no overt s/s of aspiration noted by Staff/pt/family x3 sessions.    

## 2015-03-07 NOTE — Care Management Note (Signed)
Case Management Note  Patient Details  Name: Julie Anthony MRN: [id number] Date of Birth: [birth year]  Subjective/Objective:  Hospital visit received from Van Meter, home care coordinator with [facility]. She states that although [facility] can not provide 24 hour care and night time care, they are able to provide her with a [facility] day program 5 days a week and increase her home care visits to help cover any needs that the patient may have. She emphasized they could not provide overnight care. Notified Raquel Sarna with PT so team care reevaluate and make adjustments as needed.                  Action/Plan: Following progression  Expected Discharge Date:                  Expected Discharge Plan:  [place]  In-House Referral:     Discharge planning Services  CM Consult  Post Acute Care Choice:   ([facility] ) Choice offered to:     DME Arranged:    DME Agency:     HH Arranged:    HH Agency:     Status of Service:  In process, will continue to follow  Medicare Important Message Given:    Date Medicare IM Given:    Medicare IM give by:    Date Additional Medicare IM Given:    Additional Medicare Important Message give by:     If discussed at [place] of Stay Meetings, dates discussed:    Additional Comments:  Jolly Mango, RN 03/07/2015, 1:36 PM

## 2015-03-07 NOTE — Progress Notes (Signed)
Per Erline Levine, RT pt will be going to bed 7-per ICU charge nurse. Waiting for bed approval, however RT wants patient on continuous bipap now and is transferring pt. Called charge nurse to give them a heads up and was told to come over with patient and give bedside report. Patient belongings gathered and taken to new room by NT.

## 2015-03-07 NOTE — Consult Note (Signed)
[facility] Consultation      Date: 03/07/2015,   MRN# [id number] Julie Anthony 26-Sep-1940 Code Status:     Code Status Orders        Start     Ordered   02/15/2015 1930  Full code   Continuous     03/06/2015 1929    Code Status History    Date Active Date Inactive Code Status Order ID Comments User Context   This patient has a current code status but no historical code status.     Hosp day:@LENGTHOFSTAYDAYS @ Referring MD: @ATDPROV @     PCP:      AdmissionWeight: 147 lb 9.6 oz (66.951 kg)                 CurrentWeight: 148 lb 12.8 oz (67.495 kg) Julie Anthony is a 75 y.o. old female seen in consultation for pneumonia at the request of Dr Ether Griffins     CHIEF COMPLAINT:   Cough, SOB   HISTORY OF PRESENT ILLNESS   75 y.o. female has a past medical history significant for CAD, dementia, COPD/asthma, OA and breast cancer who presents to ER with persistent cough, congestion, and SOB for past several days -Was being treated by her PCP for pneumonia with Septra DS. -she was found to have opacity on left side on CXR.  -Also noted to have troponin=5.5 c/w NSTEMI.  -patient laying in bed has b/l wheezing Patient states she wants go home. Her breathing seems to be ok at this time  Patient seems to have underlying  Dementia unable to provide complete details  I have ordered CT chest to assess lung fields Patient is a former smoking   PAST MEDICAL HISTORY   Past Medical History  Diagnosis Date  . Cancer [facility])     right breast cancer  . S/P radiation therapy     right breast cancer     SURGICAL HISTORY   Past Surgical History  Procedure Laterality Date  . Abdominal hysterectomy    . Breast excisional biopsy Right     positive 2006  . Breast biopsy Left     negative 02/2012 stereotactic     FAMILY HISTORY   Family History  Problem Relation Age of Onset  . Breast cancer Neg Hx      SOCIAL HISTORY   Social History  Substance Use  Topics  . Smoking status: Former Smoker    Quit date: 03/04/2010  . Smokeless tobacco: None  . Alcohol Use: None     MEDICATIONS    Home Medication:  No current outpatient prescriptions on file.  Current Medication:  Current facility-administered medications:  .  0.9 %  sodium chloride infusion, , Intravenous, Continuous, Lavonia Dana, MD, Last Rate: 50 mL/hr at 03/07/15 1058 .  antiseptic oral rinse (CPC / CETYLPYRIDINIUM CHLORIDE 0.05%) solution 7 mL, 7 mL, Mouth Rinse, BID, Sital Mody, MD, 7 mL at 03/06/15 1000 .  aspirin EC tablet 81 mg, 81 mg, Oral, Daily, Idelle Crouch, MD, 81 mg at 03/07/15 0913 .  atenolol (TENORMIN) tablet 50 mg, 50 mg, Oral, QHS, Idelle Crouch, MD, 50 mg at 03/06/15 2110 .  atorvastatin (LIPITOR) tablet 20 mg, 20 mg, Oral, QHS, Idelle Crouch, MD, 20 mg at 03/06/15 2110 .  aztreonam (AZACTAM) 1 g in dextrose 5 % 50 mL IVPB, 1 g, Intravenous, 3 times per day, Bettey Costa, MD, 1 g at 03/07/15 0544 .  bisacodyl (DULCOLAX) suppository 10 mg, 10  mg, Rectal, Daily PRN, Idelle Crouch, MD .  budesonide (PULMICORT) nebulizer solution 0.25 mg, 0.25 mg, Nebulization, BID, Bettey Costa, MD, 0.25 mg at 03/07/15 0826 .  calcium carbonate (TUMS - dosed in mg elemental calcium) chewable tablet 1,000 mg, 1,000 mg, Oral, Q2H PRN, Idelle Crouch, MD, 1,000 mg at 03/06/15 2143 .  cholecalciferol (VITAMIN D) tablet 1,000 Units, 1,000 Units, Oral, Daily, Idelle Crouch, MD, 1,000 Units at 03/07/15 0913 .  clopidogrel (PLAVIX) tablet 75 mg, 75 mg, Oral, Daily, Dionisio David, MD, 75 mg at 03/07/15 0913 .  docusate sodium (COLACE) capsule 100 mg, 100 mg, Oral, BID, Idelle Crouch, MD, 100 mg at 03/07/15 0913 .  DULoxetine (CYMBALTA) DR capsule 60 mg, 60 mg, Oral, Daily, Idelle Crouch, MD, 60 mg at 03/07/15 0913 .  famotidine (PEPCID) tablet 20 mg, 20 mg, Oral, BID, Idelle Crouch, MD, 20 mg at 03/07/15 0914 .  guaiFENesin (MUCINEX) 12 hr tablet 600 mg, 600 mg,  Oral, BID, Theodoro Grist, MD, 600 mg at 03/07/15 0913 .  ipratropium-albuterol (DUONEB) 0.5-2.5 (3) MG/3ML nebulizer solution 3 mL, 3 mL, Nebulization, QID, Idelle Crouch, MD, 3 mL at 03/07/15 1127 .  isosorbide mononitrate (IMDUR) 24 hr tablet 30 mg, 30 mg, Oral, Daily, Dionisio David, MD, 30 mg at 03/07/15 0913 .  Levofloxacin (LEVAQUIN) IVPB 250 mg, 250 mg, Intravenous, Q24H, Theodoro Grist, MD .  levothyroxine (SYNTHROID, LEVOTHROID) tablet 175 mcg, 175 mcg, Oral, QHS, Idelle Crouch, MD, 175 mcg at 03/06/15 2110 .  loratadine (CLARITIN) tablet 10 mg, 10 mg, Oral, Daily, Idelle Crouch, MD, 10 mg at 03/07/15 0913 .  losartan (COZAAR) tablet 100 mg, 100 mg, Oral, Daily, Idelle Crouch, MD, 100 mg at 03/07/15 0913 .  morphine 2 MG/ML injection 2 mg, 2 mg, Intravenous, Q2H PRN, Idelle Crouch, MD, 2 mg at 03/06/15 0012 .  ondansetron (ZOFRAN) tablet 4 mg, 4 mg, Oral, Q6H PRN, 4 mg at 03/06/15 1949 **OR** ondansetron (ZOFRAN) injection 4 mg, 4 mg, Intravenous, Q6H PRN, Idelle Crouch, MD .  phenytoin (DILANTIN) ER capsule 300 mg, 300 mg, Oral, QHS, Idelle Crouch, MD, 300 mg at 03/06/15 2109 .  senna (SENOKOT) tablet 17.2 mg, 2 tablet, Oral, QHS, Idelle Crouch, MD, 17.2 mg at 03/09/2015 2155 .  sodium chloride 0.9 % injection 3 mL, 3 mL, Intravenous, Q12H, Idelle Crouch, MD, 3 mL at 03/06/15 1000 .  traZODone (DESYREL) tablet 50 mg, 50 mg, Oral, QHS, Idelle Crouch, MD, 50 mg at 03/06/15 2109 .  vancomycin (VANCOCIN) IVPB 1000 mg/200 mL premix, 1,000 mg, Intravenous, Q48H, Bettey Costa, MD, 1,000 mg at 03/06/15 0829 .  verapamil (CALAN-SR) 300 mg, 300 mg, Oral, Daily, Theodoro Grist, MD, 300 mg at 03/07/15 0917    ALLERGIES   Penicillins; Codeine; Ceftin; Erythromycin; Ibuprofen; and Sulfa antibiotics     REVIEW OF SYSTEMS   Review of Systems  Constitutional: Negative for fever, chills, weight loss and malaise/fatigue.  Eyes: Negative for blurred vision.  Respiratory:  Negative for cough, hemoptysis, sputum production and shortness of breath.   Cardiovascular: Negative for chest pain, palpitations and leg swelling.  Gastrointestinal: Negative for nausea, vomiting and abdominal pain.  Neurological: Negative for focal weakness.  Psychiatric/Behavioral: The patient is not nervous/anxious.   All other systems reviewed and are negative.    VS: BP 110/71 mmHg  Pulse 108  Temp(Src) 97.4 F (36.3 C) (Oral)  Resp 17  Ht 5'  5" (1.651 m)  Wt 148 lb 12.8 oz (67.495 kg)  BMI 24.76 kg/m2  SpO2 97%     PHYSICAL EXAM  Physical Exam  Constitutional: No distress.  HENT:  Head: Normocephalic and atraumatic.  Mouth/Throat: No oropharyngeal exudate.  Eyes: Conjunctivae are normal. Pupils are equal, round, and reactive to light. No scleral icterus.  Neck: Normal range of motion. Neck supple.  Cardiovascular: Normal rate, regular rhythm and normal heart sounds.   No murmur heard. Pulmonary/Chest: Effort normal. She has wheezes. She has rales.  Abdominal: Soft. Bowel sounds are normal.  Musculoskeletal: Normal range of motion. She exhibits no edema.  Neurological: She is alert.  Skin: She is not diaphoretic.  Psychiatric: She has a normal mood and affect.  Poor insight        LABS    Recent Labs     02/20/2015  1300  03/06/15  0206  03/06/15  2006  03/07/15  0904  HGB  13.7  12.6   --   12.3  HCT  41.6  38.7   --   37.8  MCV  93.9  96.7   --   94.9  WBC  12.1*  11.9*   --   8.3  BUN  54*  55*  50*  47*  CREATININE  2.26*  1.70*  1.14*  0.85  GLUCOSE  120*  124*  132*  129*  CALCIUM  8.6*  7.8*  7.4*  8.1*  INR  1.46   --    --    --   ,    No results for input(s): PH in the last 72 hours.  Invalid input(s): PCO2, PO2, BASEEXCESS, BASEDEFICITE, TFT    CULTURE RESULTS   Recent Results (from the past 240 hour(s))  Culture, blood (Routine X 2) w Reflex to ID Panel     Status: None (Preliminary result)   Collection Time: 03/11/2015  1:00 PM   Result Value Ref Range Status   Specimen Description BLOOD LEFT ANTECUBITAL  Final   Special Requests BOTTLES DRAWN AEROBIC AND ANAEROBIC  1CC  Final   Culture NO GROWTH < 24 HOURS  Final   Report Status PENDING  Incomplete  Culture, blood (Routine X 2) w Reflex to ID Panel     Status: None (Preliminary result)   Collection Time: 02/25/2015  1:04 PM  Result Value Ref Range Status   Specimen Description BLOOD LEFT HAND  Final   Special Requests BOTTLES DRAWN AEROBIC AND ANAEROBIC  1CC  Final   Culture NO GROWTH < 24 HOURS  Final   Report Status PENDING  Incomplete          IMAGING    Dg Chest 2 View  02/27/2015  CLINICAL DATA:  Shortness of breath and productive cough EXAM: CHEST - 2 VIEW COMPARISON:  None. FINDINGS: Cardiac shadow is mildly enlarged. Thoracic aorta is tortuous. The right lung is clear. The left lung demonstrates mild infiltrate projecting in the left lower lobe posteriorly. No acute bony abnormality is noted. IMPRESSION: Left basilar infiltrate. Electronically Signed   By: Inez Catalina M.D.   On: 03/11/2015 13:53   Dg Chest Port 1 View  03/06/2015  CLINICAL DATA:  75 year old female with cough congestion and shortness of breath. Recently treated for pneumonia. Initial encounter. EXAM: PORTABLE CHEST 1 VIEW COMPARISON:  02/27/2015 FINDINGS: Portable AP upright view at 0843 hours. Worsening volume loss and opacification of the left lung, worst at the left lung base. Superimposed cardiomegaly, stable  mediastinal contours. Lower lung volumes on the right without confluent opacity. No superimposed pneumothorax or large effusion. No acute pulmonary edema. Postoperative changes to the right axilla re- demonstrated. IMPRESSION: Worsening volume loss and opacification of the left lung, consider Mucous Plug and/or increased pneumonia. Electronically Signed   By: Genevie Ann M.D.   On: 03/06/2015 10:39   US Abdomen Limited Ruq  02/13/2015  CLINICAL DATA:  Abdominal pain EXAM: US ABDOMEN  LIMITED - RIGHT UPPER QUADRANT COMPARISON:  None. FINDINGS: Gallbladder: Surgical absent Common bile duct: Diameter: 5.8 mm in diameter within normal limits. Liver: No focal lesion identified. Within normal limits in parenchymal echogenicity. There is atrophic right kidney measures 5.7 cm in length partially visualized. IMPRESSION: 1. Surgical absent gallbladder. Normal CBD. Unremarkable liver. Question right renal atrophy. Electronically Signed   By: Lahoma Crocker M.D.   On: 03/07/2015 15:33    CXR 1/22 images reviewed 03/07/2015    ASSESSMENT/PLAN   75 yo white female with acute COPD exacerbation from acute left sided pneumonia   1.will need to obtain CT chest to assess lung fields 2.start IV steroids 3.dounebs every 4 hrs and pulmicort bid 4.oxygen as needed  Will re-evaluate resp status and follow up on CT chest   I have called family members of patient and it seems that patient was raised as a special needs child and is simple minded I recommend social work and care management team to assess her situation  I have personally obtained a history, examined the patient, evaluated laboratory and independently reviewed imaging results, formulated the assessment and plan and placed orders.  The Patient requires high complexity decision making for assessment and support, frequent evaluation and titration of therapies, application of advanced monitoring technologies and extensive interpretation of multiple databases.    Corrin Parker, M.D.  [facility]  Medical Director [place] Director [facility] Cardio-Pulmonary Department

## 2015-03-07 NOTE — Care Management Note (Signed)
Case Management Note  Patient Details  Name: Julie Anthony MRN: [id number] Date of Birth: Jun 13, 1940  Subjective/Objective:  Patient active with [facility]. TC to [facility], spoke with Dayo, SW. Updated on PT recommendations with home with PT and 24 hour supervision from [facility]. DC date unknown at this time. TC to Mrs. Shadfield. Mrs. Shadfield and her husband have been caring for patient for multiple years. They pay patient bills and cares for the patients home. They provide food and other life essentials. Updated her on PT recommendations.  They are agreeable and want patient  to return home with [facility] services.             Action/Plan: Following progression   Expected Discharge Date:                  Expected Discharge Plan:  [place]  In-House Referral:     Discharge planning Services  CM Consult  Post Acute Care Choice:   ([facility] ) Choice offered to:     DME Arranged:    DME Agency:     HH Arranged:    HH Agency:     Status of Service:  In process, will continue to follow  Medicare Important Message Given:    Date Medicare IM Given:    Medicare IM give by:    Date Additional Medicare IM Given:    Additional Medicare Important Message give by:     If discussed at [place] of Stay Meetings, dates discussed:    Additional Comments:  Jolly Mango, RN 03/07/2015, 10:14 AM

## 2015-03-07 NOTE — Progress Notes (Signed)
[facility]  ROUNDING NOTE   Subjective:   Laying in bed.  Creatinine and potassium now at goal.   Objective:  Vital signs in last 24 hours:  Temp:  [97.5 F (36.4 C)-98 F (36.7 C)] 97.5 F (36.4 C) (01/23 0407) Pulse Rate:  [52-96] 61 (01/23 0912) Resp:  [18] 18 (01/23 0407) BP: (118-149)/(59-76) 149/67 mmHg (01/23 0912) SpO2:  [76 %-100 %] 91 % (01/23 0826) Weight:  [67.495 kg (148 lb 12.8 oz)] 67.495 kg (148 lb 12.8 oz) (01/23 0407)  Weight change: 0.544 kg (1 lb 3.2 oz) Filed Weights   03/04/2015 1235 03/06/15 0525 03/07/15 0407  Weight: 66.951 kg (147 lb 9.6 oz) 67.359 kg (148 lb 8 oz) 67.495 kg (148 lb 12.8 oz)    Intake/Output: I/O last 3 completed shifts: In: [id number] [P.O.:240; I.V.:3420] Out: 928 [Urine:928]   Intake/Output this shift:  Total I/O In: -  Out: 200 [Urine:200]  Physical Exam: General: NAD, laying in bed  Head: Normocephalic, atraumatic. Moist oral mucosal membranes  Eyes: Anicteric, PERRL  Neck: Supple, trachea midline  Lungs:  Clear to auscultation  Heart: Regular rate and rhythm  Abdomen:  Soft, nontender,   Extremities: no peripheral edema.  Neurologic: alert to self and place only.   Skin: No lesions       Basic Metabolic Panel:  Recent Labs Lab 02/21/2015 1300 03/06/15 0206 03/06/15 2006 03/07/15 0904  NA 141 140 135 141  K 3.1* 3.2* 3.4* 4.2  CL 91* 98* 97* 100*  CO2 40* 31 32 34*  GLUCOSE 120* 124* 132* 129*  BUN 54* 55* 50* 47*  CREATININE 2.26* 1.70* 1.14* 0.85  CALCIUM 8.6* 7.8* 7.4* 8.1*    Liver Function Tests:  Recent Labs Lab 03/03/2015 1300 03/06/15 0206 03/06/15 2006  AST 1143* 932* 728*  ALT 1249* 1148* 1105*  ALKPHOS 133* 114 118  BILITOT 0.9 1.1 0.6  PROT 6.8 5.9* 6.0*  ALBUMIN 3.9 3.4* 3.3*   No results for input(s): LIPASE, AMYLASE in the last 168 hours. No results for input(s): AMMONIA in the last 168 hours.  CBC:  Recent Labs Lab 03/08/2015 1300 03/06/15 0206 03/07/15 0904   WBC 12.1* 11.9* 8.3  NEUTROABS 10.4*  --   --   HGB 13.7 12.6 12.3  HCT 41.6 38.7 37.8  MCV 93.9 96.7 94.9  PLT 232 211 145*    Cardiac Enzymes:  Recent Labs Lab 03/02/2015 1300 03/15/2015 2030 03/06/15 0206 03/06/15 0743 03/06/15 2006  CKTOTAL  --   --   --   --  189  TROPONINI 5.55* 3.97* 2.95* 2.46*  --     BNP: Invalid input(s): POCBNP  CBG:  Recent Labs Lab 03/06/15 0729 03/07/15 0745  GLUCAP 61 132*    Microbiology: Results for orders placed or performed during the hospital encounter of 02/13/2015  Culture, blood (Routine X 2) w Reflex to ID Panel     Status: None (Preliminary result)   Collection Time: 03/04/2015  1:00 PM  Result Value Ref Range Status   Specimen Description BLOOD LEFT ANTECUBITAL  Final   Special Requests BOTTLES DRAWN AEROBIC AND ANAEROBIC  1CC  Final   Culture NO GROWTH < 24 HOURS  Final   Report Status PENDING  Incomplete  Culture, blood (Routine X 2) w Reflex to ID Panel     Status: None (Preliminary result)   Collection Time: 02/15/2015  1:04 PM  Result Value Ref Range Status   Specimen Description BLOOD LEFT HAND  Final  Special Requests BOTTLES DRAWN AEROBIC AND ANAEROBIC  1CC  Final   Culture NO GROWTH < 24 HOURS  Final   Report Status PENDING  Incomplete    Coagulation Studies:  Recent Labs  03/01/2015 1300  LABPROT 17.8*  INR 1.46    Urinalysis:  Recent Labs  03/06/15 1529  COLORURINE YELLOW*  LABSPEC 1.018  PHURINE 5.0  GLUCOSEU NEGATIVE  HGBUR 1+*  BILIRUBINUR NEGATIVE  KETONESUR NEGATIVE  PROTEINUR NEGATIVE  NITRITE NEGATIVE  LEUKOCYTESUR NEGATIVE      Imaging: Dg Chest 2 View  02/27/2015  CLINICAL DATA:  Shortness of breath and productive cough EXAM: CHEST - 2 VIEW COMPARISON:  None. FINDINGS: Cardiac shadow is mildly enlarged. Thoracic aorta is tortuous. The right lung is clear. The left lung demonstrates mild infiltrate projecting in the left lower lobe posteriorly. No acute bony abnormality is noted.  IMPRESSION: Left basilar infiltrate. Electronically Signed   By: Inez Catalina M.D.   On: 03/14/2015 13:53   Dg Chest Port 1 View  03/06/2015  CLINICAL DATA:  75 year old female with cough congestion and shortness of breath. Recently treated for pneumonia. Initial encounter. EXAM: PORTABLE CHEST 1 VIEW COMPARISON:  02/17/2015 FINDINGS: Portable AP upright view at 0843 hours. Worsening volume loss and opacification of the left lung, worst at the left lung base. Superimposed cardiomegaly, stable mediastinal contours. Lower lung volumes on the right without confluent opacity. No superimposed pneumothorax or large effusion. No acute pulmonary edema. Postoperative changes to the right axilla re- demonstrated. IMPRESSION: Worsening volume loss and opacification of the left lung, consider Mucous Plug and/or increased pneumonia. Electronically Signed   By: Genevie Ann M.D.   On: 03/06/2015 10:39   US Abdomen Limited Ruq  02/24/2015  CLINICAL DATA:  Abdominal pain EXAM: US ABDOMEN LIMITED - RIGHT UPPER QUADRANT COMPARISON:  None. FINDINGS: Gallbladder: Surgical absent Common bile duct: Diameter: 5.8 mm in diameter within normal limits. Liver: No focal lesion identified. Within normal limits in parenchymal echogenicity. There is atrophic right kidney measures 5.7 cm in length partially visualized. IMPRESSION: 1. Surgical absent gallbladder. Normal CBD. Unremarkable liver. Question right renal atrophy. Electronically Signed   By: Lahoma Crocker M.D.   On: 03/02/2015 15:33     Medications:   . 0.9 % NaCl with KCl 20 mEq / L 100 mL/hr at 03/07/15 0319  . heparin 1,000 Units/hr (03/06/15 0336)   . antiseptic oral rinse  7 mL Mouth Rinse BID  . aspirin EC  81 mg Oral Daily  . atenolol  50 mg Oral QHS  . atorvastatin  20 mg Oral QHS  . aztreonam  1 g Intravenous 3 times per day  . budesonide (PULMICORT) nebulizer solution  0.25 mg Nebulization BID  . cholecalciferol  1,000 Units Oral Daily  . clopidogrel  75 mg Oral  Daily  . docusate sodium  100 mg Oral BID  . DULoxetine  60 mg Oral Daily  . famotidine  20 mg Oral BID  . guaiFENesin  600 mg Oral BID  . ipratropium-albuterol  3 mL Nebulization QID  . isosorbide mononitrate  30 mg Oral Daily  . levofloxacin (LEVAQUIN) IV  250 mg Intravenous Q24H  . levothyroxine  175 mcg Oral QHS  . loratadine  10 mg Oral Daily  . losartan  100 mg Oral Daily  . phenytoin  300 mg Oral QHS  . senna  2 tablet Oral QHS  . sodium chloride  3 mL Intravenous Q12H  . traZODone  50 mg Oral  QHS  . vancomycin  1,000 mg Intravenous Q48H  . verapamil (CALAN-SR) 300 mg  300 mg Oral Daily   bisacodyl, calcium carbonate, morphine injection, ondansetron **OR** ondansetron (ZOFRAN) IV  Assessment/ Plan:  Julie Anthony is a 74 y.o. white  female with history of right breast cancer status post radiation therapy, hypertension, hyperlipidemia, COPD, hypothyroidism, seizure disorder who was admitted to [facility] on 02/13/2015 for evaluation of cough and shortness of breath.   1. Acute renal failure. Suspect related to concurrent illness with left lower lobe pneumonia. - Creatinine has improved with holding losartan and IV fluids - NS with 81mEq KCl.  - Potassium at goal.  - will change IV fluids to NS at 50  2. Hypokalemia: potassium at goal, 4.2   3. Bacterial pneumonia: afebrile. WBC improved.  - vancomycin, aztreonam, and levofloxacin  4. Hypertension: blood pressure at goal - restarted on losartan - continue verapamil, imdur - monitor for bradycardia.   Will sign off, please call with questions.   LOS: 2 Julie Anthony 1/23/201710:40 AM

## 2015-03-08 ENCOUNTER — Inpatient Hospital Stay: Payer: Medicare (Managed Care)

## 2015-03-08 LAB — COMPREHENSIVE METABOLIC PANEL
ALK PHOS: 121 U/L (ref 38–126)
ALT: 1002 U/L — AB (ref 14–54)
ALT: 763 U/L — AB (ref 14–54)
AST: 464 U/L — AB (ref 15–41)
AST: 272 U/L — AB (ref 15–41)
Albumin: 3.1 g/dL — ABNORMAL LOW (ref 3.5–5.0)
Albumin: 3 g/dL — ABNORMAL LOW (ref 3.5–5.0)
Alkaline Phosphatase: 101 U/L (ref 38–126)
Anion gap: 5 (ref 5–15)
Anion gap: 3 — ABNORMAL LOW (ref 5–15)
BILIRUBIN TOTAL: 0.4 mg/dL (ref 0.3–1.2)
BUN: 39 mg/dL — AB (ref 6–20)
BUN: 27 mg/dL — AB (ref 6–20)
CHLORIDE: 102 mmol/L (ref 101–111)
CHLORIDE: 101 mmol/L (ref 101–111)
CO2: 35 mmol/L — AB (ref 22–32)
CO2: 36 mmol/L — ABNORMAL HIGH (ref 22–32)
CREATININE: 0.77 mg/dL (ref 0.44–1.00)
CREATININE: 0.65 mg/dL (ref 0.44–1.00)
Calcium: 8.1 mg/dL — ABNORMAL LOW (ref 8.9–10.3)
Calcium: 7.9 mg/dL — ABNORMAL LOW (ref 8.9–10.3)
GFR calc Af Amer: >60 mL/min (ref >60)
GFR calc non Af Amer: >60 mL/min (ref >60)
Glucose, Bld: 122 mg/dL — ABNORMAL HIGH (ref 65–99)
Glucose, Bld: 176 mg/dL — ABNORMAL HIGH (ref 65–99)
POTASSIUM: 4 mmol/L (ref 3.5–5.1)
Potassium: 4.2 mmol/L (ref 3.5–5.1)
SODIUM: 142 mmol/L (ref 135–145)
Sodium: 140 mmol/L (ref 135–145)
TOTAL PROTEIN: 5.9 g/dL — AB (ref 6.5–8.1)
TOTAL PROTEIN: 5.5 g/dL — AB (ref 6.5–8.1)
Total Bilirubin: 0.6 mg/dL (ref 0.3–1.2)

## 2015-03-08 LAB — BLOOD GAS, ARTERIAL
ACID-BASE EXCESS: 10.5 mmol/L — AB (ref 0.0–3.0)
Allens test (pass/fail): POSITIVE — AB
Bicarbonate: 38.4 mEq/L — ABNORMAL HIGH (ref 21.0–28.0)
O2 SAT: 97.1 %
PATIENT TEMPERATURE: 37
PCO2 ART: 68 mmHg — AB (ref 32.0–48.0)
pH, Arterial: 7.36 (ref 7.350–7.450)
pO2, Arterial: 95 mmHg (ref 83.0–108.0)

## 2015-03-08 LAB — CBC
HCT: 38.4 % (ref 35.0–47.0)
Hemoglobin: 12.3 g/dL (ref 12.0–16.0)
MCH: 30.7 pg (ref 26.0–34.0)
MCHC: 32 g/dL (ref 32.0–36.0)
MCV: 95.9 fL (ref 80.0–100.0)
PLATELETS: 123 10*3/uL — AB (ref 150–440)
RBC: 4.01 MIL/uL (ref 3.80–5.20)
RDW: 15.3 % — AB (ref 11.5–14.5)
WBC: 7 10*3/uL (ref 3.6–11.0)

## 2015-03-08 LAB — GLUCOSE, CAPILLARY: GLUCOSE-CAPILLARY: 118 mg/dL — AB (ref 65–99)

## 2015-03-08 LAB — AMMONIA: AMMONIA: 44 umol/L — AB (ref 9–35)

## 2015-03-08 MED ORDER — VERAPAMIL HCL ER 120 MG PO TBCR
120.0000 mg | EXTENDED_RELEASE_TABLET | Freq: Every day | ORAL | Status: DC
  Administered 2015-03-08 – 2015-03-13 (×5): 120 mg via ORAL
  Filled 2015-03-08 (×6): qty 1

## 2015-03-08 MED ORDER — SODIUM CHLORIDE 0.9 % IV SOLN
500.0000 mg | Freq: Three times a day (TID) | INTRAVENOUS | Status: DC
  Administered 2015-03-08 – 2015-03-09 (×3): 500 mg via INTRAVENOUS
  Filled 2015-03-08 (×5): qty 0.5

## 2015-03-08 MED ORDER — VERAPAMIL HCL ER 120 MG PO TBCR
180.0000 mg | EXTENDED_RELEASE_TABLET | Freq: Every day | ORAL | Status: DC
  Administered 2015-03-08 – 2015-03-13 (×4): 180 mg via ORAL
  Filled 2015-03-08 (×4): qty 1

## 2015-03-08 MED ORDER — DEXTROSE 5 % IV SOLN
2.0000 g | INTRAVENOUS | Status: DC
  Filled 2015-03-08: qty 2

## 2015-03-08 MED ORDER — IPRATROPIUM-ALBUTEROL 0.5-2.5 (3) MG/3ML IN SOLN
3.0000 mL | Freq: Four times a day (QID) | RESPIRATORY_TRACT | Status: DC
  Administered 2015-03-09 – 2015-03-10 (×7): 3 mL via RESPIRATORY_TRACT
  Filled 2015-03-08 (×7): qty 3

## 2015-03-08 NOTE — Progress Notes (Signed)
SUBJECTIVE: The pt is on CPAP but is still confused, thinks she is going home. Appears to be comfortble.   Filed Vitals:   03/08/15 0300 03/08/15 0400 03/08/15 0500 03/08/15 0600  BP: 122/72 83/48 101/56 112/60  Pulse: 54 48 45 56  Temp:  97.5 F (36.4 C)    TempSrc:  Axillary    Resp: 13 19 20 20   Height:      Weight:   151 lb 7.3 oz (68.7 kg)   SpO2: 100% 98% 97% 98%    Intake/Output Summary (Last 24 hours) at 03/08/15 0839 Last data filed at 03/08/15 0600  Gross per 24 hour  Intake   1140 ml  Output    850 ml  Net    290 ml    LABS: Basic Metabolic Panel:  Recent Labs  03/07/15 0904 03/08/15 0446  NA 141 142  K 4.2 4.2  CL 100* 102  CO2 34* 35*  GLUCOSE 129* 122*  BUN 47* 39*  CREATININE 0.85 0.77  CALCIUM 8.1* 8.1*   Liver Function Tests:  Recent Labs  03/06/15 2006 03/08/15 0446  AST 728* 464*  ALT 1105* 1002*  ALKPHOS 118 121  BILITOT 0.6 0.6  PROT 6.0* 5.9*  ALBUMIN 3.3* 3.1*   No results for input(s): LIPASE, AMYLASE in the last 72 hours. CBC:  Recent Labs  02/27/2015 1300  03/07/15 0904 03/08/15 0446  WBC 12.1*  < > 8.3 7.0  NEUTROABS 10.4*  --   --   --   HGB 13.7  < > 12.3 12.3  HCT 41.6  < > 37.8 38.4  MCV 93.9  < > 94.9 95.9  PLT 232  < > 145* 123*  < > = values in this interval not displayed. Cardiac Enzymes:  Recent Labs  03/07/2015 2030 03/06/15 0206 03/06/15 0743 03/06/15 2006  CKTOTAL  --   --   --  189  TROPONINI 3.97* 2.95* 2.46*  --    BNP: Invalid input(s): POCBNP D-Dimer: No results for input(s): DDIMER in the last 72 hours. Hemoglobin A1C: No results for input(s): HGBA1C in the last 72 hours. Fasting Lipid Panel: No results for input(s): CHOL, HDL, LDLCALC, TRIG, CHOLHDL, LDLDIRECT in the last 72 hours. Thyroid Function Tests: No results for input(s): TSH, T4TOTAL, T3FREE, THYROIDAB in the last 72 hours.  Invalid input(s): FREET3 Anemia Panel: No results for input(s): VITAMINB12, FOLATE, FERRITIN, TIBC,  IRON, RETICCTPCT in the last 72 hours.   PHYSICAL EXAM General: Well developed, well nourished, in no acute distress HEENT:  Normocephalic and atramatic Neck:  No JVD.  Lungs: Clear bilaterally to auscultation and percussion. Heart: HRRR . Normal S1 and S2 without gallops or murmurs.  Abdomen: Bowel sounds are positive, abdomen soft and non-tender  Msk:  Back normal, normal gait. Normal strength and tone for age. Extremities: No clubbing, cyanosis or edema.   Neuro: Alert and oriented X 3. Psych:  Good affect, responds appropriately  TELEMETRY: Sinus rhythm  ASSESSMENT AND PLAN: Non STEMI/ renal fialure/dementia. Pt is not a candidate for revascularization. Continue medical therapy and consider Hospice care.  Principal Problem:   NSTEMI (non-ST elevated myocardial infarction) ([place]) Active Problems:   HCAP (healthcare-associated pneumonia)   Abnormal LFTs   ARF (acute renal failure) (HCC)    Julie Anthony A, MD, [facility] 03/08/2015 8:39 AM

## 2015-03-08 NOTE — Progress Notes (Signed)
PULMONARY / CRITICAL CARE MEDICINE   Name: Julie Anthony MRN: [id number] DOB: [birth year]    ADMISSION DATE:  03/03/2015  BRIEF HISTORY: 75 y.o. female has a past medical history significant for CAD, dementia, COPD/asthma, OA and breast cancer who presents to ER with persistent cough, congestion, and SOB for past several days. Was being treated by her PCP for pneumonia with Septra DS. Whe was found to have opacity on left side on CXR. Also noted to have troponin=5.5 c/w NSTEMI. Patient laying in bed has b/l wheezing. Patient states she wants go home. Patient seems to have underlyingdementia unable to provide complete details .Patient is a former smoking  SUBJECTIVE:  On 1/23 afternoon, patient noted to be lethargic, abg obtained, severe resp acidosis noted and patient was transferred to the ICU, placed on bipap, tolerated well overnight.  STUDIES:  1/23 CT Chest - Left upper and left lower lobe pulmonary opacities, suspicious for infection or aspiration. Fluid in the left endobronchial tree could relate to mucous plugging or aspiration. Enlarged PA.  SIGNIFICANT EVENTS: 1/24 - lethargy, severe resp acidosis, transferred to ICU placed on [place]: Temp:  [97.4 F (36.3 C)-98.8 F (37.1 C)] 97.5 F (36.4 C) (01/24 0400) Pulse Rate:  [34-108] 56 (01/24 0600) Resp:  [13-23] 20 (01/24 0600) BP: (69-149)/(44-77) 112/60 mmHg (01/24 0600) SpO2:  [77 %-100 %] 98 % (01/24 0600) Weight:  [151 lb 7.3 oz (68.7 kg)] 151 lb 7.3 oz (68.7 kg) (01/24 0500) HEMODYNAMICS:   VENTILATOR SETTINGS:   INTAKE / OUTPUT:  Intake/Output Summary (Last 24 hours) at 03/08/15 0905 Last data filed at 03/08/15 0600  Gross per 24 hour  Intake   1140 ml  Output    850 ml  Net    290 ml    Review of Systems  Constitutional: Negative for fever and chills.  Eyes: Negative for blurred vision and double vision.  Respiratory: Positive for cough, shortness of breath and wheezing.   Cardiovascular:  Negative for chest pain.  Gastrointestinal: Negative for heartburn, nausea, vomiting and abdominal pain.  Genitourinary: Negative for dysuria.  Musculoskeletal: Negative for myalgias.  Neurological: Positive for weakness.  Endo/Heme/Allergies: Does not bruise/bleed easily.    Physical Exam  Constitutional: She is well-developed, well-nourished, and in no distress.  HENT:  Head: Normocephalic and atraumatic.  Eyes: EOM are normal. Pupils are equal, round, and reactive to light.  Neck: Normal range of motion. Neck supple. No JVD present. No tracheal deviation present. No thyromegaly present.  Cardiovascular: Normal rate, regular rhythm and normal heart sounds.   No murmur heard. Pulmonary/Chest: Effort normal. No respiratory distress. She has wheezes.  Abdominal: Soft. Bowel sounds are normal. She exhibits no distension. There is no tenderness.  Musculoskeletal: Normal range of motion.  Neurological: She is alert. GCS score is 15.  Skin: Skin is warm and dry.  Nursing note and vitals reviewed.    LABS:  CBC  Recent Labs Lab 03/06/15 0206 03/07/15 0904 03/08/15 0446  WBC 11.9* 8.3 7.0  HGB 12.6 12.3 12.3  HCT 38.7 37.8 38.4  PLT 211 145* 123*   Coag's  Recent Labs Lab 02/22/2015 1300  APTT 27  INR 1.46   BMET  Recent Labs Lab 03/06/15 2006 03/07/15 0904 03/08/15 0446  NA 135 141 142  K 3.4* 4.2 4.2  CL 97* 100* 102  CO2 32 34* 35*  BUN 50* 47* 39*  CREATININE 1.14* 0.85 0.77  GLUCOSE 132* 129* 122*   Electrolytes  Recent  Labs Lab 03/06/15 2006 03/07/15 0904 03/08/15 0446  CALCIUM 7.4* 8.1* 8.1*   Sepsis Markers No results for input(s): LATICACIDVEN, PROCALCITON, O2SATVEN in the last 168 hours. ABG  Recent Labs Lab 03/07/15 1636  PHART 7.29*  PCO2ART 82*  PO2ART 68*   Liver Enzymes  Recent Labs Lab 03/06/15 0206 03/06/15 2006 03/08/15 0446  AST 932* 728* 464*  ALT 1148* 1105* 1002*  ALKPHOS 114 118 121  BILITOT 1.1 0.6 0.6   ALBUMIN 3.4* 3.3* 3.1*   Cardiac Enzymes  Recent Labs Lab 02/22/2015 2030 03/06/15 0206 03/06/15 0743  TROPONINI 3.97* 2.95* 2.46*   Glucose  Recent Labs Lab 03/06/15 0729 03/07/15 0745 03/08/15 0848  GLUCAP 98 132* 118*    Imaging Ct Chest Wo Contrast  03/07/2015  CLINICAL DATA:  Pneumonia. Dementia. COPD/ asthma. Breast cancer. Cough. Congestion and shortness of breath for several days. EXAM: CT CHEST WITHOUT CONTRAST TECHNIQUE: Multidetector CT imaging of the chest was performed following the standard protocol without IV contrast. COMPARISON:  Plain films, including 03/06/2015.  No prior CT. FINDINGS: Mediastinum/Nodes: No supraclavicular adenopathy. Tortuous, atherosclerotic thoracic aorta. Moderate cardiomegaly with coronary artery atherosclerosis. Lipomatous hypertrophy of the interatrial septum. Pulmonary artery enlargement, outflow tract 3.9 cm. No gross mediastinal or hilar adenopathy. Lungs/Pleura: Small bilateral pleural effusions. Mild to moderate motion degradation throughout. Suspect mucous plugging or aspiration within left sided endobronchial tree, including on image 24/series 3. Right base atelectasis. Posterior left upper lobe and left lower lobe airspace disease. Upper abdomen: Motion degradation continuing into the upper abdomen. Grossly normal liver, spleen, left kidney. Suspect a posterior gastric diverticulum. Abdominal aortic and branch vessel atherosclerosis. Musculoskeletal: accentuation of expected thoracic kyphosis. Mild T11 compression deformity. IMPRESSION: 1. Mild to moderately motion degraded exam. 2. Left upper and left lower lobe pulmonary opacities, suspicious for infection or aspiration. Fluid in the left endobronchial tree could relate to mucous plugging or aspiration. 3. Cardiomegaly. Atherosclerosis, including within the coronary arteries. 4. Small bilateral pleural effusions. 5. Pulmonary artery enlargement suggests pulmonary arterial hypertension.  Electronically Signed   By: Abigail Miyamoto M.D.   On: 03/07/2015 14:34   Dg Chest Port 1 View  03/08/2015  CLINICAL DATA:  Pneumonia. EXAM: PORTABLE CHEST 1 VIEW COMPARISON:  03/07/2015. FINDINGS: Worsening volume loss and consolidation left lung. Right lung is clear. Cardiomegaly. Small pleural effusions cannot be excluded. No pneumothorax . Surgical clips right axilla. IMPRESSION: Worsening volume loss and consolidation throughout the left lung. Consider a left endobronchial obstructing process such as an obstructing mucous plug. Electronically Signed   By: Marcello Moores  Register   On: 03/08/2015 07:40    LINES:   CULTURES: Bld Cx x 2 1/21 >>negative to date MRSA >>negative Sputum Cx 1/22>>  ANTIBIOTICS Levaquin 1/21>> Meropenem 1/24>> Vanc 1/21>>1/24 Aztreonam 1/21>>1/24  ASSESSMENT / PLAN: 75 yo female with PMHx of COPD on 2L, Dementia, CAD, admitted for NSTEMI and AECOPD.  Noted to have LLL opacity, worsening respiratory distress and transferred to ICU on 1/23  AECOPD - cont with supplemental O2, maintain O2 sats >88% - cont with steroids and nebs - Bipap Qhs, min 4-5 hrs, Bipap PRN during the day - check ABG today - chest physiotherapy - f/u sputum cultures   LLL opacity - CT chest review  - aspiration vs infection vs mucus pluggin  - cont with current abx - f/u sputum cultures - chest physiotherapy, spirometry and flutter valve.   Multifocal Pneumonia - plan as stated above - antibiotics as stated above  NSTEMI - CE  if needed - cont with asa, heparin, nitrates, BP control   Thank you for consulting [facility], Please feel free to contacts [place] with any questions at [phone] (please enter 7-digits).  I have personally obtained a history, examined the patient, evaluated laboratory and imaging results, formulated the assessment and plan and placed orders.  The Patient requires high complexity decision making for assessment and support,  frequent evaluation and titration of therapies, application of advanced monitoring technologies and extensive interpretation of multiple databases. Critical Care Time devoted to patient care services described in this note is 45 minutes.    Vilinda Boehringer, MD [facility] Pager [phone] (please enter 7-digits) On Call Pager [phone] (please enter 7-digits)  Note: This note was prepared with Dragon dictation along with smaller phrase technology. Any transcriptional errors that result from this process are unintentional.

## 2015-03-08 NOTE — Progress Notes (Signed)
Speech Language Pathology Treatment: Dysphagia  Patient Details Name: MARESSA TUNGATE MRN: [id number] DOB: 08/13/1940 Today's Date: 03/08/2015 Time: [id number] SLP Time Calculation (min) (ACUTE ONLY): 25 min  Assessment / Plan / Recommendation Clinical Impression  Pt appears to be tolerating his current diet of Dys. 2 w/ thin liquids w/ no reported, overt s/s of aspiration by staff. Pt requires some feeding assist; no decline in respiratory status or vocal quality w/ po trials. Pt required min. verbal cues during tasks; attend to tasks. Education given on general aspiration precautions and need to monitor her breathing/respiratory status during any oral intake to limit fatigue w/ po's. Rec. Continue w/ current Dys. 2 diet w/ thin liquids. Aspiration precautions; meds in puree if nec. - crushed as nec./able. NSG updated on pt's swallowing status and aspiration precautions. ST will be available for any further education on her diet consistency and aspiration precautions while admitted. Dietician following.     HPI HPI: Pt is a 75 y.o. female has a past medical history significant for CAD, Dementia, COPD/asthma, OA and breast cancer w/ radiation who presents to ER with persistent cough, congestion, and SOB. Was being treated by her PCP for pneumonia with Septra DS. In ER, pt found to have persistent LLL pneumonia with ARF and abnormal LFT's. Also noted to have troponin=5.5 c/w NSTEMI. She denies CP. Pt is currently on a dys. 3 w/ thin liquids. Pt denies any coughing, choking, or strangling when eating/drinking at meals. Presently, pt has been swallowing pills w/ liquids w/ NSG who deny any overt s/s of aspiration. She can feed herself at meals w/ setup and min. assist. Noted CXR and labs. Pt was transferred to CCU w/ CO2 elevated and was placed on BiPAP. Pt has weaned from BiPAP now and tolerating Delta. NSG has noted no overt s/s of aspiration w/ po trials at meal/meds today. Pt is on a Dys. 2 diet w/ thin  liquids.       SLP Plan  Continue with current plan of care     Recommendations  Diet recommendations: Dysphagia 2 (fine chop);Thin liquid Liquids provided via: Straw;Cup Medication Administration: Whole meds with liquid (or in puree as Delma Post. ) Supervision: Staff to assist with self feeding;Intermittent supervision to cue for compensatory strategies Compensations: Minimize environmental distractions;Slow rate;Small sips/bites Postural Changes and/or Swallow Maneuvers: Seated upright 90 degrees             Oral Care Recommendations: Oral care BID;Staff/trained caregiver to provide oral care Follow up Recommendations:  (TBD) Plan: Continue with current plan of care     Perry, MS, CCC-SLP  Watson,Katherine 03/08/2015, 3:45 PM

## 2015-03-08 NOTE — Progress Notes (Signed)
PT Cancellation Note  Patient Details Name: Julie Anthony MRN: [id number] DOB: Dec 02, 1940   Cancelled Treatment:    Reason Eval/Treat Not Completed: Other (comment) (Pt transferred to CCU (change in status)).  Nursing notified PT requiring new PT orders/consult d/t transfer to CCU (and PT to discontinue current PT order d/t this change in status).  Please reconsult PT when pt is medically appropriate to participate in PT.   Raquel Sarna Dalayza Zambrana 03/08/2015, 4:47 PM Leitha Bleak, [place]

## 2015-03-08 NOTE — Progress Notes (Signed)
Pt. Was removed from the bipap and placed on 4L nasal cannula.

## 2015-03-08 NOTE — Progress Notes (Signed)
[facility] [place] NAME: Julie Anthony    MR#:  [id number]  DATE OF BIRTH:  12-19-1940  SUBJECTIVE:  CHIEF COMPLAINT:   Chief Complaint  Patient presents with  . Dysphagia  . Cough   the patient is a 75 year old Caucasian female with history of dementia, coronary artery disease, COPD, asthma, chronic respiratory failure on 2 L of oxygen through nasal cannula at home, who lives alone presents to the hospital with complaints of cough, chest congestion as well as shortness of breath. In emergency room, she was noted to have elevated troponin to 5.5, but had no chest pain. Patient's chest x-ray was concerning for left basilar infiltrate, repeat a chest x-ray today revealed worsening pneumonia versus atelectasis , concerning for mucous plugging.  Cardiologist does not recommend any interventions, but conservative therapy.   CT of chest on the 23rd of January revealed left upper and left lower lobe pulmonary opacity suspicious for infection or aspiration, pulmonary artery enlargement suggesting pulmonary hypertension. Patient was transferred to intensive care unit yesterday because of worsening hypoxemia, she was initiated on BiPAP, but now she is weaned off. It. Recommended pulmonary PT, percussive therapy/bed. Patient has been coughing and although initially in the morning her O2 sats remained very low in 60s to 80s, they have improved, as day progressed   Review of Systems  Constitutional: Negative for fever, chills and weight loss.  HENT: Negative for congestion.   Eyes: Negative for blurred vision and double vision.  Respiratory: Positive for cough. Negative for hemoptysis, sputum production, shortness of breath and wheezing.   Cardiovascular: Negative for chest pain, palpitations, orthopnea, leg swelling and PND.  Gastrointestinal: Negative for nausea, vomiting, abdominal pain, diarrhea, constipation and blood in stool.  Genitourinary:  Negative for dysuria, urgency, frequency and hematuria.  Musculoskeletal: Negative for falls.  Neurological: Negative for dizziness, tremors, focal weakness and headaches.  Endo/Heme/Allergies: Does not bruise/bleed easily.  Psychiatric/Behavioral: Negative for depression. The patient does not have insomnia.     VITAL SIGNS: Blood pressure 119/102, pulse 71, temperature 98 F (36.7 C), temperature source Axillary, resp. rate 18, height 5\' 5"  (1.651 m), weight 68.7 kg (151 lb 7.3 oz), SpO2 100 %.  PHYSICAL EXAMINATION:   GENERAL:  75 y.o.-year-old patient lying in the bed with no acute distress.  EYES: Pupils equal, round, reactive to light and accommodation. No scleral icterus. Extraocular muscles intact.  HEENT: Head atraumatic, normocephalic. Oropharynx and nasopharynx clear.  NECK:  Supple, no jugular venous distention. No thyroid enlargement, no tenderness.  LUNGS:Diminished breath sounds on the right, mostly at the bases, relatively good air entrance on the left , rales,rhonchi  and wheezing were noted bilaterally . No use of accessory muscles of respiration. Kyphosis  CARDIOVASCULAR: S1, S2 , bradycardic, regular. No murmurs, rubs, or gallops.  ABDOMEN: Soft, nontender, nondistended. Bowel sounds present. No organomegaly or mass.  EXTREMITIES: No pedal edema, cyanosis, or clubbing.  NEUROLOGIC: Cranial nerves II through XII are intact. Muscle strength 5/5 in all extremities. Sensation intact. Gait not checked.  PSYCHIATRIC: The patient is alert and oriented x 3.  SKIN: No obvious rash, lesion, or ulcer.   ORDERS/RESULTS REVIEWED:   CBC  Recent Labs Lab 03/01/2015 1300 03/06/15 0206 03/07/15 0904 03/08/15 0446  WBC 12.1* 11.9* 8.3 7.0  HGB 13.7 12.6 12.3 12.3  HCT 41.6 38.7 37.8 38.4  PLT 232 211 145* 123*  MCV 93.9 96.7 94.9 95.9  MCH 31.0 31.6 30.9 30.7  MCHC  33.0 32.6 32.6 32.0  RDW 14.7* 15.0* 15.4* 15.3*  LYMPHSABS 0.8*  --   --   --   MONOABS 0.9  --   --   --    EOSABS 0.0  --   --   --   BASOSABS 0.0  --   --   --    ------------------------------------------------------------------------------------------------------------------  Chemistries   Recent Labs Lab 02/19/2015 1300 03/06/15 0206 03/06/15 2006 03/07/15 0904 03/08/15 0446  NA 141 140 135 141 142  K 3.1* 3.2* 3.4* 4.2 4.2  CL 91* 98* 97* 100* 102  CO2 40* 31 32 34* 35*  GLUCOSE 120* 124* 132* 129* 122*  BUN 54* 55* 50* 47* 39*  CREATININE 2.26* 1.70* 1.14* 0.85 0.77  CALCIUM 8.6* 7.8* 7.4* 8.1* 8.1*  AST 1143* 932* 728*  --  464*  ALT 1249* 1148* 1105*  --  1002*  ALKPHOS 133* 114 118  --  121  BILITOT 0.9 1.1 0.6  --  0.6   ------------------------------------------------------------------------------------------------------------------ estimated creatinine clearance is 58.3 mL/min (by C-G formula based on Cr of 0.77). ------------------------------------------------------------------------------------------------------------------ No results for input(s): TSH, T4TOTAL, T3FREE, THYROIDAB in the last 72 hours.  Invalid input(s): FREET3  Cardiac Enzymes  Recent Labs Lab 02/18/2015 2030 03/06/15 0206 03/06/15 0743  TROPONINI 3.97* 2.95* 2.46*   ------------------------------------------------------------------------------------------------------------------ Invalid input(s): POCBNP ---------------------------------------------------------------------------------------------------------------  RADIOLOGY: Ct Chest Wo Contrast  03/07/2015  CLINICAL DATA:  Pneumonia. Dementia. COPD/ asthma. Breast cancer. Cough. Congestion and shortness of breath for several days. EXAM: CT CHEST WITHOUT CONTRAST TECHNIQUE: Multidetector CT imaging of the chest was performed following the standard protocol without IV contrast. COMPARISON:  Plain films, including 03/06/2015.  No prior CT. FINDINGS: Mediastinum/Nodes: No supraclavicular adenopathy. Tortuous, atherosclerotic thoracic aorta.  Moderate cardiomegaly with coronary artery atherosclerosis. Lipomatous hypertrophy of the interatrial septum. Pulmonary artery enlargement, outflow tract 3.9 cm. No gross mediastinal or hilar adenopathy. Lungs/Pleura: Small bilateral pleural effusions. Mild to moderate motion degradation throughout. Suspect mucous plugging or aspiration within left sided endobronchial tree, including on image 24/series 3. Right base atelectasis. Posterior left upper lobe and left lower lobe airspace disease. Upper abdomen: Motion degradation continuing into the upper abdomen. Grossly normal liver, spleen, left kidney. Suspect a posterior gastric diverticulum. Abdominal aortic and branch vessel atherosclerosis. Musculoskeletal: accentuation of expected thoracic kyphosis. Mild T11 compression deformity. IMPRESSION: 1. Mild to moderately motion degraded exam. 2. Left upper and left lower lobe pulmonary opacities, suspicious for infection or aspiration. Fluid in the left endobronchial tree could relate to mucous plugging or aspiration. 3. Cardiomegaly. Atherosclerosis, including within the coronary arteries. 4. Small bilateral pleural effusions. 5. Pulmonary artery enlargement suggests pulmonary arterial hypertension. Electronically Signed   By: Abigail Miyamoto M.D.   On: 03/07/2015 14:34   Dg Chest Port 1 View  03/08/2015  CLINICAL DATA:  Pneumonia. EXAM: PORTABLE CHEST 1 VIEW COMPARISON:  03/07/2015. FINDINGS: Worsening volume loss and consolidation left lung. Right lung is clear. Cardiomegaly. Small pleural effusions cannot be excluded. No pneumothorax . Surgical clips right axilla. IMPRESSION: Worsening volume loss and consolidation throughout the left lung. Consider a left endobronchial obstructing process such as an obstructing mucous plug. Electronically Signed   By: Marcello Moores  Register   On: 03/08/2015 07:40    EKG:  Orders placed or performed during the hospital encounter of 03/04/2015  . ED EKG  . ED EKG    ASSESSMENT AND  PLAN:  Principal Problem:   NSTEMI (non-ST elevated myocardial infarction) ([place]) Active Problems:   HCAP (healthcare-associated pneumonia)  Abnormal LFTs   ARF (acute renal failure) ([place]) 1. Acute on chronic respiratory failure with hypoxia due to left lower lobe pneumonia, concerning for aspiration pneumonia versus mucus plugging, refused to use flutter valve, appreciate pulmonologist input, patient was on BiPAP now on oxygen through nasal cannulas after receiving pulmonary PT , continue oxygen therapy as needed nebulizers as well as antibiotics 2. Left lower lobe pneumonia, continue meropenem and levofloxacin, sputum cultures are pending.  Continue Humibid. Pulmonologist recommends IV steroids and Pulmicort, some better today, although oxygenation remains poor 3. Acute renal failure, resolved with IV fluid administration, unremarkable urinalysis 4. Leukocytosis, resolved 5. Non-Q-wave MI, type 2, due to demand ischemia, per cardiologist, continue aspirin, heparin , nitrates, atenolol 6. Elevated transaminases of unclear etiology, suspending statin. Ultrasound of abdomen revealed a normal common bile duct and unremarkable liver. Following LFTs in the morning  Management plans discussed with the patient, family and they are in agreement.   DRUG ALLERGIES:  Allergies  Allergen Reactions  . Penicillins Rash  . Codeine Other (See Comments)    Unknown reaction.  . Ceftin [Cefuroxime Axetil] Hives    Unknown reaction  . Erythromycin Other (See Comments)    Unknown reaction  . Ibuprofen Other (See Comments)    Unknown reaction  . Sulfa Antibiotics Other (See Comments)    Unknown reaction    CODE STATUS:     Code Status Orders        Start     Ordered   03/04/2015 1930  Full code   Continuous     02/27/2015 1929    Code Status History    Date Active Date Inactive Code Status Order ID Comments User Context   This patient has a current code status but no historical code status.       TOTAL  CRITICAL CARE TIME TAKING CARE OF THIS PATIENT: 40 minutes.    Theodoro Grist M.D on 03/08/2015 at 4:43 PM  Between 7am to 6pm - Pager - [phone]  After 6pm go to [email or web address] - password [email or web address] [facility]  Office  [phone]  CC: Primary care physician; [place]

## 2015-03-08 NOTE — Progress Notes (Signed)
ANTIBIOTIC CONSULT NOTE - FOLLOW UP   Pharmacy Consult for Antibiotic Dosing Day 4 (levofloxacin Day 3/ meropenem Day 1)   Indication: pneumonia  Allergies  Allergen Reactions  . Penicillins Rash  . Codeine Other (See Comments)    Unknown reaction.  . Ceftin [Cefuroxime Axetil] Hives    Unknown reaction  . Erythromycin Other (See Comments)    Unknown reaction  . Ibuprofen Other (See Comments)    Unknown reaction  . Sulfa Antibiotics Other (See Comments)    Unknown reaction    Patient Measurements: Height: 5\' 5"  (165.1 cm) Weight: 151 lb 7.3 oz (68.7 kg) IBW/kg (Calculated) : 57 Adjusted Body Weight: 61 kg   Vital Signs: Temp: 97.8 F (36.6 C) (01/24 0800) Temp Source: Axillary (01/24 0800) BP: 134/53 mmHg (01/24 1000) Pulse Rate: 70 (01/24 1000) Intake/Output from previous day: 01/23 0701 - 01/24 0700 In: 1190 [P.O.:390; I.V.:550; IV Piggyback:250] Out: [id number] [Urine:875] Intake/Output from this shift: Total I/O In: 243 [P.O.:240; I.V.:3] Out: 200 [Urine:200]  Labs:  Recent Labs  03/06/15 0206 03/06/15 2006 03/07/15 0904 03/08/15 0446  WBC 11.9*  --  8.3 7.0  HGB 12.6  --  12.3 12.3  PLT 211  --  145* 123*  CREATININE 1.70* 1.14* 0.85 0.77   Estimated Creatinine Clearance: 58.3 mL/min (by C-G formula based on Cr of 0.77). No results for input(s): VANCOTROUGH, VANCOPEAK, VANCORANDOM, GENTTROUGH, GENTPEAK, GENTRANDOM, TOBRATROUGH, TOBRAPEAK, TOBRARND, AMIKACINPEAK, AMIKACINTROU, AMIKACIN in the last 72 hours.   Microbiology: Recent Results (from the past 720 hour(s))  Culture, blood (Routine X 2) w Reflex to ID Panel     Status: None (Preliminary result)   Collection Time: 03/06/2015  1:00 PM  Result Value Ref Range Status   Specimen Description BLOOD LEFT ANTECUBITAL  Final   Special Requests BOTTLES DRAWN AEROBIC AND ANAEROBIC  1CC  Final   Culture NO GROWTH 3 DAYS  Final   Report Status PENDING  Incomplete  Culture, blood (Routine X 2) w Reflex to ID  Panel     Status: None (Preliminary result)   Collection Time: 03/10/2015  1:04 PM  Result Value Ref Range Status   Specimen Description BLOOD LEFT HAND  Final   Special Requests BOTTLES DRAWN AEROBIC AND ANAEROBIC  1CC  Final   Culture NO GROWTH 3 DAYS  Final   Report Status PENDING  Incomplete  MRSA PCR Screening     Status: None   Collection Time: 03/07/15  5:32 PM  Result Value Ref Range Status   MRSA by PCR NEGATIVE NEGATIVE Final    Comment:        The GeneXpert MRSA Assay (FDA approved for NASAL specimens only), is one component of a comprehensive MRSA colonization surveillance program. It is not intended to diagnose MRSA infection nor to guide or monitor treatment for MRSA infections.     Medical History: Past Medical History  Diagnosis Date  . Cancer [facility])     right breast cancer  . S/P radiation therapy     right breast cancer    Medications:  Prescriptions prior to admission  Medication Sig Dispense Refill Last Dose  . acetaminophen (TYLENOL ARTHRITIS PAIN) 650 MG CR tablet Take 1,300 mg by mouth 2 (two) times daily.   unknown  . albuterol (PROVENTIL HFA;VENTOLIN HFA) 108 (90 Base) MCG/ACT inhaler Inhale 2 puffs into the lungs See admin instructions. Inhale 2 puffs every 4 to 6 hours as needed for shortness of breath.   prn  . albuterol (PROVENTIL) (  2.5 MG/3ML) 0.083% nebulizer solution Take 2.5 mg by nebulization 4 (four) times daily as needed for wheezing or shortness of breath (cough).   prn  . aspirin EC 81 MG tablet Take 81 mg by mouth at bedtime.   unknown  . atenolol (TENORMIN) 50 MG tablet Take 50 mg by mouth at bedtime.   unknown  . atorvastatin (LIPITOR) 20 MG tablet Take 20 mg by mouth at bedtime.   unknown  . [EXPIRED] azithromycin (ZITHROMAX) 250 MG tablet Take 250-500 mg by mouth See admin instructions. Take 2 tablets (500mg ) by mouth now, then 1 tablet by mouth every day x 4 days.   unknown  . calcium carbonate (TUMS) 500 MG chewable tablet Chew  1,000 mg by mouth every 2 (two) hours as needed for indigestion or heartburn. *Maximum 10 tablets a day*   unknown  . Carboxymethylcellul-Glycerin 0.5-0.9 % SOLN Apply 1-2 drops to eye 3 (three) times daily as needed (for dry eye.).   prn  . cetirizine (ZYRTEC) 10 MG tablet Take 10 mg by mouth at bedtime.   unknown  . Cholecalciferol 1000 units capsule Take 1,000 Units by mouth daily.   unknown  . diclofenac sodium (VOLTAREN) 1 % GEL Apply 2-4 g topically See admin instructions. Apply 2 grams to affected area above waist, and 4 grams to affected area below the waist up to 4 times as needed for pain.   unknown  . docusate sodium (COLACE) 100 MG capsule Take 100 mg by mouth daily.   unknown  . DULoxetine (CYMBALTA) 60 MG capsule Take 60 mg by mouth daily.   unknown  . fluticasone (FLOVENT DISKUS) 50 MCG/BLIST diskus inhaler Inhale 2 puffs into the lungs 2 (two) times daily.   unknown  . levothyroxine (SYNTHROID, LEVOTHROID) 175 MCG tablet Take 175 mcg by mouth at bedtime.   unknown  . losartan-hydrochlorothiazide (HYZAAR) 100-25 MG tablet Take 1 tablet by mouth daily.   unknown  . phenytoin (DILANTIN) 100 MG ER capsule Take 300 mg by mouth at bedtime.   unknown  . [EXPIRED] predniSONE (DELTASONE) 20 MG tablet Take 40 mg by mouth daily. X 5 days.   unknown  . ranitidine (ZANTAC) 150 MG capsule Take 150 mg by mouth 2 (two) times daily.   unknown  . senna (SENOKOT) 8.6 MG tablet Take 2 tablets by mouth at bedtime.   unknown  . sodium chloride (NASAL MOIST) 0.65 % nasal spray Place 2 sprays into the nose every 2 (two) hours as needed for congestion.   prn  . traZODone (DESYREL) 50 MG tablet Take 50 mg by mouth at bedtime.   unknown  . Verapamil HCl CR 300 MG CP24 Take 300 mg by mouth daily.   unknown   Assessment: Pharmacy consulted to dose meropenem and levofloxacin for 75 yo female being treated for worsening PNA.     Plan:  Will continue patient on levofloxacin 500mg  IV Q24hr. Will initiate  patient on meropenem 500mg  IV Q8hr.   Pharmacy will continue to monitor and adjust per consult.   Neilani Duffee L 03/08/2015,3:31 PM

## 2015-03-09 LAB — GLUCOSE, CAPILLARY
Glucose-Capillary: 104 mg/dL — ABNORMAL HIGH (ref 65–99)
Glucose-Capillary: 146 mg/dL — ABNORMAL HIGH (ref 65–99)

## 2015-03-09 LAB — PLATELET COUNT: Platelets: 131 10*3/uL — ABNORMAL LOW (ref 150–440)

## 2015-03-09 MED ORDER — LEVOFLOXACIN 500 MG PO TABS
500.0000 mg | ORAL_TABLET | Freq: Every day | ORAL | Status: DC
  Administered 2015-03-09: 500 mg via ORAL
  Filled 2015-03-09: qty 1

## 2015-03-09 MED ORDER — MEROPENEM 1 G IV SOLR
1.0000 g | Freq: Three times a day (TID) | INTRAVENOUS | Status: AC
  Administered 2015-03-09 – 2015-03-17 (×25): 1 g via INTRAVENOUS
  Filled 2015-03-09 (×28): qty 1

## 2015-03-09 NOTE — Progress Notes (Signed)
SUBJECTIVE: No chest pain  Filed Vitals:   03/08/15 2200 03/08/15 2300 03/09/15 0553 03/09/15 0756  BP: 119/95 87/50 154/75   Pulse: 69 57 70   Temp:   98.5 F (36.9 C)   TempSrc:   Oral   Resp: 19 18 16    Height:      Weight:   152 lb 3.2 oz (69.037 kg)   SpO2: 95% 95% 96% 96%    Intake/Output Summary (Last 24 hours) at 03/09/15 1205 Last data filed at 03/09/15 0830  Gross per 24 hour  Intake 1620.83 ml  Output    375 ml  Net 1245.83 ml    LABS: Basic Metabolic Panel:  Recent Labs  03/08/15 0446 03/08/15 1939  NA 142 140  K 4.2 4.0  CL 102 101  CO2 35* 36*  GLUCOSE 122* 176*  BUN 39* 27*  CREATININE 0.77 0.65  CALCIUM 8.1* 7.9*   Liver Function Tests:  Recent Labs  03/08/15 0446 03/08/15 1939  AST 464* 272*  ALT 1002* 763*  ALKPHOS 121 101  BILITOT 0.6 0.4  PROT 5.9* 5.5*  ALBUMIN 3.1* 3.0*   No results for input(s): LIPASE, AMYLASE in the last 72 hours. CBC:  Recent Labs  03/07/15 0904 03/08/15 0446 03/09/15 0434  WBC 8.3 7.0  --   HGB 12.3 12.3  --   HCT 37.8 38.4  --   MCV 94.9 95.9  --   PLT 145* 123* 131*   Cardiac Enzymes:  Recent Labs  03/06/15 2006  CKTOTAL 189   BNP: Invalid input(s): POCBNP D-Dimer: No results for input(s): DDIMER in the last 72 hours. Hemoglobin A1C: No results for input(s): HGBA1C in the last 72 hours. Fasting Lipid Panel: No results for input(s): CHOL, HDL, LDLCALC, TRIG, CHOLHDL, LDLDIRECT in the last 72 hours. Thyroid Function Tests: No results for input(s): TSH, T4TOTAL, T3FREE, THYROIDAB in the last 72 hours.  Invalid input(s): FREET3 Anemia Panel: No results for input(s): VITAMINB12, FOLATE, FERRITIN, TIBC, IRON, RETICCTPCT in the last 72 hours.   PHYSICAL EXAM General: Well developed, well nourished, in no acute distress HEENT:  Normocephalic and atramatic Neck:  No JVD.  Lungs: Clear bilaterally to auscultation and percussion. Heart: HRRR . Normal S1 and S2 without gallops or murmurs.   Abdomen: Bowel sounds are positive, abdomen soft and non-tender  Msk:  Back normal, normal gait. Normal strength and tone for age. Extremities: No clubbing, cyanosis or edema.   Neuro: Alert and oriented X 3. Psych:  Good affect, responds appropriately  TELEMETRY: Sinus rhythm  ASSESSMENT AND PLAN: Non-STEMI and renal failure advise conservative treatment. Patient clinically doing well.  Principal Problem:   NSTEMI (non-ST elevated myocardial infarction) ([place]) Active Problems:   HCAP (healthcare-associated pneumonia)   Abnormal LFTs   ARF (acute renal failure) ([facility])    Mishti Swanton A, MD, [facility] 03/09/2015 12:05 PM

## 2015-03-09 NOTE — Progress Notes (Signed)
[place] [facility] [place] NAME: Julie Anthony    MR#:  [id number]  DATE OF BIRTH:  Feb 09, 1941  SUBJECTIVE:  CHIEF COMPLAINT:   Chief Complaint  Patient presents with  . Dysphagia  . Cough   the patient is a 75 year old Caucasian female with history of dementia, coronary artery disease, COPD, asthma, chronic respiratory failure on 2 L of oxygen through nasal cannula at home, who lives alone presents to the hospital with complaints of cough, chest congestion as well as shortness of breath. In emergency room, she was noted to have elevated troponin to 5.5, but had no chest pain. Patient's chest x-ray was concerning for left basilar infiltrate, repeat a chest x-ray today revealed worsening pneumonia versus atelectasis , concerning for mucous plugging.  Cardiologist does not recommend any interventions, but conservative therapy.   CT of chest on the 23rd of January revealed left upper and left lower lobe pulmonary opacity suspicious for infection or aspiration, pulmonary artery enlargement suggesting pulmonary hypertension. Patient was transferred to intensive care unit yesterday because of worsening hypoxemia, she was initiated on BiPAP, but now she is weaned off. It. Recommended pulmonary PT, percussive therapy/bed. Patient has been coughing and patient's O2 sats improved and now she is on 3 L of oxygen through nasal cannula with saturations of 97%. LFTs had improved since  patient's statin was stopped  Review of Systems  Constitutional: Negative for fever, chills and weight loss.  HENT: Negative for congestion.   Eyes: Negative for blurred vision and double vision.  Respiratory: Positive for cough. Negative for hemoptysis, sputum production, shortness of breath and wheezing.   Cardiovascular: Negative for chest pain, palpitations, orthopnea, leg swelling and PND.  Gastrointestinal: Negative for nausea, vomiting, abdominal pain, diarrhea, constipation  and blood in stool.  Genitourinary: Negative for dysuria, urgency, frequency and hematuria.  Musculoskeletal: Negative for falls.  Neurological: Negative for dizziness, tremors, focal weakness and headaches.  Endo/Heme/Allergies: Does not bruise/bleed easily.  Psychiatric/Behavioral: Negative for depression. The patient does not have insomnia.     VITAL SIGNS: Blood pressure 121/48, pulse 71, temperature 98 F (36.7 C), temperature source Oral, resp. rate 18, height 5\' 5"  (1.651 m), weight 69.037 kg (152 lb 3.2 oz), SpO2 97 %.  PHYSICAL EXAMINATION:   GENERAL:  75 y.o.-year-old patient lying in the bed with no acute distress.  EYES: Pupils equal, round, reactive to light and accommodation. No scleral icterus. Extraocular muscles intact.  HEENT: Head atraumatic, normocephalic. Oropharynx and nasopharynx clear.  NECK:  Supple, no jugular venous distention. No thyroid enlargement, no tenderness.  LUNGS:Diminished breath sounds on the right, mostly at the base, relatively good air entrance on the left ,  Few rhonchi  were noted bilaterally . No use of accessory muscles of respiration. Kyphosis  CARDIOVASCULAR: S1, S2 , bradycardic, regular. No murmurs, rubs, or gallops.  ABDOMEN: Soft, nontender, nondistended. Bowel sounds present. No organomegaly or mass.  EXTREMITIES: No pedal edema, cyanosis, or clubbing.  NEUROLOGIC: Cranial nerves II through XII are intact. Muscle strength 5/5 in all extremities. Sensation intact. Gait not checked.  PSYCHIATRIC: The patient is alert and oriented x 3.  SKIN: No obvious rash, lesion, or ulcer.   ORDERS/RESULTS REVIEWED:   CBC  Recent Labs Lab 02/26/2015 1300 03/06/15 0206 03/07/15 0904 03/08/15 0446 03/09/15 0434  WBC 12.1* 11.9* 8.3 7.0  --   HGB 13.7 12.6 12.3 12.3  --   HCT 41.6 38.7 37.8 38.4  --   PLT  232 211 145* 123* 131*  MCV 93.9 96.7 94.9 95.9  --   MCH 31.0 31.6 30.9 30.7  --   MCHC 33.0 32.6 32.6 32.0  --   RDW 14.7* 15.0* 15.4*  15.3*  --   LYMPHSABS 0.8*  --   --   --   --   MONOABS 0.9  --   --   --   --   EOSABS 0.0  --   --   --   --   BASOSABS 0.0  --   --   --   --    ------------------------------------------------------------------------------------------------------------------  Chemistries   Recent Labs Lab 02/19/2015 1300 03/06/15 0206 03/06/15 2006 03/07/15 0904 03/08/15 0446 03/08/15 1939  NA 141 140 135 141 142 140  K 3.1* 3.2* 3.4* 4.2 4.2 4.0  CL 91* 98* 97* 100* 102 101  CO2 40* 31 32 34* 35* 36*  GLUCOSE 120* 124* 132* 129* 122* 176*  BUN 54* 55* 50* 47* 39* 27*  CREATININE 2.26* 1.70* 1.14* 0.85 0.77 0.65  CALCIUM 8.6* 7.8* 7.4* 8.1* 8.1* 7.9*  AST 1143* 932* 728*  --  464* 272*  ALT 1249* 1148* 1105*  --  1002* 763*  ALKPHOS 133* 114 118  --  121 101  BILITOT 0.9 1.1 0.6  --  0.6 0.4   ------------------------------------------------------------------------------------------------------------------ estimated creatinine clearance is 58.4 mL/min (by C-G formula based on Cr of 0.65). ------------------------------------------------------------------------------------------------------------------ No results for input(s): TSH, T4TOTAL, T3FREE, THYROIDAB in the last 72 hours.  Invalid input(s): FREET3  Cardiac Enzymes  Recent Labs Lab 02/14/2015 2030 03/06/15 0206 03/06/15 0743  TROPONINI 3.97* 2.95* 2.46*   ------------------------------------------------------------------------------------------------------------------ Invalid input(s): POCBNP ---------------------------------------------------------------------------------------------------------------  RADIOLOGY: Dg Chest Port 1 View  03/08/2015  CLINICAL DATA:  Pneumonia. EXAM: PORTABLE CHEST 1 VIEW COMPARISON:  03/07/2015. FINDINGS: Worsening volume loss and consolidation left lung. Right lung is clear. Cardiomegaly. Small pleural effusions cannot be excluded. No pneumothorax . Surgical clips right axilla. IMPRESSION:  Worsening volume loss and consolidation throughout the left lung. Consider a left endobronchial obstructing process such as an obstructing mucous plug. Electronically Signed   By: Marcello Moores  Register   On: 03/08/2015 07:40    EKG:  Orders placed or performed during the hospital encounter of 03/14/2015  . ED EKG  . ED EKG    ASSESSMENT AND PLAN:  Principal Problem:   NSTEMI (non-ST elevated myocardial infarction) ([place]) Active Problems:   HCAP (healthcare-associated pneumonia)   Abnormal LFTs   ARF (acute renal failure) ([place]) 1. Acute on chronic respiratory failure with hypoxia due to left lower lobe pneumonia, concerning for aspiration pneumonia versus mucus plugging, appreciate pulmonologist input,  received pulmonary PT , continue oxygen therapy as needed nebulizers as well as antibiotics, improving clinically, discussed this pulmonologist, no need for bronchoscopy 2. Left lower lobe pneumonia, continue meropenem and levofloxacin, sputum cultures are pending.  Blood cultures were negative. Continue Humibid,  IV steroids and Pulmicort, better today, oxygenation improved 3. Acute renal failure, resolved with IV fluid administration, unremarkable urinalysis 4. Leukocytosis, resolved 5. Non-Q-wave MI, type 2, due to demand ischemia, per cardiologist, continue aspirin, heparin , nitrates, atenolol, intervention was recommended 6. Elevated transaminases , likely due to statin, improved holding statin. Ultrasound of abdomen revealed a normal common bile duct and unremarkable liver. Following LFTs intermittently  Management plans discussed with the patient, family and they are in agreement.   DRUG ALLERGIES:  Allergies  Allergen Reactions  . Penicillins Rash  . Codeine Other (See Comments)  Unknown reaction.  . Ceftin [Cefuroxime Axetil] Hives    Unknown reaction  . Erythromycin Other (See Comments)    Unknown reaction  . Ibuprofen Other (See Comments)    Unknown reaction  . Sulfa  Antibiotics Other (See Comments)    Unknown reaction    CODE STATUS:     Code Status Orders        Start     Ordered   03/11/2015 1930  Full code   Continuous     03/11/2015 1929    Code Status History    Date Active Date Inactive Code Status Order ID Comments User Context   This patient has a current code status but no historical code status.      TOTAL  TIME TAKING CARE OF THIS PATIENT: 40 minutes.  Discussed this pulmonologist, Dr. Roseanne Reno M.D on 03/09/2015 at 3:17 PM  Between 7am to 6pm - Pager - [phone]  After 6pm go to [email or web address] - password [email or web address] [facility]  Office  [phone]  CC: Primary care physician; Amelia

## 2015-03-09 NOTE — Progress Notes (Signed)
ANTIBIOTIC CONSULT NOTE - FOLLOW UP   Pharmacy Consult for Antibiotic Dosing Day 5 (levofloxacin Day 4/ meropenem Day 2)    Indication: pneumonia  Allergies  Allergen Reactions  . Penicillins Rash  . Codeine Other (See Comments)    Unknown reaction.  . Ceftin [Cefuroxime Axetil] Hives    Unknown reaction  . Erythromycin Other (See Comments)    Unknown reaction  . Ibuprofen Other (See Comments)    Unknown reaction  . Sulfa Antibiotics Other (See Comments)    Unknown reaction    Patient Measurements: Height: 5\' 5"  (165.1 cm) Weight: 152 lb 3.2 oz (69.037 kg) IBW/kg (Calculated) : 57 Adjusted Body Weight: 61 kg   Vital Signs: Temp: 98.5 F (36.9 C) (01/25 0553) Temp Source: Oral (01/25 0553) BP: 154/75 mmHg (01/25 0553) Pulse Rate: 70 (01/25 0553) Intake/Output from previous day: 01/24 0701 - 01/25 0700 In: 1863.8 [P.O.:360; I.V.:1153.8; IV Piggyback:350] Out: 575 [Urine:575] Intake/Output from this shift:    Labs:  Recent Labs  03/07/15 0904 03/08/15 0446 03/08/15 1939 03/09/15 0434  WBC 8.3 7.0  --   --   HGB 12.3 12.3  --   --   PLT 145* 123*  --  131*  CREATININE 0.85 0.77 0.65  --    Estimated Creatinine Clearance: 58.4 mL/min (by C-G formula based on Cr of 0.65). No results for input(s): VANCOTROUGH, VANCOPEAK, VANCORANDOM, GENTTROUGH, GENTPEAK, GENTRANDOM, TOBRATROUGH, TOBRAPEAK, TOBRARND, AMIKACINPEAK, AMIKACINTROU, AMIKACIN in the last 72 hours.   Microbiology: Recent Results (from the past 720 hour(s))  Culture, blood (Routine X 2) w Reflex to ID Panel     Status: None (Preliminary result)   Collection Time: 02/26/2015  1:00 PM  Result Value Ref Range Status   Specimen Description BLOOD LEFT ANTECUBITAL  Final   Special Requests BOTTLES DRAWN AEROBIC AND ANAEROBIC  1CC  Final   Culture NO GROWTH 3 DAYS  Final   Report Status PENDING  Incomplete  Culture, blood (Routine X 2) w Reflex to ID Panel     Status: None (Preliminary result)   Collection  Time: 03/04/2015  1:04 PM  Result Value Ref Range Status   Specimen Description BLOOD LEFT HAND  Final   Special Requests BOTTLES DRAWN AEROBIC AND ANAEROBIC  1CC  Final   Culture NO GROWTH 3 DAYS  Final   Report Status PENDING  Incomplete  MRSA PCR Screening     Status: None   Collection Time: 03/07/15  5:32 PM  Result Value Ref Range Status   MRSA by PCR NEGATIVE NEGATIVE Final    Comment:        The GeneXpert MRSA Assay (FDA approved for NASAL specimens only), is one component of a comprehensive MRSA colonization surveillance program. It is not intended to diagnose MRSA infection nor to guide or monitor treatment for MRSA infections.     Medical History: Past Medical History  Diagnosis Date  . Cancer [facility])     right breast cancer  . S/P radiation therapy     right breast cancer    Medications:  Prescriptions prior to admission  Medication Sig Dispense Refill Last Dose  . acetaminophen (TYLENOL ARTHRITIS PAIN) 650 MG CR tablet Take 1,300 mg by mouth 2 (two) times daily.   unknown  . albuterol (PROVENTIL HFA;VENTOLIN HFA) 108 (90 Base) MCG/ACT inhaler Inhale 2 puffs into the lungs See admin instructions. Inhale 2 puffs every 4 to 6 hours as needed for shortness of breath.   prn  . albuterol (PROVENTIL) (  2.5 MG/3ML) 0.083% nebulizer solution Take 2.5 mg by nebulization 4 (four) times daily as needed for wheezing or shortness of breath (cough).   prn  . aspirin EC 81 MG tablet Take 81 mg by mouth at bedtime.   unknown  . atenolol (TENORMIN) 50 MG tablet Take 50 mg by mouth at bedtime.   unknown  . atorvastatin (LIPITOR) 20 MG tablet Take 20 mg by mouth at bedtime.   unknown  . [EXPIRED] azithromycin (ZITHROMAX) 250 MG tablet Take 250-500 mg by mouth See admin instructions. Take 2 tablets (500mg ) by mouth now, then 1 tablet by mouth every day x 4 days.   unknown  . calcium carbonate (TUMS) 500 MG chewable tablet Chew 1,000 mg by mouth every 2 (two) hours as needed for  indigestion or heartburn. *Maximum 10 tablets a day*   unknown  . Carboxymethylcellul-Glycerin 0.5-0.9 % SOLN Apply 1-2 drops to eye 3 (three) times daily as needed (for dry eye.).   prn  . cetirizine (ZYRTEC) 10 MG tablet Take 10 mg by mouth at bedtime.   unknown  . Cholecalciferol 1000 units capsule Take 1,000 Units by mouth daily.   unknown  . diclofenac sodium (VOLTAREN) 1 % GEL Apply 2-4 g topically See admin instructions. Apply 2 grams to affected area above waist, and 4 grams to affected area below the waist up to 4 times as needed for pain.   unknown  . docusate sodium (COLACE) 100 MG capsule Take 100 mg by mouth daily.   unknown  . DULoxetine (CYMBALTA) 60 MG capsule Take 60 mg by mouth daily.   unknown  . fluticasone (FLOVENT DISKUS) 50 MCG/BLIST diskus inhaler Inhale 2 puffs into the lungs 2 (two) times daily.   unknown  . levothyroxine (SYNTHROID, LEVOTHROID) 175 MCG tablet Take 175 mcg by mouth at bedtime.   unknown  . losartan-hydrochlorothiazide (HYZAAR) 100-25 MG tablet Take 1 tablet by mouth daily.   unknown  . phenytoin (DILANTIN) 100 MG ER capsule Take 300 mg by mouth at bedtime.   unknown  . [EXPIRED] predniSONE (DELTASONE) 20 MG tablet Take 40 mg by mouth daily. X 5 days.   unknown  . ranitidine (ZANTAC) 150 MG capsule Take 150 mg by mouth 2 (two) times daily.   unknown  . senna (SENOKOT) 8.6 MG tablet Take 2 tablets by mouth at bedtime.   unknown  . sodium chloride (NASAL MOIST) 0.65 % nasal spray Place 2 sprays into the nose every 2 (two) hours as needed for congestion.   prn  . traZODone (DESYREL) 50 MG tablet Take 50 mg by mouth at bedtime.   unknown  . Verapamil HCl CR 300 MG CP24 Take 300 mg by mouth daily.   unknown   Assessment: Pharmacy consulted to dose meropenem and levofloxacin for 75 yo female being treated for worsening PNA.     Plan:  Levofloxacin: Will change to Levofloxacin 500 mg PO q24 hours based on IV to PO policy.  Merrem: Will change Merrem dosing  from 500 mg IV q8 hours to Merrem 1 g IV q8 hours.   Pharmacy will continue to monitor and adjust per consult.   Neeta Storey D 03/09/2015,11:31 AM

## 2015-03-09 NOTE — Progress Notes (Signed)
A & O. Up to chair and tolerated it well. 4 L of oxygen. NSR. FS are stable. No pain. IV abx. IVF d/c. Pt has no further concerns at this time.

## 2015-03-09 NOTE — Care Management Important Message (Signed)
Important Message  Patient Details  Name: Julie Anthony MRN: [id number] Date of Birth: Apr 04, 1940   Medicare Important Message Given:  Yes    Juliann Pulse A Jeanann Balinski 03/09/2015, 2:54 PM

## 2015-03-09 NOTE — Progress Notes (Signed)
PHARMACIST - PHYSICIAN COMMUNICATION DR:   [place] CONCERNING: Antibiotic IV to Oral Route Change Policy  RECOMMENDATION: This patient is receiving Levofloxacin by the intravenous route.  Based on criteria approved by the Pharmacy and Therapeutics Committee, the antibiotic(s) is/are being converted to the equivalent oral dose form(s).   DESCRIPTION: These criteria include:  Patient being treated for a respiratory tract infection, urinary tract infection, cellulitis or clostridium difficile associated diarrhea if on metronidazole  The patient is not neutropenic and does not exhibit a GI malabsorption state  The patient is eating (either orally or via tube) and/or has been taking other orally administered medications for a least 24 hours  The patient is improving clinically and has a Tmax < 100.5  If you have questions about this conversion, please contact the Pharmacy Department  []  ( [phone] )  [place] [x]  ( [phone] )  [facility] []  [phone]  [facility] []  ( [phone]  [facility] []  ( [phone] )  [facility]   Burlene Montecalvo D. Yvonnia Tango, PharmD  

## 2015-03-09 NOTE — Consult Note (Signed)
GI Inpatient Follow-up Note  Patient Identification: Julie Anthony is a 75 y.o. female  Subjective: No longer in ICU. Breathing better though still on O2. No CP. LFT remains high but slowly improving. Good appetite and eating ok. Still wants to go home.  Scheduled Inpatient Medications:  . antiseptic oral rinse  7 mL Mouth Rinse q12n4p  . aspirin EC  81 mg Oral Daily  . atenolol  50 mg Oral QHS  . budesonide (PULMICORT) nebulizer solution  0.5 mg Nebulization BID  . chlorhexidine  15 mL Mouth Rinse BID  . cholecalciferol  1,000 Units Oral Daily  . clopidogrel  75 mg Oral Daily  . docusate sodium  100 mg Oral BID  . DULoxetine  60 mg Oral Daily  . famotidine  20 mg Oral BID  . guaiFENesin  600 mg Oral BID  . ipratropium-albuterol  3 mL Nebulization QID  . isosorbide mononitrate  30 mg Oral Daily  . levofloxacin  500 mg Oral Daily  . levothyroxine  175 mcg Oral QHS  . loratadine  10 mg Oral Daily  . meropenem (MERREM) IV  1 g Intravenous 3 times per day  . methylPREDNISolone (SOLU-MEDROL) injection  20 mg Intravenous Q12H  . phenytoin  300 mg Oral QHS  . senna  2 tablet Oral QHS  . sodium chloride  3 mL Intravenous Q12H  . traZODone  50 mg Oral QHS  . verapamil  120 mg Oral Daily   And  . verapamil  180 mg Oral Daily    Continuous Inpatient Infusions:   . sodium chloride 50 mL/hr at 03/09/15 0601    PRN Inpatient Medications:  bisacodyl, calcium carbonate, morphine injection, ondansetron **OR** ondansetron (ZOFRAN) IV  Review of Systems: Constitutional: Weight is stable.  Eyes: No changes in vision. ENT: No oral lesions, sore throat.  GI: see HPI.  Heme/Lymph: No easy bruising.  CV: No chest pain.  GU: No hematuria.  Integumentary: No rashes.  Neuro: No headaches.  Psych: No depression/anxiety.  Endocrine: No heat/cold intolerance.  Allergic/Immunologic: No urticaria.  Resp: No cough, SOB.  Musculoskeletal: No joint swelling.    Physical Examination: BP  121/48 mmHg  Pulse 71  Temp(Src) 98 F (36.7 C) (Oral)  Resp 18  Ht 5\' 5"  (1.651 m)  Wt 69.037 kg (152 lb 3.2 oz)  BMI 25.33 kg/m2  SpO2 97% Gen: NAD, alert and oriented x 4 HEENT: PEERLA, EOMI, Neck: supple, no JVD or thyromegaly Chest: CTA bilaterally, no wheezes, crackles, or other adventitious sounds CV: RRR, no m/g/c/r Abd: soft, NT, ND, +BS in all four quadrants; no HSM, guarding, ridigity, or rebound tenderness Ext: no edema, well perfused with 2+ pulses, Skin: no rash or lesions noted Lymph: no LAD  Data: Lab Results  Component Value Date   WBC 7.0 03/08/2015   HGB 12.3 03/08/2015   HCT 38.4 03/08/2015   MCV 95.9 03/08/2015   PLT 131* 03/09/2015    Recent Labs Lab 03/06/15 0206 03/07/15 0904 03/08/15 0446  HGB 12.6 12.3 12.3   Lab Results  Component Value Date   NA 140 03/08/2015   K 4.0 03/08/2015   CL 101 03/08/2015   CO2 36* 03/08/2015   BUN 27* 03/08/2015   CREATININE 0.65 03/08/2015   Lab Results  Component Value Date   ALT 763* 03/08/2015   AST 272* 03/08/2015   ALKPHOS 101 03/08/2015   BILITOT 0.4 03/08/2015    Recent Labs Lab 02/13/2015 1300  APTT 27  INR 1.46  Assessment/Plan: Julie Anthony is a 75 y.o. female with probable shock liver from ischemia/hypotension. Slowly improving. U/S neg.  Recommendations: Expect LFT to continue to improve. Will sign off. Have pt f/u with [place] in few weeks post discharge to recheck LFT. IF LFT still high, then will order other liver serologies. Thanks. Please call with questions or concerns.  Kateline Kinkade, Lupita Dawn, MD

## 2015-03-10 ENCOUNTER — Inpatient Hospital Stay: Payer: Medicare (Managed Care)

## 2015-03-10 LAB — CULTURE, BLOOD (ROUTINE X 2)
Culture: NO GROWTH
Culture: NO GROWTH

## 2015-03-10 LAB — HEPATIC FUNCTION PANEL
ALK PHOS: 99 U/L (ref 38–126)
ALT: 541 U/L — AB (ref 14–54)
AST: 123 U/L — ABNORMAL HIGH (ref 15–41)
Albumin: 3.2 g/dL — ABNORMAL LOW (ref 3.5–5.0)
BILIRUBIN INDIRECT: 0.2 mg/dL — AB (ref 0.3–0.9)
BILIRUBIN TOTAL: 0.3 mg/dL (ref 0.3–1.2)
Bilirubin, Direct: 0.1 mg/dL (ref 0.1–0.5)
TOTAL PROTEIN: 6 g/dL — AB (ref 6.5–8.1)

## 2015-03-10 LAB — PLATELET COUNT: Platelets: 148 10*3/uL — ABNORMAL LOW (ref 150–440)

## 2015-03-10 LAB — GLUCOSE, CAPILLARY: Glucose-Capillary: 81 mg/dL (ref 65–99)

## 2015-03-10 MED ORDER — IPRATROPIUM-ALBUTEROL 0.5-2.5 (3) MG/3ML IN SOLN
3.0000 mL | RESPIRATORY_TRACT | Status: DC | PRN
  Administered 2015-03-10 – 2015-03-11 (×2): 3 mL via RESPIRATORY_TRACT
  Filled 2015-03-10 (×2): qty 3

## 2015-03-10 MED ORDER — ENSURE ENLIVE PO LIQD
237.0000 mL | Freq: Two times a day (BID) | ORAL | Status: DC
  Administered 2015-03-10: 237 mL via ORAL

## 2015-03-10 MED ORDER — BUDESONIDE 0.5 MG/2ML IN SUSP: 0.5000 mg | Freq: Two times a day (BID) | RESPIRATORY_TRACT | Status: DC | PRN

## 2015-03-10 MED ORDER — LACTULOSE 10 GM/15ML PO SOLN
30.0000 g | Freq: Every day | ORAL | Status: DC
  Administered 2015-03-10 – 2015-03-11 (×2): 30 g via ORAL
  Filled 2015-03-10 (×2): qty 60

## 2015-03-10 MED ORDER — LISINOPRIL 5 MG PO TABS
5.0000 mg | ORAL_TABLET | Freq: Every day | ORAL | Status: DC
  Administered 2015-03-10 – 2015-03-13 (×3): 5 mg via ORAL
  Filled 2015-03-10 (×3): qty 1

## 2015-03-10 NOTE — Progress Notes (Signed)
SUBJECTIVE: No chest pain   Filed Vitals:   03/09/15 2219 03/10/15 0541 03/10/15 0614 03/10/15 0724  BP: 142/80 181/83  139/56  Pulse: 77 58  56  Temp: 98.4 F (36.9 C) 98.2 F (36.8 C)  98.4 F (36.9 C)  TempSrc: Oral Oral  Oral  Resp: 18 18  19   Height:      Weight:   159 lb 9.6 oz (72.394 kg)   SpO2: 97% 95%  94%    Intake/Output Summary (Last 24 hours) at 03/10/15 0751 Last data filed at 03/10/15 0119  Gross per 24 hour  Intake    680 ml  Output    350 ml  Net    330 ml    LABS: Basic Metabolic Panel:  Recent Labs  03/08/15 0446 03/08/15 1939  NA 142 140  K 4.2 4.0  CL 102 101  CO2 35* 36*  GLUCOSE 122* 176*  BUN 39* 27*  CREATININE 0.77 0.65  CALCIUM 8.1* 7.9*   Liver Function Tests:  Recent Labs  03/08/15 0446 03/08/15 1939  AST 464* 272*  ALT 1002* 763*  ALKPHOS 121 101  BILITOT 0.6 0.4  PROT 5.9* 5.5*  ALBUMIN 3.1* 3.0*   No results for input(s): LIPASE, AMYLASE in the last 72 hours. CBC:  Recent Labs  03/07/15 0904 03/08/15 0446 03/09/15 0434  WBC 8.3 7.0  --   HGB 12.3 12.3  --   HCT 37.8 38.4  --   MCV 94.9 95.9  --   PLT 145* 123* 131*   Cardiac Enzymes: No results for input(s): CKTOTAL, CKMB, CKMBINDEX, TROPONINI in the last 72 hours. BNP: Invalid input(s): POCBNP D-Dimer: No results for input(s): DDIMER in the last 72 hours. Hemoglobin A1C: No results for input(s): HGBA1C in the last 72 hours. Fasting Lipid Panel: No results for input(s): CHOL, HDL, LDLCALC, TRIG, CHOLHDL, LDLDIRECT in the last 72 hours. Thyroid Function Tests: No results for input(s): TSH, T4TOTAL, T3FREE, THYROIDAB in the last 72 hours.  Invalid input(s): FREET3 Anemia Panel: No results for input(s): VITAMINB12, FOLATE, FERRITIN, TIBC, IRON, RETICCTPCT in the last 72 hours.  The top PHYSICAL EXAM General: Well developed, well nourished, in no acute distress HEENT:  Normocephalic and atramatic Neck:  No JVD.  Lungs: Clear bilaterally to  auscultation and percussion. Heart: HRRR . Normal S1 and S2 without gallops or murmurs.  Abdomen: Bowel sounds are positive, abdomen soft and non-tender  Msk:  Back normal, normal gait. Normal strength and tone for age. Extremities: No clubbing, cyanosis or edema.   Neuro: Alert and oriented X 3. Psych:  Good affect, responds appropriately  TELEMETRY: Not on monitor  ASSESSMENT AND PLAN: Non-STEMI with renal failure, clinically doing very well. May go home for hospice care.  Principal Problem:   NSTEMI (non-ST elevated myocardial infarction) ([place]) Active Problems:   HCAP (healthcare-associated pneumonia)   Abnormal LFTs   ARF (acute renal failure) ([facility])    Julie Anthony A, MD, [facility] 03/10/2015 7:51 AM

## 2015-03-10 NOTE — Progress Notes (Signed)
PT Cancellation Note  Patient Details Name: Julie Anthony MRN: [id number] DOB: [birth year]   Cancelled Treatment:    Reason Eval/Treat Not Completed: Patient at procedure or test/unavailable (Consult received and chart reviewed.  Patient currently with ST for swallowing evaluation.  Will re-attempt at later time/date as medically appropriate.)   Florabelle Cardin H. Owens Shark, PT, DPT, NCS 03/10/2015, 3:00 PM [phone]

## 2015-03-10 NOTE — Progress Notes (Signed)
Pt removed N/C, found sleeping in chair, O2 sat at 86%. Replaced O2, reviewed breathing exercises, O2 came up to 94%. Patient with mild confusion, continues to remove oxygen sporadically.

## 2015-03-10 NOTE — Care Management (Addendum)
Patient is cared for by [facility] and I will discuss PT recommendation which they will assist with in the home setting [phone]--Hours 8A-5P. RNCM will continue to follow. Social Worker Dia notified of this need via voice mail at [phone] today.

## 2015-03-10 NOTE — Care Management (Signed)
Met with patient who states she is not on home O2. RN states patient's sats drop to 80's on room air. She/Patient wants to go home today. She states she lives alone- no family.

## 2015-03-10 NOTE — Progress Notes (Addendum)
Speech Language Pathology Treatment: Dysphagia  Patient Details Name: Julie Anthony MRN: [id number] DOB: [birth year] Today's Date: 03/10/2015 Time: 1430-1520 SLP Time Calculation (min) (ACUTE ONLY): 50 min  Assessment / Plan / Recommendation Clinical Impression  Pt appeared to adequately and safely toleration trials of thin liquids and purees w/ no overt s/s of aspiration noted during/post po trials. Pt consumed several ozs of thin liquids via straw and bites by tsp given rest breaks b/t trials intermittently to aid digestion. Noted moderate+ belching w/ and after po's. 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 Discussed w/ NSG and pt the need to follow general aspiration precautions AND Reflux precautions to aid digestion. Diet modified to a Pureed diet w/ thin liquids d/t overall presentation and edentulous status. Rec. a GI consult for further assessment of Esophageal motility. MD consulted re: pt's status and presentation. ST will f/u next 1-2 days. NSG/MD agreed.    HPI HPI: Pt is a 75 y.o. female has a past medical history significant for CAD, Dementia, COPD/asthma, OA and breast cancer w/ radiation who presents to ER with persistent cough, congestion, and SOB. Was being treated by her PCP for pneumonia with Septra DS. In ER, pt found to have persistent LLL pneumonia with ARF and abnormal LFT's. Also noted to have troponin=5.5 c/w NSTEMI. She denies CP. Pt is currently on a dys. 3 w/ thin liquids. Pt denies any coughing, choking, or strangling when eating/drinking at meals. Presently, pt has been swallowing pills w/ liquids w/ NSG who deny any overt s/s of aspiration. She can feed herself at meals w/ setup and min. assist. Noted CXR and labs. Pt was transferred to CCU w/ CO2 elevated and was placed on BiPAP. Pt has weaned from BiPAP now and  tolerating [place]. NSG has noted no overt s/s of aspiration w/ po trials at meal/meds but regurgitation has been reported by [place] staff - a moderate amount of liquid and food was regurgitated post lunch meal per report. Pt is on a Dys. 2 diet w/ thin liquids. She is tolerating liquids and meds w/ puree per NSG. Pt denied any coughing or choking but stated the food "just had to come back up" when asked about the regurgitation event. Pt also stated it has been happening at home for 1 month - while denying n/v, or other GI symptoms. Pt exhibited moderate+ belching during session - before and after.       SLP Plan  Continue with current plan of care     Recommendations  Diet recommendations: Dysphagia 1 (puree);Thin liquid (d/t edentulous status and potential GI dysmotility) Liquids provided via: Cup;Straw Medication Administration: Whole meds with liquid (or in puree for easier swallowing) Supervision: Staff to assist with self feeding;Full supervision/cueing for compensatory strategies Compensations: Minimize environmental distractions;Slow rate;Small sips/bites;Follow solids with liquid (rest breaks during po's to allow for digestion) Postural Changes and/or Swallow Maneuvers: Seated upright 90 degrees;Upright 30-60 min after meal             General recommendations:  (GI consult) Oral Care Recommendations: Oral care BID;Staff/trained caregiver to provide oral care Follow up Recommendations:  (TBD) Plan: Continue with current plan of care     [place], [place], CCC-SLP  Makyi Ledo 03/10/2015, 5:03 PM

## 2015-03-10 NOTE — Progress Notes (Signed)
[place] [facility] [place] NAME: Julie Anthony    MR#:  [id number]  DATE OF BIRTH:  10-Feb-1941  SUBJECTIVE:  CHIEF COMPLAINT:   Chief Complaint  Patient presents with  . Dysphagia  . Cough   the patient is a 75 year old Caucasian female with history of dementia, coronary artery disease, COPD, asthma, chronic respiratory failure on 2 L of oxygen through nasal cannula at home, who lives alone presents to the hospital with complaints of cough, chest congestion as well as shortness of breath. In emergency room, she was noted to have elevated troponin to 5.5, but had no chest pain. Patient's chest x-ray was concerning for left basilar infiltrate, repeat a chest x-ray today revealed worsening pneumonia versus atelectasis , concerning for mucous plugging.  Cardiologist does not recommend any interventions, but conservative therapy.   CT of chest on the 23rd of January revealed left upper and left lower lobe pulmonary opacity suspicious for infection or aspiration, pulmonary artery enlargement suggesting pulmonary hypertension. Patient was transferred to intensive care unit yesterday because of worsening hypoxemia, she was initiated on BiPAP, but now she is weaned off. It. Recommended pulmonary PT, percussive therapy/bed. Patient has been coughing and patient's O2 sats improved and now she is on 3 L of oxygen through nasal cannula with saturations of 97%. LFTs had improved since  patient's statin was stopped The patient was evaluated by speech therapist today and felt the patient has been regurgitating,  could have been aspirating regurgitated food. KUB is pending. Oral intake is good at 80% of offered to meals. Chest x-ray still shows worsening of left sided pneumonia Review of Systems  Constitutional: Negative for fever, chills and weight loss.  HENT: Negative for congestion.   Eyes: Negative for blurred vision and double vision.  Respiratory: Positive for  cough. Negative for hemoptysis, sputum production, shortness of breath and wheezing.   Cardiovascular: Negative for chest pain, palpitations, orthopnea, leg swelling and PND.  Gastrointestinal: Negative for nausea, vomiting, abdominal pain, diarrhea, constipation and blood in stool.  Genitourinary: Negative for dysuria, urgency, frequency and hematuria.  Musculoskeletal: Negative for falls.  Neurological: Negative for dizziness, tremors, focal weakness and headaches.  Endo/Heme/Allergies: Does not bruise/bleed easily.  Psychiatric/Behavioral: Negative for depression. The patient does not have insomnia.     VITAL SIGNS: Blood pressure 153/73, pulse 63, temperature 98 F (36.7 C), temperature source Oral, resp. rate 19, height 5\' 5"  (1.651 m), weight 72.394 kg (159 lb 9.6 oz), SpO2 93 %.  PHYSICAL EXAMINATION:   GENERAL:  75 y.o.-year-old patient lying in the bed with no acute distress.  EYES: Pupils equal, round, reactive to light and accommodation. No scleral icterus. Extraocular muscles intact.  HEENT: Head atraumatic, normocephalic. Oropharynx and nasopharynx clear.  NECK:  Supple, no jugular venous distention. No thyroid enlargement, no tenderness.  LUNGS:Diminished breath sounds on the right, mostly at the base, relatively good air entrance on the left ,  Few rhonchi  were noted bilaterally . No use of accessory muscles of respiration. Kyphosis  CARDIOVASCULAR: S1, S2 , bradycardic, regular. No murmurs, rubs, or gallops.  ABDOMEN: Soft, nontender, nondistended. Bowel sounds present. No organomegaly or mass.  EXTREMITIES: No pedal edema, cyanosis, or clubbing.  NEUROLOGIC: Cranial nerves II through XII are intact. Muscle strength 5/5 in all extremities. Sensation intact. Gait not checked.  PSYCHIATRIC: The patient is alert and oriented x 3.  SKIN: No obvious rash, lesion, or ulcer.   ORDERS/RESULTS REVIEWED:   CBC  Recent Labs Lab 03/10/2015 1300 03/06/15 0206 03/07/15 0904  03/08/15 0446 03/09/15 0434 03/10/15 0826  WBC 12.1* 11.9* 8.3 7.0  --   --   HGB 13.7 12.6 12.3 12.3  --   --   HCT 41.6 38.7 37.8 38.4  --   --   PLT 232 211 145* 123* 131* 148*  MCV 93.9 96.7 94.9 95.9  --   --   MCH 31.0 31.6 30.9 30.7  --   --   MCHC 33.0 32.6 32.6 32.0  --   --   RDW 14.7* 15.0* 15.4* 15.3*  --   --   LYMPHSABS 0.8*  --   --   --   --   --   MONOABS 0.9  --   --   --   --   --   EOSABS 0.0  --   --   --   --   --   BASOSABS 0.0  --   --   --   --   --    ------------------------------------------------------------------------------------------------------------------  Chemistries   Recent Labs Lab 03/06/15 0206 03/06/15 2006 03/07/15 0904 03/08/15 0446 03/08/15 1939 03/10/15 0826  NA 140 135 141 142 140  --   K 3.2* 3.4* 4.2 4.2 4.0  --   CL 98* 97* 100* 102 101  --   CO2 31 32 34* 35* 36*  --   GLUCOSE 124* 132* 129* 122* 176*  --   BUN 55* 50* 47* 39* 27*  --   CREATININE 1.70* 1.14* 0.85 0.77 0.65  --   CALCIUM 7.8* 7.4* 8.1* 8.1* 7.9*  --   AST 932* 728*  --  464* 272* 123*  ALT 1148* 1105*  --  1002* 763* 541*  ALKPHOS 114 118  --  121 101 99  BILITOT 1.1 0.6  --  0.6 0.4 0.3   ------------------------------------------------------------------------------------------------------------------ estimated creatinine clearance is 59.7 mL/min (by C-G formula based on Cr of 0.65). ------------------------------------------------------------------------------------------------------------------ No results for input(s): TSH, T4TOTAL, T3FREE, THYROIDAB in the last 72 hours.  Invalid input(s): FREET3  Cardiac Enzymes  Recent Labs Lab 02/25/2015 2030 03/06/15 0206 03/06/15 0743  TROPONINI 3.97* 2.95* 2.46*   ------------------------------------------------------------------------------------------------------------------ Invalid input(s):  POCBNP ---------------------------------------------------------------------------------------------------------------  RADIOLOGY: Dg Chest Port 1 View  03/10/2015  CLINICAL DATA:  Acute onset of shortness of breath. Initial encounter. EXAM: PORTABLE CHEST 1 VIEW COMPARISON:  Chest radiograph performed 03/08/2015 FINDINGS: There is worsening diffuse left-sided airspace opacification, which may reflect worsening pneumonia, though an underlying increased left-sided pleural effusion is a possibility. The right lung is mildly hypoexpanded, with mild vascular crowding. No pneumothorax is seen. Mild leftward mediastinal shift suggests mild left-sided volume loss. No acute osseous abnormalities are identified. Clips are seen overlying the right axilla. IMPRESSION: Worsening diffuse left-sided airspace opacification may reflect worsening pneumonia, though an underlying increased left-sided pleural effusion is a possibility. Right lung mildly hypoexpanded. Electronically Signed   By: Garald Balding M.D.   On: 03/10/2015 06:37    EKG:  Orders placed or performed during the hospital encounter of 03/11/2015  . ED EKG  . ED EKG    ASSESSMENT AND PLAN:  Principal Problem:   NSTEMI (non-ST elevated myocardial infarction) ([place]) Active Problems:   HCAP (healthcare-associated pneumonia)   Abnormal LFTs   ARF (acute renal failure) ([place]) 1. Acute on chronic respiratory failure with hypoxia due to left lower lobe pneumonia, concerning for aspiration pneumonia , appreciate pulmonologist input,  received pulmonary PT , continue oxygen therapy  as needed nebulizers as well as antibiotics, discussed this pulmonologist, no need for bronchoscopy. Patient was noted to be regurgitating back food. Getting KUB, initiating Colace and senna, I suspect patient's regurgitation could be related to her kyphosis. Patient would benefit from barium swallowing or EGD, however, not feasible at this time due to pneumonia 2. Left lower lobe  pneumonia, continue meropenem , discontinue levofloxacin, sputum cultures are pending.  Blood cultures were negative. Continue Humibid,  IV steroids and Pulmicort, stable. Overall . Oxygenation has improved and patient is on 2 L of oxygen through nasal cannula at present 3. Acute renal failure, resolved with IV fluid administration, unremarkable urinalysis, follow with lisinopril initiation 4. Leukocytosis, resolved 5. Non-Q-wave MI, type 2, due to demand ischemia, per cardiologist, continue aspirin, heparin , nitrates, atenolol, no intervention was recommended 6. Elevated transaminases , likely due to statin, improved holding statin. Ultrasound of abdomen revealed a normal common bile duct and unremarkable liver. Following LFTs intermittently 7. Regurgitation, likely related to kyphosis, slow gastrointestinal passage, getting KUB and continue patient on Colace and Senokot, also dysphagia 2 diet, discussed with speech therapist  Management plans discussed with the patient, family and they are in agreement.   DRUG ALLERGIES:  Allergies  Allergen Reactions  . Penicillins Rash  . Codeine Other (See Comments)    Unknown reaction.  . Ceftin [Cefuroxime Axetil] Hives    Unknown reaction  . Erythromycin Other (See Comments)    Unknown reaction  . Ibuprofen Other (See Comments)    Unknown reaction  . Sulfa Antibiotics Other (See Comments)    Unknown reaction    CODE STATUS:     Code Status Orders        Start     Ordered   03/04/2015 1930  Full code   Continuous     02/22/2015 1929    Code Status History    Date Active Date Inactive Code Status Order ID Comments User Context   This patient has a current code status but no historical code status.      TOTAL  TIME TAKING CARE OF THIS PATIENT: 40 minutes.  Discussed with speech therapist  Ezzie Senat M.D on 03/10/2015 at 4:23 PM  Between 7am to 6pm - Pager - [phone]  After 6pm go to [email or web address] - password [email or web address] [facility]  Office  [phone]  CC: Primary care physician; [place]

## 2015-03-11 ENCOUNTER — Inpatient Hospital Stay: Payer: Medicare (Managed Care)

## 2015-03-11 DIAGNOSIS — J9602 Acute respiratory failure with hypercapnia: Secondary | ICD-10-CM

## 2015-03-11 LAB — BLOOD GAS, ARTERIAL
ACID-BASE EXCESS: 14.9 mmol/L — AB (ref 0.0–3.0)
BICARBONATE: 44.8 meq/L — AB (ref 21.0–28.0)
FIO2: 24
O2 SAT: 84.5 %
PATIENT TEMPERATURE: 37
PO2 ART: 53 mmHg — AB (ref 83.0–108.0)
pCO2 arterial: 85 mmHg (ref 32.0–48.0)
pH, Arterial: 7.33 — ABNORMAL LOW (ref 7.350–7.450)

## 2015-03-11 LAB — GLUCOSE, CAPILLARY: Glucose-Capillary: 116 mg/dL — ABNORMAL HIGH (ref 65–99)

## 2015-03-11 LAB — MRSA PCR SCREENING: MRSA by PCR: NEGATIVE

## 2015-03-11 LAB — CREATININE, SERUM
CREATININE: 0.55 mg/dL (ref 0.44–1.00)
GFR calc non Af Amer: >60 mL/min (ref >60)

## 2015-03-11 MED ORDER — BUDESONIDE 0.5 MG/2ML IN SUSP
0.5000 mg | Freq: Two times a day (BID) | RESPIRATORY_TRACT | Status: DC
  Administered 2015-03-11 – 2015-03-24 (×26): 0.5 mg via RESPIRATORY_TRACT
  Filled 2015-03-11 (×28): qty 2

## 2015-03-11 MED ORDER — SODIUM CHLORIDE 0.9 % IV BOLUS (SEPSIS)
250.0000 mL | Freq: Once | INTRAVENOUS | Status: AC
  Administered 2015-03-11: 250 mL via INTRAVENOUS

## 2015-03-11 MED ORDER — SODIUM CHLORIDE 0.9 % IV BOLUS (SEPSIS)
750.0000 mL | INTRAVENOUS | Status: AC
  Administered 2015-03-11: 750 mL via INTRAVENOUS

## 2015-03-11 MED ORDER — IPRATROPIUM-ALBUTEROL 0.5-2.5 (3) MG/3ML IN SOLN
3.0000 mL | RESPIRATORY_TRACT | Status: DC
  Administered 2015-03-11 – 2015-03-21 (×59): 3 mL via RESPIRATORY_TRACT
  Filled 2015-03-11 (×58): qty 3

## 2015-03-11 MED ORDER — METOCLOPRAMIDE HCL 10 MG PO TABS
5.0000 mg | ORAL_TABLET | Freq: Three times a day (TID) | ORAL | Status: DC
  Administered 2015-03-11 – 2015-03-18 (×14): 5 mg via ORAL
  Filled 2015-03-11 (×10): qty 1
  Filled 2015-03-11: qty 2
  Filled 2015-03-11 (×4): qty 1

## 2015-03-11 MED ORDER — SODIUM CHLORIDE 0.9 % IV SOLN
INTRAVENOUS | Status: DC
  Administered 2015-03-12 – 2015-03-15 (×5): via INTRAVENOUS

## 2015-03-11 MED ORDER — HALOPERIDOL LACTATE 5 MG/ML IJ SOLN
2.0000 mg | Freq: Once | INTRAMUSCULAR | Status: AC
  Administered 2015-03-11: 2 mg via INTRAVENOUS
  Filled 2015-03-11: qty 1

## 2015-03-11 NOTE — Progress Notes (Signed)
Patient transferred from 1A to CCU. Clinical Education officer, museum ([place]) received a call from [employer] who reported that the plan is for patient to go to [facility] after hospitalization. [facility] social worker requested CSW to complete PASARR and FL2. Patient had an existing PASARR that expired in 2009. PASARR went to level 2 and was denied. On 2009 PASARR screen mild MR was reported. MR is not on the H&P and [facility] MD has no knowledge of history of MR. CSW contacted [facility] and made them aware. [place] [facility] requested H&P and FL2. CSW faxed requested clinicals to [place] [facility]. Per [place] [facility] level 2 screen will be initiated this time as well.   FL2 complete PASARR pending (level 2). CSW will continue to follow and assist as needed.   Blima Rich, LCSW [phone]

## 2015-03-11 NOTE — Progress Notes (Signed)
BG 125 this AM, did not transfer to Epic via glucometer

## 2015-03-11 NOTE — Progress Notes (Signed)
Initial Nutrition Assessment   INTERVENTION:   Coordination of Care: await diet progression as medically able, SLP following previously on Dysphagia I diet order Medical Food Supplement Therapy: agree with Ensure as ordered when pt can safely tolerate   NUTRITION DIAGNOSIS:   Inadequate oral intake related to inability to eat as evidenced by NPO status.  GOAL:   Patient will meet greater than or equal to 90% of their needs  MONITOR:    (Energy Intake, Anthropometrics, Pulmonary Profile, Digestive System)  REASON FOR ASSESSMENT:   LOS    ASSESSMENT:   Pt admitted with NSTEMI, recently transferred to ICU on bipap for respiratory acidosis.  Past Medical History  Diagnosis Date  . Cancer [facility])     right breast cancer  . S/P radiation therapy     right breast cancer    Diet Order:      Current Nutrition: Pt did not have lunch today, unsure about breakfast. Pt resting on bipap on visit.   Food/Nutrition-Related History: Recorded po intake since admission >75% of meals. Per MST no decrease in appetite PTA. Ensure order placed. Per SLP pt with poor dentition and was tolerating dysphagia I previously.   Scheduled Medications:  . antiseptic oral rinse  7 mL Mouth Rinse q12n4p  . aspirin EC  81 mg Oral Daily  . atenolol  50 mg Oral QHS  . budesonide (PULMICORT) nebulizer solution  0.5 mg Nebulization BID  . chlorhexidine  15 mL Mouth Rinse BID  . cholecalciferol  1,000 Units Oral Daily  . clopidogrel  75 mg Oral Daily  . docusate sodium  100 mg Oral BID  . DULoxetine  60 mg Oral Daily  . famotidine  20 mg Oral BID  . feeding supplement (ENSURE ENLIVE)  237 mL Oral BID BM  . guaiFENesin  600 mg Oral BID  . ipratropium-albuterol  3 mL Nebulization Q4H  . isosorbide mononitrate  30 mg Oral Daily  . lactulose  30 g Oral Daily  . levothyroxine  175 mcg Oral QHS  . lisinopril  5 mg Oral Daily  . loratadine  10 mg Oral Daily  . meropenem (MERREM) IV  1 g Intravenous 3  times per day  . methylPREDNISolone (SOLU-MEDROL) injection  20 mg Intravenous Q12H  . metoCLOPramide  5 mg Oral TID AC & HS  . phenytoin  300 mg Oral QHS  . senna  2 tablet Oral QHS  . sodium chloride  3 mL Intravenous Q12H  . traZODone  50 mg Oral QHS  . verapamil  120 mg Oral Daily   And  . verapamil  180 mg Oral Daily     Electrolyte/Renal Profile and Glucose Profile:   Recent Labs Lab 03/07/15 0904 03/08/15 0446 03/08/15 1939 03/11/15 0436  NA 141 142 140  --   K 4.2 4.2 4.0  --   CL 100* 102 101  --   CO2 34* 35* 36*  --   BUN 47* 39* 27*  --   CREATININE 0.85 0.77 0.65 0.55  CALCIUM 8.1* 8.1* 7.9*  --   GLUCOSE 129* 122* 176*  --    Protein Profile:   Recent Labs Lab 03/08/15 0446 03/08/15 1939 03/10/15 0826  ALBUMIN 3.1* 3.0* 3.2*    Gastrointestinal Profile: Last BM:  03/07/2015   Nutrition-Focused Physical Exam Findings:  Unable to complete Nutrition-Focused physical exam at this time.     Weight Change:  Filed Weights   03/09/15 0553 03/10/15 0614 03/11/15 0500  Weight:  152 lb 3.2 oz (69.037 kg) 159 lb 9.6 oz (72.394 kg) 166 lb 5.5 oz (75.454 kg)     Height:   Ht Readings from Last 1 Encounters:  03/02/2015 5\' 5"  (1.651 m)    Weight:   Wt Readings from Last 1 Encounters:  03/11/15 166 lb 5.5 oz (75.454 kg)     BMI:  Body mass index is 27.68 kg/(m^2).  Estimated Nutritional Needs:   Kcal:  BEE: 1245kcals, TEE: (IF 1.1-1.3)(AF 1.2) 1646-1945kcals  Protein:  83-98g protein (1.1-1.3g/kg)  Fluid:  1888-22105mL of fluid (25-56mL/kg)  EDUCATION NEEDS:   Education needs no appropriate at this time  [place], RD, LDN Pager [phone] Weekend/On-Call Pager [phone]

## 2015-03-11 NOTE — Progress Notes (Signed)
PT Cancellation Note  Patient Details Name: Julie Anthony MRN: [id number] DOB: 06-25-1940   Cancelled Treatment:    Reason Eval/Treat Not Completed: Medical issues which prohibited therapy (Chart reviewed for attempted re-evaluation.  Patient noted with transfer to CCU due to decline in respiratory status.  Will require new/continue orders to resume PT services.  Please address as medically appropriate.)   Kishon Garriga H. Owens Shark, PT, DPT, NCS 03/11/2015, 12:20 PM [phone]

## 2015-03-11 NOTE — Progress Notes (Signed)
ANTIBIOTIC CONSULT NOTE - FOLLOW UP   Pharmacy Consult for Antibiotic Dosing Day 7 (meropenem Day 4)    Indication: pneumonia  Allergies  Allergen Reactions  . Penicillins Rash  . Codeine Other (See Comments)    Unknown reaction.  . Ceftin [Cefuroxime Axetil] Hives    Unknown reaction  . Erythromycin Other (See Comments)    Unknown reaction  . Ibuprofen Other (See Comments)    Unknown reaction  . Sulfa Antibiotics Other (See Comments)    Unknown reaction    Patient Measurements: Height: 5\' 5"  (165.1 cm) Weight: 166 lb 5.5 oz (75.454 kg) IBW/kg (Calculated) : 57 Adjusted Body Weight: 61 kg   Vital Signs: Temp: 98.2 F (36.8 C) (01/27 0735) Temp Source: Oral (01/27 0735) BP: 125/59 mmHg (01/27 0735) Pulse Rate: 68 (01/27 0735) Intake/Output from previous day: 01/26 0701 - 01/27 0700 In: 120 [P.O.:120] Out: 335 [Urine:335] Intake/Output from this shift: Total I/O In: -  Out: 100 [Urine:100]  Labs:  Recent Labs  03/08/15 1939 03/09/15 0434 03/10/15 0826 03/11/15 0436  PLT  --  131* 148*  --   CREATININE 0.65  --   --  0.55   Estimated Creatinine Clearance: 60.8 mL/min (by C-G formula based on Cr of 0.55). No results for input(s): VANCOTROUGH, VANCOPEAK, VANCORANDOM, GENTTROUGH, GENTPEAK, GENTRANDOM, TOBRATROUGH, TOBRAPEAK, TOBRARND, AMIKACINPEAK, AMIKACINTROU, AMIKACIN in the last 72 hours.   Microbiology: Recent Results (from the past 720 hour(s))  Culture, blood (Routine X 2) w Reflex to ID Panel     Status: None   Collection Time: 03/12/2015  1:00 PM  Result Value Ref Range Status   Specimen Description BLOOD LEFT ANTECUBITAL  Final   Special Requests BOTTLES DRAWN AEROBIC AND ANAEROBIC  1CC  Final   Culture NO GROWTH 5 DAYS  Final   Report Status 03/10/2015 FINAL  Final  Culture, blood (Routine X 2) w Reflex to ID Panel     Status: None   Collection Time: 02/25/2015  1:04 PM  Result Value Ref Range Status   Specimen Description BLOOD LEFT HAND  Final    Special Requests BOTTLES DRAWN AEROBIC AND ANAEROBIC  1CC  Final   Culture NO GROWTH 5 DAYS  Final   Report Status 03/10/2015 FINAL  Final  MRSA PCR Screening     Status: None   Collection Time: 03/07/15  5:32 PM  Result Value Ref Range Status   MRSA by PCR NEGATIVE NEGATIVE Final    Comment:        The GeneXpert MRSA Assay (FDA approved for NASAL specimens only), is one component of a comprehensive MRSA colonization surveillance program. It is not intended to diagnose MRSA infection nor to guide or monitor treatment for MRSA infections.     Medical History: Past Medical History  Diagnosis Date  . Cancer [facility])     right breast cancer  . S/P radiation therapy     right breast cancer    Medications:  Prescriptions prior to admission  Medication Sig Dispense Refill Last Dose  . acetaminophen (TYLENOL ARTHRITIS PAIN) 650 MG CR tablet Take 1,300 mg by mouth 2 (two) times daily.   unknown  . albuterol (PROVENTIL HFA;VENTOLIN HFA) 108 (90 Base) MCG/ACT inhaler Inhale 2 puffs into the lungs See admin instructions. Inhale 2 puffs every 4 to 6 hours as needed for shortness of breath.   prn  . albuterol (PROVENTIL) (2.5 MG/3ML) 0.083% nebulizer solution Take 2.5 mg by nebulization 4 (four) times daily as needed for wheezing  or shortness of breath (cough).   prn  . aspirin EC 81 MG tablet Take 81 mg by mouth at bedtime.   unknown  . atenolol (TENORMIN) 50 MG tablet Take 50 mg by mouth at bedtime.   unknown  . atorvastatin (LIPITOR) 20 MG tablet Take 20 mg by mouth at bedtime.   unknown  . [EXPIRED] azithromycin (ZITHROMAX) 250 MG tablet Take 250-500 mg by mouth See admin instructions. Take 2 tablets (500mg ) by mouth now, then 1 tablet by mouth every day x 4 days.   unknown  . calcium carbonate (TUMS) 500 MG chewable tablet Chew 1,000 mg by mouth every 2 (two) hours as needed for indigestion or heartburn. *Maximum 10 tablets a day*   unknown  . Carboxymethylcellul-Glycerin 0.5-0.9 % SOLN  Apply 1-2 drops to eye 3 (three) times daily as needed (for dry eye.).   prn  . cetirizine (ZYRTEC) 10 MG tablet Take 10 mg by mouth at bedtime.   unknown  . Cholecalciferol 1000 units capsule Take 1,000 Units by mouth daily.   unknown  . diclofenac sodium (VOLTAREN) 1 % GEL Apply 2-4 g topically See admin instructions. Apply 2 grams to affected area above waist, and 4 grams to affected area below the waist up to 4 times as needed for pain.   unknown  . docusate sodium (COLACE) 100 MG capsule Take 100 mg by mouth daily.   unknown  . DULoxetine (CYMBALTA) 60 MG capsule Take 60 mg by mouth daily.   unknown  . fluticasone (FLOVENT DISKUS) 50 MCG/BLIST diskus inhaler Inhale 2 puffs into the lungs 2 (two) times daily.   unknown  . levothyroxine (SYNTHROID, LEVOTHROID) 175 MCG tablet Take 175 mcg by mouth at bedtime.   unknown  . losartan-hydrochlorothiazide (HYZAAR) 100-25 MG tablet Take 1 tablet by mouth daily.   unknown  . phenytoin (DILANTIN) 100 MG ER capsule Take 300 mg by mouth at bedtime.   unknown  . [EXPIRED] predniSONE (DELTASONE) 20 MG tablet Take 40 mg by mouth daily. X 5 days.   unknown  . ranitidine (ZANTAC) 150 MG capsule Take 150 mg by mouth 2 (two) times daily.   unknown  . senna (SENOKOT) 8.6 MG tablet Take 2 tablets by mouth at bedtime.   unknown  . sodium chloride (NASAL MOIST) 0.65 % nasal spray Place 2 sprays into the nose every 2 (two) hours as needed for congestion.   prn  . traZODone (DESYREL) 50 MG tablet Take 50 mg by mouth at bedtime.   unknown  . Verapamil HCl CR 300 MG CP24 Take 300 mg by mouth daily.   unknown   Assessment: Pharmacy consulted to dose meropenem and levofloxacin for 75 yo female being treated for PNA. Patient previously on Levofloxacin as well; however discontinued by MD [place]     Plan:  Will continue Merrem 1 g IV q8 hours.   Pharmacy will continue to monitor and adjust per consult.   Belvia Gotschall D 03/11/2015,9:08 AM

## 2015-03-11 NOTE — Care Management Important Message (Signed)
Important Message  Patient Details  Name: Julie Anthony MRN: [id number] Date of Birth: 11-Mar-1940   Medicare Important Message Given:  Yes    Marshell Garfinkel, RN 03/11/2015, 9:28 AM

## 2015-03-11 NOTE — Care Management (Signed)
Julie Anthony with [facility]- neighbors looking after her dog. O2 was arrange prior to this admission but it was only PRN and she "was not using it right". [facility] will be meeting today to meet her needs. Patient wants to go home. [facility] will follow up with [facility] today with a plan.

## 2015-03-11 NOTE — NC FL2 (Signed)
[place] LEVEL OF CARE SCREENING TOOL     IDENTIFICATION  Patient Name: Julie Anthony Birthdate: [birth year] Sex: female Admission Date (Current Location): 03/03/2015  [place] and [place] Number:  Engineering geologist and Address:  [facility], [address] [place], [place] [address]      Provider Number: [id number]  Attending Physician Name and Address:  Theodoro Grist, MD  Relative Name and Phone Number:       Current Level of Care: Hospital Recommended Level of Care: [place] Prior Approval Number:    Date Approved/Denied:   PASRR Number:    Discharge Plan: SNF    Current Diagnoses: Patient Active Problem List   Diagnosis Date Noted  . NSTEMI (non-ST elevated myocardial infarction) ([place]) 03/11/2015  . HCAP (healthcare-associated pneumonia) 02/23/2015  . Abnormal LFTs 03/08/2015  . ARF (acute renal failure) ([place]) 03/04/2015    Orientation RESPIRATION BLADDER Height & Weight    Self, Time, Situation, Place  Other (Comment) (Bi-pap) Continent 5\' 5"  (165.1 cm) 166 lbs.  BEHAVIORAL SYMPTOMS/MOOD NEUROLOGICAL BOWEL NUTRITION STATUS   (none ) Convulsions/Seizures (History of Seizures ) Continent Diet (SLP to evaluate)  AMBULATORY STATUS COMMUNICATION OF NEEDS Skin   Extensive Assist Verbally Normal                       Personal Care Assistance Level of Assistance  Bathing, Feeding, Dressing Bathing Assistance: Limited assistance Feeding assistance: Limited assistance Dressing Assistance: Limited assistance     Functional Limitations Info  Sight, Hearing, Speech Sight Info: Adequate Hearing Info: Impaired Speech Info: Adequate    SPECIAL CARE FACTORS FREQUENCY  PT (By licensed PT)     PT Frequency:  (5)              Contractures      Additional Factors Info  Code Status, Allergies Code Status Info:  (Full Code. ) Allergies Info:  (Penicillins, Codeine, Ceftin Cefuroxime Axetil,  Erythromycin, Ibuprofen, Sulfa Antibiotics)           Current Medications (03/11/2015):  This is the current hospital active medication list Current Facility-Administered Medications  Medication Dose Route Frequency Provider Last Rate Last Dose  . antiseptic oral rinse (CPC / CETYLPYRIDINIUM CHLORIDE 0.05%) solution 7 mL  7 mL Mouth Rinse q12n4p Theodoro Grist, MD   7 mL at 03/10/15 1600  . aspirin EC tablet 81 mg  81 mg Oral Daily Idelle Crouch, MD   81 mg at 03/10/15 0955  . atenolol (TENORMIN) tablet 50 mg  50 mg Oral QHS Idelle Crouch, MD   50 mg at 03/10/15 2138  . bisacodyl (DULCOLAX) suppository 10 mg  10 mg Rectal Daily PRN Idelle Crouch, MD      . budesonide (PULMICORT) nebulizer solution 0.5 mg  0.5 mg Nebulization BID PRN Theodoro Grist, MD      . budesonide (PULMICORT) nebulizer solution 0.5 mg  0.5 mg Nebulization BID Flora Lipps, MD      . calcium carbonate (TUMS - dosed in mg elemental calcium) chewable tablet 1,000 mg  1,000 mg Oral Q2H PRN Idelle Crouch, MD   1,000 mg at 03/06/15 2143  . chlorhexidine (PERIDEX) 0.12 % solution 15 mL  15 mL Mouth Rinse BID Theodoro Grist, MD   15 mL at 03/11/15 0802  . cholecalciferol (VITAMIN D) tablet 1,000 Units  1,000 Units Oral Daily Idelle Crouch, MD   1,000 Units at 03/10/15 0957  .  clopidogrel (PLAVIX) tablet 75 mg  75 mg Oral Daily Dionisio David, MD   75 mg at 03/10/15 0956  . docusate sodium (COLACE) capsule 100 mg  100 mg Oral BID Idelle Crouch, MD   100 mg at 03/10/15 2138  . DULoxetine (CYMBALTA) DR capsule 60 mg  60 mg Oral Daily Idelle Crouch, MD   60 mg at 03/10/15 0957  . famotidine (PEPCID) tablet 20 mg  20 mg Oral BID Idelle Crouch, MD   20 mg at 03/10/15 2139  . feeding supplement (ENSURE ENLIVE) (ENSURE ENLIVE) liquid 237 mL  237 mL Oral BID BM Theodoro Grist, MD   237 mL at 03/10/15 1500  . guaiFENesin (MUCINEX) 12 hr tablet 600 mg  600 mg Oral BID Theodoro Grist, MD   600 mg at 03/10/15 2138  .  ipratropium-albuterol (DUONEB) 0.5-2.5 (3) MG/3ML nebulizer solution 3 mL  3 mL Nebulization Q4H Flora Lipps, MD      . isosorbide mononitrate (IMDUR) 24 hr tablet 30 mg  30 mg Oral Daily Dionisio David, MD   30 mg at 03/10/15 0957  . lactulose (CHRONULAC) 10 GM/15ML solution 30 g  30 g Oral Daily Theodoro Grist, MD   30 g at 03/11/15 0802  . levothyroxine (SYNTHROID, LEVOTHROID) tablet 175 mcg  175 mcg Oral QHS Idelle Crouch, MD   175 mcg at 03/10/15 2138  . lisinopril (PRINIVIL,ZESTRIL) tablet 5 mg  5 mg Oral Daily Theodoro Grist, MD   5 mg at 03/10/15 1818  . loratadine (CLARITIN) tablet 10 mg  10 mg Oral Daily Idelle Crouch, MD   10 mg at 03/10/15 0957  . meropenem (MERREM) 1 g in sodium chloride 0.9 % 100 mL IVPB  1 g Intravenous 3 times per day Vilinda Boehringer, MD   1 g at 03/11/15 0515  . methylPREDNISolone sodium succinate (SOLU-MEDROL) 40 mg/mL injection 20 mg  20 mg Intravenous Q12H Flora Lipps, MD   20 mg at 03/11/15 0055  . metoCLOPramide (REGLAN) tablet 5 mg  5 mg Oral TID AC & HS Theodoro Grist, MD   5 mg at 03/11/15 0801  . morphine 2 MG/ML injection 2 mg  2 mg Intravenous Q2H PRN Idelle Crouch, MD   2 mg at 03/06/15 0012  . ondansetron (ZOFRAN) tablet 4 mg  4 mg Oral Q6H PRN Idelle Crouch, MD   4 mg at 03/06/15 1949   Or  . ondansetron (ZOFRAN) injection 4 mg  4 mg Intravenous Q6H PRN Idelle Crouch, MD      . phenytoin (DILANTIN) ER capsule 300 mg  300 mg Oral QHS Idelle Crouch, MD   300 mg at 03/10/15 2138  . senna (SENOKOT) tablet 17.2 mg  2 tablet Oral QHS Idelle Crouch, MD   17.2 mg at 03/10/15 2140  . sodium chloride 0.9 % injection 3 mL  3 mL Intravenous Q12H Idelle Crouch, MD   3 mL at 03/10/15 2139  . traZODone (DESYREL) tablet 50 mg  50 mg Oral QHS Idelle Crouch, MD   50 mg at 03/10/15 2139  . verapamil (CALAN-SR) CR tablet 120 mg  120 mg Oral Daily Idelle Crouch, MD   120 mg at 03/10/15 0956   And  . verapamil (CALAN-SR) CR tablet 180 mg  180 mg  Oral Daily Idelle Crouch, MD   180 mg at 03/10/15 [id number]     Discharge Medications: Please see discharge summary for  a list of discharge medications.  Relevant Imaging Results:  Relevant Lab Results:   Additional Information  (SSN: [id number])  Loralyn Freshwater, LCSW

## 2015-03-11 NOTE — Progress Notes (Signed)
PULMONARY / CRITICAL CARE MEDICINE   Name: Julie Anthony MRN: [id number] DOB: 08/07/1940    ADMISSION DATE:  02/21/2015   CC follow up resp failure  HPI re-admitted to ICU for increased WOB, unresponsive On bIPAP, high risk for intubation  STUDIES:  1/23 CT Chest - Left upper and left lower lobe pulmonary opacities, suspicious for infection or aspiration. Fluid in the left endobronchial tree could relate to mucous plugging or aspiration. Enlarged PA.  SIGNIFICANT EVENTS: 1/24 - lethargy, severe resp acidosis, transferred to ICU placed on [place]: Temp:  [98 F (36.7 C)-98.4 F (36.9 C)] 98.2 F (36.8 C) (01/27 0735) Pulse Rate:  [55-71] 55 (01/27 1119) Resp:  [15-19] 18 (01/27 1119) BP: (107-153)/(50-73) 125/65 mmHg (01/27 1119) SpO2:  [90 %-97 %] 94 % (01/27 1119) FiO2 (%):  [50 %] 50 % (01/27 1119) Weight:  [166 lb 5.5 oz (75.454 kg)] 166 lb 5.5 oz (75.454 kg) (01/27 0500) HEMODYNAMICS:   VENTILATOR SETTINGS: Vent Mode:  [-]  FiO2 (%):  [50 %] 50 % INTAKE / OUTPUT:  Intake/Output Summary (Last 24 hours) at 03/11/15 1303 Last data filed at 03/11/15 1046  Gross per 24 hour  Intake    400 ml  Output    375 ml  Net     25 ml    Review of Systems  Unable to perform ROS: critical illness  Respiratory: Positive for shortness of breath and wheezing.     Physical Exam  Constitutional: She appears distressed.  HENT:  Head: Normocephalic and atraumatic.  Eyes: Pupils are equal, round, and reactive to light.  Neck: Normal range of motion. Neck supple.  Cardiovascular: Normal rate, regular rhythm and normal heart sounds.   No murmur heard. Pulmonary/Chest: She is in respiratory distress. She has wheezes. She has rales.  Abdominal: Soft. Bowel sounds are normal.  Musculoskeletal: Normal range of motion.  Neurological:  lethargic  Skin: Skin is warm. She is diaphoretic.  Nursing note and vitals reviewed.    LABS:  CBC  Recent Labs Lab  03/06/15 0206 03/07/15 0904 03/08/15 0446 03/09/15 0434 03/10/15 0826  WBC 11.9* 8.3 7.0  --   --   HGB 12.6 12.3 12.3  --   --   HCT 38.7 37.8 38.4  --   --   PLT 211 145* 123* 131* 148*   Coag's  Recent Labs Lab 02/26/2015 1300  APTT 27  INR 1.46   BMET  Recent Labs Lab 03/07/15 0904 03/08/15 0446 03/08/15 1939 03/11/15 0436  NA 141 142 140  --   K 4.2 4.2 4.0  --   CL 100* 102 101  --   CO2 34* 35* 36*  --   BUN 47* 39* 27*  --   CREATININE 0.85 0.77 0.65 0.55  GLUCOSE 129* 122* 176*  --    Electrolytes  Recent Labs Lab 03/07/15 0904 03/08/15 0446 03/08/15 1939  CALCIUM 8.1* 8.1* 7.9*   Sepsis Markers No results for input(s): LATICACIDVEN, PROCALCITON, O2SATVEN in the last 168 hours. ABG  Recent Labs Lab 03/07/15 1636 03/08/15 1245 03/11/15 1003  PHART 7.29* 7.36 7.33*  PCO2ART 82* 68* 85*  PO2ART 68* 95 53*   Liver Enzymes  Recent Labs Lab 03/08/15 0446 03/08/15 1939 03/10/15 0826  AST 464* 272* 123*  ALT 1002* 763* 541*  ALKPHOS 121 101 99  BILITOT 0.6 0.4 0.3  ALBUMIN 3.1* 3.0* 3.2*   Cardiac Enzymes  Recent Labs Lab 03/14/2015 2030 03/06/15 0206 03/06/15  [id number]  TROPONINI 3.97* 2.95* 2.46*   Glucose  Recent Labs Lab 03/07/15 0745 03/08/15 0848 03/09/15 0805 03/09/15 1204 03/10/15 0720 03/11/15 1117  GLUCAP 132* 118* 104* 146* 81 116*    Imaging Dg Abd 1 View  03/10/2015  CLINICAL DATA:  75 year old female with sudden onset regurgitation. Cough, congestion, shortness of breath. Initial encounter. EXAM: ABDOMEN - 1 VIEW COMPARISON:  Chest CT 03/07/2015.  Pelvis radiograph 05/20/2006. FINDINGS: Supine view of the abdomen at 1631 hours. Advanced thoracolumbar scoliosis and degenerative changes. Chronic appearing left inferior pubic ramus fracture. No definite acute osseous abnormality identified. Non obstructed bowel gas pattern. Abdominal and pelvic visceral contours are within normal limits. No definite pneumoperitoneum on  this supine view. Calcified aortic atherosclerosis. Pelvic phleboliths. Calcified femoral artery atherosclerosis. Probable cholecystectomy clips in the right upper quadrant. IMPRESSION: Non obstructed bowel gas pattern. Electronically Signed   By: Genevie Ann M.D.   On: 03/10/2015 16:49   Dg Chest Port 1 View  03/11/2015  CLINICAL DATA:  Follow-up pneumonia.  Subsequent encounter. EXAM: PORTABLE CHEST 1 VIEW COMPARISON:  Chest radiograph performed 03/10/2015 FINDINGS: Worsening pneumonia is noted, with new right basilar airspace opacity and persistent complete opacification of the left hemithorax. An underlying small left pleural effusion cannot be excluded. No pneumothorax is seen. The cardiomediastinal silhouette is not well assessed due to complete opacification of the left hemithorax. No acute osseous abnormalities are seen. Clips are noted overlying the right axilla. IMPRESSION: Worsening pneumonia, with new right basilar airspace opacity and persistent complete opacification of the left hemithorax. Cannot exclude underlying small left pleural effusion. Electronically Signed   By: Garald Balding M.D.   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  Noted to have LLL opacity, worsening respiratory distress on biPAP  1.AECOPD-biPAP as needed/tolerated - cont with steroids and nebs - check ABG as needed - f/u sputum cultures   2.LLL opacity c/w pneemonia with mucus pluggs - CT chest review  -continue abx   3.Multifocal Pneumonia - plan as stated above - antibiotics as stated above  4.NSTEMI - CE if needed - cont with asa, heparin, nitrates, BP control   The Patient requires high complexity decision making for assessment and  support, frequent evaluation and titration of therapies, application of advanced monitoring technologies and extensive interpretation of multiple databases. Critical Care Time devoted to patient care services described in this note is 45 minutes.   Overall, patient is critically ill, prognosis is guarded.  At high risk for cardiac arrest and death.  Palliative care team consulted-   Laakea Pereira Patricia Pesa, M.D.  [facility] Pulmonary & Critical Care Medicine  Medical Director [place] Director [facility] Cardio-Pulmonary Department

## 2015-03-11 NOTE — Progress Notes (Signed)
Speech Therapy Note: reviewed chart notes indicating pt was transferred to CCU d/t a decline in respiratory status. Pt is now on BiPAP for O2 support and is NPO. ST will f/u w/ pt's status next 1-3 days.

## 2015-03-11 NOTE — Progress Notes (Signed)
Pt ABG's abnornal this AM, resp called w/ results, paged MD. Per Dr. Ether Griffins, new orders for transfer to ICU. Report called to [place], pt transferred to ICU Rm 8. Nurse from [place] Caryl Pina) here at time of transfer, took pt's meds, glasses and belongings back with her. Please call with updates [phone].

## 2015-03-11 NOTE — Progress Notes (Signed)
GI Inpatient Follow-up Note  Patient Identification: Julie Anthony is a 75 y.o. female with regurgitation  Subjective:  Pt folowed by Dr Candace Cruise in hospital. I am asked to see her regarding regurgitation and possible esophgeal dysmotility.  Pt reports need to regurgitate after eating. Also witnessed by nursing staff.  Has worsening pna, question if this is related to aspiration.  No rectal bleeding, melena, hematemesis.   Scheduled Inpatient Medications:  . antiseptic oral rinse  7 mL Mouth Rinse q12n4p  . aspirin EC  81 mg Oral Daily  . atenolol  50 mg Oral QHS  . budesonide (PULMICORT) nebulizer solution  0.5 mg Nebulization BID  . chlorhexidine  15 mL Mouth Rinse BID  . cholecalciferol  1,000 Units Oral Daily  . clopidogrel  75 mg Oral Daily  . docusate sodium  100 mg Oral BID  . DULoxetine  60 mg Oral Daily  . famotidine  20 mg Oral BID  . feeding supplement (ENSURE ENLIVE)  237 mL Oral BID BM  . guaiFENesin  600 mg Oral BID  . ipratropium-albuterol  3 mL Nebulization Q4H  . isosorbide mononitrate  30 mg Oral Daily  . lactulose  30 g Oral Daily  . levothyroxine  175 mcg Oral QHS  . lisinopril  5 mg Oral Daily  . loratadine  10 mg Oral Daily  . meropenem (MERREM) IV  1 g Intravenous 3 times per day  . methylPREDNISolone (SOLU-MEDROL) injection  20 mg Intravenous Q12H  . metoCLOPramide  5 mg Oral TID AC & HS  . phenytoin  300 mg Oral QHS  . senna  2 tablet Oral QHS  . sodium chloride  3 mL Intravenous Q12H  . traZODone  50 mg Oral QHS  . verapamil  120 mg Oral Daily   And  . verapamil  180 mg Oral Daily    Continuous Inpatient Infusions:   . sodium chloride      PRN Inpatient Medications:  bisacodyl, budesonide (PULMICORT) nebulizer solution, calcium carbonate, morphine injection, ondansetron **OR** ondansetron (ZOFRAN) IV  Review of Systems: Constitutional: Weight is stable.  Eyes: No changes in vision. ENT: No oral lesions, sore throat.  GI: see HPI.   Heme/Lymph: No easy bruising.  CV: No chest pain.  GU: No hematuria.  Integumentary: No rashes.  Neuro: No headaches.  Psych: No depression/anxiety.  Endocrine: No heat/cold intolerance.  Allergic/Immunologic: No urticaria.  Resp: No cough, SOB.  Musculoskeletal: No joint swelling.    Physical Examination: BP 127/65 mmHg  Pulse 53  Temp(Src) 98.1 F (36.7 C) (Axillary)  Resp 16  Ht 5\' 5"  (1.651 m)  Wt 75.454 kg (166 lb 5.5 oz)  BMI 27.68 kg/m2  SpO2 100% Gen: NAD, alert and oriented x 3 Neck: supple, no JVD or thyromegaly Chest: decreased bilat, + crackles, no wheeze CV: RRR, no m/g/c/r Abd: soft, NT, ND, +BS in all four quadrants; no HSM, guarding, ridigity, or rebound tenderness Ext: no edema, well perfused with 2+ pulses, Skin: no rash or lesions noted Lymph: no LAD  Data: Lab Results  Component Value Date   WBC 7.0 03/08/2015   HGB 12.3 03/08/2015   HCT 38.4 03/08/2015   MCV 95.9 03/08/2015   PLT 148* 03/10/2015    Recent Labs Lab 03/06/15 0206 03/07/15 0904 03/08/15 0446  HGB 12.6 12.3 12.3   Lab Results  Component Value Date   NA 140 03/08/2015   K 4.0 03/08/2015   CL 101 03/08/2015   CO2 36* 03/08/2015  BUN 27* 03/08/2015   CREATININE 0.55 03/11/2015   Lab Results  Component Value Date   ALT 541* 03/10/2015   AST 123* 03/10/2015   ALKPHOS 99 03/10/2015   BILITOT 0.3 03/10/2015    Recent Labs Lab 02/20/2015 1300  APTT 27  INR 1.46   Speech and swallow eval:  Normal OP swallow, ? Esophageal dysmotility  Assessment/Plan: [name] is a 75 y.o. female with Pna, regurgitation, ? Esophageal dysmotility.    - Upper GI series to eval for causes of regurgitation, ? Hiatal hernia - would not be indication for PEG, would not prevent or improve regurgitation or decrease risk of aspiration.    Please call with questions or concerns.  [place] Gluth, Grace Blight, MD

## 2015-03-11 NOTE — Progress Notes (Signed)
[facility] NAME: Julie Anthony    MR#:  [id number]  DATE OF BIRTH:  03-14-1940  SUBJECTIVE:  CHIEF COMPLAINT:   Chief Complaint  Patient presents with  . Dysphagia  . Cough   the patient is a 75 year old Caucasian female with history of dementia, coronary artery disease, COPD, asthma, chronic respiratory failure on 2 L of oxygen through nasal cannula at home, who lives alone presents to the hospital with complaints of cough, chest congestion as well as shortness of breath. In emergency room, she was noted to have elevated troponin to 5.5, but had no chest pain. Patient's chest x-ray was concerning for left basilar infiltrate, repeat a chest x-ray today revealed worsening pneumonia versus atelectasis , concerning for mucous plugging.  Cardiologist does not recommend any interventions, but conservative therapy.   CT of chest on the 23rd of January revealed left upper and left lower lobe pulmonary opacity suspicious for infection or aspiration, pulmonary artery enlargement suggesting pulmonary hypertension. Patient was transferred to intensive care unit yesterday because of worsening hypoxemia, she was initiated on BiPAP, but now she is weaned off. It. Recommended pulmonary PT, percussive therapy/bed. Patient has been coughing and patient's O2 sats improved and now she is on 3 L of oxygen through nasal cannula with saturations of 97%. LFTs had improved since  patient's statin was stopped The patient was evaluated by speech therapist yesterday and felt the patient has been regurgitating,  could have been aspirating regurgitated food. KUB was unremarkable Oral intake was good at 80% of offered to meals. Chest x-ray reveals worsening of left and now right-sided pneumonia. Given Haldol at night and poorly responsive in the morning. ABGs were performed and patient was noted to have PCO2 elevation at 80 and acidosis, transferred to CCU and placed on  BiPAP. Remains full code   Review of Systems  Unable to perform ROS: mental acuity    VITAL SIGNS: Blood pressure 125/65, pulse 55, temperature 98.2 F (36.8 C), temperature source Oral, resp. rate 18, height 5\' 5"  (1.651 m), weight 75.454 kg (166 lb 5.5 oz), SpO2 94 %.  PHYSICAL EXAMINATION:   GENERAL:  75 y.o.-year-old patient slumped in the bed in mild respiratory distress. Unresponsive to light shaking EYES: Pupils equal, round, reactive to light and accommodation. No scleral icterus. Extraocular muscles intact.  HEENT: Head atraumatic, normocephalic. Oropharynx and nasopharynx clear.  NECK:  Supple, no jugular venous distention. No thyroid enlargement, no tenderness.  LUNGS:Diminished breath sounds on the left, mostly at the base, no diminished on the right as well,  Few rhonchi  were noted bilaterally . Using accessory muscles of respiration. Kyphosis, slumped in bed  CARDIOVASCULAR: S1, S2 , bradycardic, regular. No murmurs, rubs, or gallops.  ABDOMEN: Soft, nontender, nondistended. Bowel sounds present. No organomegaly or mass.  EXTREMITIES: No pedal edema, cyanosis, or clubbing.  NEUROLOGIC: Cranial nerves difficult to evaluate this patient is poorly responsive. Muscle strength , unable to evaluate. Sensation unable to examine. Gait not checked.  PSYCHIATRIC: The patient is unresponsive  SKIN: No obvious rash, lesion, or ulcer.   ORDERS/RESULTS REVIEWED:   CBC  Recent Labs Lab 02/16/2015 1300 03/06/15 0206 03/07/15 0904 03/08/15 0446 03/09/15 0434 03/10/15 0826  WBC 12.1* 11.9* 8.3 7.0  --   --   HGB 13.7 12.6 12.3 12.3  --   --   HCT 41.6 38.7 37.8 38.4  --   --   PLT 232 211 145* 123* 131*  148*  MCV 93.9 96.7 94.9 95.9  --   --   MCH 31.0 31.6 30.9 30.7  --   --   MCHC 33.0 32.6 32.6 32.0  --   --   RDW 14.7* 15.0* 15.4* 15.3*  --   --   LYMPHSABS 0.8*  --   --   --   --   --   MONOABS 0.9  --   --   --   --   --   EOSABS 0.0  --   --   --   --   --   BASOSABS  0.0  --   --   --   --   --    ------------------------------------------------------------------------------------------------------------------  Chemistries   Recent Labs Lab 03/06/15 0206 03/06/15 2006 03/07/15 0904 03/08/15 0446 03/08/15 1939 03/10/15 0826 03/11/15 0436  NA 140 135 141 142 140  --   --   K 3.2* 3.4* 4.2 4.2 4.0  --   --   CL 98* 97* 100* 102 101  --   --   CO2 31 32 34* 35* 36*  --   --   GLUCOSE 124* 132* 129* 122* 176*  --   --   BUN 55* 50* 47* 39* 27*  --   --   CREATININE 1.70* 1.14* 0.85 0.77 0.65  --  0.55  CALCIUM 7.8* 7.4* 8.1* 8.1* 7.9*  --   --   AST 932* 728*  --  464* 272* 123*  --   ALT 1148* 1105*  --  1002* 763* 541*  --   ALKPHOS 114 118  --  121 101 99  --   BILITOT 1.1 0.6  --  0.6 0.4 0.3  --    ------------------------------------------------------------------------------------------------------------------ estimated creatinine clearance is 60.8 mL/min (by C-G formula based on Cr of 0.55). ------------------------------------------------------------------------------------------------------------------ No results for input(s): TSH, T4TOTAL, T3FREE, THYROIDAB in the last 72 hours.  Invalid input(s): FREET3  Cardiac Enzymes  Recent Labs Lab 03/13/2015 2030 03/06/15 0206 03/06/15 0743  TROPONINI 3.97* 2.95* 2.46*   ------------------------------------------------------------------------------------------------------------------ Invalid input(s): POCBNP ---------------------------------------------------------------------------------------------------------------  RADIOLOGY: Dg Abd 1 View  03/10/2015  CLINICAL DATA:  75 year old female with sudden onset regurgitation. Cough, congestion, shortness of breath. Initial encounter. EXAM: ABDOMEN - 1 VIEW COMPARISON:  Chest CT 03/07/2015.  Pelvis radiograph 05/20/2006. FINDINGS: Supine view of the abdomen at 1631 hours. Advanced thoracolumbar scoliosis and degenerative changes. Chronic  appearing left inferior pubic ramus fracture. No definite acute osseous abnormality identified. Non obstructed bowel gas pattern. Abdominal and pelvic visceral contours are within normal limits. No definite pneumoperitoneum on this supine view. Calcified aortic atherosclerosis. Pelvic phleboliths. Calcified femoral artery atherosclerosis. Probable cholecystectomy clips in the right upper quadrant. IMPRESSION: Non obstructed bowel gas pattern. Electronically Signed   By: Genevie Ann M.D.   On: 03/10/2015 16:49   Dg Chest Port 1 View  03/11/2015  CLINICAL DATA:  Follow-up pneumonia.  Subsequent encounter. EXAM: PORTABLE CHEST 1 VIEW COMPARISON:  Chest radiograph performed 03/10/2015 FINDINGS: Worsening pneumonia is noted, with new right basilar airspace opacity and persistent complete opacification of the left hemithorax. An underlying small left pleural effusion cannot be excluded. No pneumothorax is seen. The cardiomediastinal silhouette is not well assessed due to complete opacification of the left hemithorax. No acute osseous abnormalities are seen. Clips are noted overlying the right axilla. IMPRESSION: Worsening pneumonia, with new right basilar airspace opacity and persistent complete opacification of the left hemithorax. Cannot exclude underlying small left pleural effusion. Electronically  Signed   By: Garald Balding M.D.   On: 03/11/2015 06:31   Dg Chest Port 1 View  03/10/2015  CLINICAL DATA:  Acute onset of shortness of breath. Initial encounter. EXAM: PORTABLE CHEST 1 VIEW COMPARISON:  Chest radiograph performed 03/08/2015 FINDINGS: There is worsening diffuse left-sided airspace opacification, which may reflect worsening pneumonia, though an underlying increased left-sided pleural effusion is a possibility. The right lung is mildly hypoexpanded, with mild vascular crowding. No pneumothorax is seen. Mild leftward mediastinal shift suggests mild left-sided volume loss. No acute osseous abnormalities are  identified. Clips are seen overlying the right axilla. IMPRESSION: Worsening diffuse left-sided airspace opacification may reflect worsening pneumonia, though an underlying increased left-sided pleural effusion is a possibility. Right lung mildly hypoexpanded. Electronically Signed   By: Garald Balding M.D.   On: 03/10/2015 06:37    EKG:  Orders placed or performed during the hospital encounter of 03/08/2015  . ED EKG  . ED EKG    ASSESSMENT AND PLAN:  Principal Problem:   NSTEMI (non-ST elevated myocardial infarction) ([place]) Active Problems:   HCAP (healthcare-associated pneumonia)   Abnormal LFTs   ARF (acute renal failure) ([place]) 1. Acute on chronic respiratory failure with hypoxia and hypercapnia due to left lower lobe pneumonia, concerning for aspiration pneumonia, worsening due to continuous regurgitations , appreciate pulmonologist input,   , continue BiPAP therapy for now , as well as antibiotics, discussed this pulmonologist for, bronchoscopy was not recommended. Patient was noted to be regurgitating back food.  I suspect patient's regurgitation could be related to her kyphosis. Patient would benefit from barium swallowing or EGD, however, not feasible at this time due to pneumonia 2. Multifocal pneumonia, continue meropenem , sputum cultures are pending.  Blood cultures were negative. Continue Humibid,  IV steroids and Pulmicort, worsened overnight, now on BiPAP, with palliative care involved 3. Acute renal failure, resolved with IV fluid administration, unremarkable urinalysis, stable with lisinopril initiation 4. Leukocytosis, resolved 5. Non-Q-wave MI, type 2, due to demand ischemia, per cardiologist, continue aspirin, heparin , nitrates, atenolol, no intervention was recommended 6. Elevated transaminases , likely due to statin, improved holding statin. Ultrasound of abdomen revealed a normal common bile duct and unremarkable liver. Following LFTs intermittently 7. Regurgitation,  likely related to kyphosis, slow gastrointestinal passage, unremarkable KUB , now nothing by mouth, on low rate IV fluids   Management plans discussed with the patient, family and they are in agreement.   DRUG ALLERGIES:  Allergies  Allergen Reactions  . Penicillins Rash  . Codeine Other (See Comments)    Unknown reaction.  . Ceftin [Cefuroxime Axetil] Hives    Unknown reaction  . Erythromycin Other (See Comments)    Unknown reaction  . Ibuprofen Other (See Comments)    Unknown reaction  . Sulfa Antibiotics Other (See Comments)    Unknown reaction    CODE STATUS:     Code Status Orders        Start     Ordered   02/28/2015 1930  Full code   Continuous     02/16/2015 1929    Code Status History    Date Active Date Inactive Code Status Order ID Comments User Context   This patient has a current code status but no historical code status.      TOTAL  CRITICAL CARE TIME TAKING CARE OF THIS PATIENT: 40 minutes.    Theodoro Grist M.D on 03/11/2015 at 4:36 PM  Between 7am to 6pm - Pager - [phone]  After 6pm go to [email or web address] - password [email or web address] [facility]  Office  [phone]  CC: Primary care physician; [employer]

## 2015-03-12 LAB — BLOOD GAS, ARTERIAL
ACID-BASE EXCESS: 11 mmol/L — AB (ref 0.0–3.0)
Bicarbonate: 40.8 mEq/L — ABNORMAL HIGH (ref 21.0–28.0)
DELIVERY SYSTEMS: POSITIVE
Expiratory PAP: 5
FIO2: 0.5
Inspiratory PAP: 16
MECHANICAL RATE: 12
O2 Saturation: 87.2 %
PCO2 ART: 83 mmHg — AB (ref 32.0–48.0)
Patient temperature: 37
pH, Arterial: 7.3 — ABNORMAL LOW (ref 7.350–7.450)
pO2, Arterial: 59 mmHg — ABNORMAL LOW (ref 83.0–108.0)

## 2015-03-12 LAB — GLUCOSE, CAPILLARY: Glucose-Capillary: 114 mg/dL — ABNORMAL HIGH (ref 65–99)

## 2015-03-12 MED ORDER — HYDRALAZINE HCL 20 MG/ML IJ SOLN
20.0000 mg | INTRAMUSCULAR | Status: DC | PRN
  Administered 2015-03-12 – 2015-03-18 (×3): 20 mg via INTRAVENOUS
  Filled 2015-03-12 (×3): qty 1

## 2015-03-12 NOTE — Progress Notes (Addendum)
Called [facility] to report pt was found unresponsive with alarms indicating sats dropped to 70s on [place]. Placed on bipap and sats back up to 90-91%. Pt was reported to have been yelling and pulling off mask all day and now pt is not responding to any stimuli. Did lift head once bipap in place, but not very responsive. Called to ask if abg is necessary, concerned about pt need to be on vent if unable to maintain sats. [facility] MD to place orders.

## 2015-03-12 NOTE — Progress Notes (Signed)
Respiratory called Dr. Emmit Alexanders to report ABG critical result. Will continue to monitor pt on bipap at this time.

## 2015-03-12 NOTE — Progress Notes (Signed)
PULMONARY / CRITICAL CARE MEDICINE   Name: Julie Anthony MRN: [id number] DOB: [birth year]    ADMISSION DATE:  02/13/2015   CC follow up resp failure  HPI re-admitted to ICU for increased WOB, unresponsive On bIPAP, high risk for intubation  STUDIES:  1/23 CT Chest - Left upper and left lower lobe pulmonary opacities, suspicious for infection or aspiration. Fluid in the left endobronchial tree could relate to mucous plugging or aspiration. Enlarged PA.  SIGNIFICANT EVENTS: -severe resp acidosis, transferred to ICU placed on Bipap fio2 at 50%, remains lethargic  VITAL SIGNS: Temp:  [97.5 F (36.4 C)-98.3 F (36.8 C)] 97.5 F (36.4 C) (01/28 0757) Pulse Rate:  [33-104] 79 (01/28 0900) Resp:  [15-25] 15 (01/28 0900) BP: (69-192)/(37-162) 164/91 mmHg (01/28 0900) SpO2:  [78 %-100 %] 93 % (01/28 0900) FiO2 (%):  [50 %] 50 % (01/28 0738) Weight:  [170 lb 6.7 oz (77.3 kg)] 170 lb 6.7 oz (77.3 kg) (01/28 0407) HEMODYNAMICS:   VENTILATOR SETTINGS: Vent Mode:  [-]  FiO2 (%):  [50 %] 50 % INTAKE / OUTPUT:  Intake/Output Summary (Last 24 hours) at 03/12/15 0958 Last data filed at 03/12/15 0900  Gross per 24 hour  Intake 249.17 ml  Output    100 ml  Net 149.17 ml    Review of Systems  Unable to perform ROS: critical illness  Respiratory: Positive for shortness of breath and wheezing.     Physical Exam  Constitutional: She appears distressed.  HENT:  Head: Normocephalic and atraumatic.  Eyes: Pupils are equal, round, and reactive to light.  Neck: Normal range of motion. Neck supple.  Cardiovascular: Normal rate, regular rhythm and normal heart sounds.   No murmur heard. Pulmonary/Chest: She is in respiratory distress. She has wheezes. She has rales.  Abdominal: Soft. Bowel sounds are normal.  Musculoskeletal: Normal range of motion.  Neurological:  lethargic  Skin: Skin is warm. She is diaphoretic.  Nursing note and vitals reviewed.    LABS:  CBC  Recent  Labs Lab 03/06/15 0206 03/07/15 0904 03/08/15 0446 03/09/15 0434 03/10/15 0826  WBC 11.9* 8.3 7.0  --   --   HGB 12.6 12.3 12.3  --   --   HCT 38.7 37.8 38.4  --   --   PLT 211 145* 123* 131* 148*   Coag's  Recent Labs Lab 03/09/2015 1300  APTT 27  INR 1.46   BMET  Recent Labs Lab 03/07/15 0904 03/08/15 0446 03/08/15 1939 03/11/15 0436  NA 141 142 140  --   K 4.2 4.2 4.0  --   CL 100* 102 101  --   CO2 34* 35* 36*  --   BUN 47* 39* 27*  --   CREATININE 0.85 0.77 0.65 0.55  GLUCOSE 129* 122* 176*  --    Electrolytes  Recent Labs Lab 03/07/15 0904 03/08/15 0446 03/08/15 1939  CALCIUM 8.1* 8.1* 7.9*   Sepsis Markers No results for input(s): LATICACIDVEN, PROCALCITON, O2SATVEN in the last 168 hours. ABG  Recent Labs Lab 03/07/15 1636 03/08/15 1245 03/11/15 1003  PHART 7.29* 7.36 7.33*  PCO2ART 82* 68* 85*  PO2ART 68* 95 53*   Liver Enzymes  Recent Labs Lab 03/08/15 0446 03/08/15 1939 03/10/15 0826  AST 464* 272* 123*  ALT 1002* 763* 541*  ALKPHOS 121 101 99  BILITOT 0.6 0.4 0.3  ALBUMIN 3.1* 3.0* 3.2*   Cardiac Enzymes  Recent Labs Lab 02/24/2015 2030 03/06/15 0206 03/06/15 0743  TROPONINI 3.97* 2.95*  2.46*   Glucose  Recent Labs Lab 03/08/15 0848 03/09/15 0805 03/09/15 1204 03/10/15 0720 03/11/15 1117 03/12/15 0757  GLUCAP 118* 104* 146* 81 116* 114*    Imaging No results found.  LINES:   CULTURES: Bld Cx x 2 1/21 >>negative to date MRSA >>negative Sputum Cx 1/22>>  ANTIBIOTICS Levaquin 1/21>>1/24 Meropenem 1/24>> Vanc 1/21>>1/24 Aztreonam 1/21>>1/24  ASSESSMENT / PLAN: 75 yo female with PMHx of COPD on 2L, Dementia, CAD, admitted for NSTEMI and AECOPD.  Noted to have LLL opacity, worsening respiratory distress on biPAP  1.AECOPD-biPAP as needed/tolerated - cont with steroids and nebs - check ABG as needed - f/u sputum cultures -high risk for intubation   2.LLL opacity c/w pneumonia with mucus pluggs -CT  chest review  -continue abx   3.Multifocal Pneumonia - plan as stated above - antibiotics as stated above  4.NSTEMI - CE if needed - cont with asa, heparin, nitrates, BP control   The Patient requires high complexity decision making for assessment and support, frequent evaluation and titration of therapies, application of advanced monitoring technologies and extensive interpretation of multiple databases. Critical Care Time devoted to patient care services described in this note is 45 minutes.   Overall, patient is critically ill, prognosis is guarded.  At high risk for cardiac arrest and death.  Palliative care team consulted-recommend DNR/DNI status   Tarri Guilfoil Patricia Pesa, M.D.  [facility]  Medical Director [place] Director [place] Department

## 2015-03-12 NOTE — Progress Notes (Signed)
Pt unable to tolerate CPT at this time. 

## 2015-03-12 NOTE — Progress Notes (Signed)
ANTIBIOTIC CONSULT NOTE - FOLLOW UP   Pharmacy Consult for Antibiotic Dosing Day 7 (meropenem Day 5)    Indication: pneumonia  Allergies  Allergen Reactions  . Penicillins Rash  . Codeine Other (See Comments)    Unknown reaction.  . Ceftin [Cefuroxime Axetil] Hives    Unknown reaction  . Erythromycin Other (See Comments)    Unknown reaction  . Ibuprofen Other (See Comments)    Unknown reaction  . Sulfa Antibiotics Other (See Comments)    Unknown reaction    Patient Measurements: Height: 5\' 5"  (165.1 cm) Weight: 170 lb 6.7 oz (77.3 kg) IBW/kg (Calculated) : 57 Adjusted Body Weight: 61 kg   Vital Signs: Temp: 97.5 F (36.4 C) (01/28 0757) Temp Source: Axillary (01/28 0757) BP: 164/91 mmHg (01/28 0900) Pulse Rate: 79 (01/28 0900) Intake/Output from previous day: 01/27 0701 - 01/28 0700 In: 149.2 [I.V.:49.2; IV Piggyback:100] Out: 200 [Urine:200] Intake/Output from this shift: Total I/O In: 100 [I.V.:100] Out: -   Labs:  Recent Labs  03/10/15 0826 03/11/15 0436  PLT 148*  --   CREATININE  --  0.55   Estimated Creatinine Clearance: 61.5 mL/min (by C-G formula based on Cr of 0.55).   Microbiology: Recent Results (from the past 720 hour(s))  Culture, blood (Routine X 2) w Reflex to ID Panel     Status: None   Collection Time: 03/14/2015  1:00 PM  Result Value Ref Range Status   Specimen Description BLOOD LEFT ANTECUBITAL  Final   Special Requests BOTTLES DRAWN AEROBIC AND ANAEROBIC  1CC  Final   Culture NO GROWTH 5 DAYS  Final   Report Status 03/10/2015 FINAL  Final  Culture, blood (Routine X 2) w Reflex to ID Panel     Status: None   Collection Time: 02/20/2015  1:04 PM  Result Value Ref Range Status   Specimen Description BLOOD LEFT HAND  Final   Special Requests BOTTLES DRAWN AEROBIC AND ANAEROBIC  1CC  Final   Culture NO GROWTH 5 DAYS  Final   Report Status 03/10/2015 FINAL  Final  MRSA PCR Screening     Status: None   Collection Time: 03/07/15  5:32 PM   Result Value Ref Range Status   MRSA by PCR NEGATIVE NEGATIVE Final    Comment:        The GeneXpert MRSA Assay (FDA approved for NASAL specimens only), is one component of a comprehensive MRSA colonization surveillance program. It is not intended to diagnose MRSA infection nor to guide or monitor treatment for MRSA infections.   MRSA PCR Screening     Status: None   Collection Time: 03/11/15 11:20 AM  Result Value Ref Range Status   MRSA by PCR NEGATIVE NEGATIVE Final    Comment:        The GeneXpert MRSA Assay (FDA approved for NASAL specimens only), is one component of a comprehensive MRSA colonization surveillance program. It is not intended to diagnose MRSA infection nor to guide or monitor treatment for MRSA infections.     Medical History: Past Medical History  Diagnosis Date  . Cancer [facility])     right breast cancer  . S/P radiation therapy     right breast cancer    Medications:  Prescriptions prior to admission  Medication Sig Dispense Refill Last Dose  . acetaminophen (TYLENOL ARTHRITIS PAIN) 650 MG CR tablet Take 1,300 mg by mouth 2 (two) times daily.   unknown  . albuterol (PROVENTIL HFA;VENTOLIN HFA) 108 (90 Base) MCG/ACT  inhaler Inhale 2 puffs into the lungs See admin instructions. Inhale 2 puffs every 4 to 6 hours as needed for shortness of breath.   prn  . albuterol (PROVENTIL) (2.5 MG/3ML) 0.083% nebulizer solution Take 2.5 mg by nebulization 4 (four) times daily as needed for wheezing or shortness of breath (cough).   prn  . aspirin EC 81 MG tablet Take 81 mg by mouth at bedtime.   unknown  . atenolol (TENORMIN) 50 MG tablet Take 50 mg by mouth at bedtime.   unknown  . atorvastatin (LIPITOR) 20 MG tablet Take 20 mg by mouth at bedtime.   unknown  . [EXPIRED] azithromycin (ZITHROMAX) 250 MG tablet Take 250-500 mg by mouth See admin instructions. Take 2 tablets (500mg ) by mouth now, then 1 tablet by mouth every day x 4 days.   unknown  . calcium  carbonate (TUMS) 500 MG chewable tablet Chew 1,000 mg by mouth every 2 (two) hours as needed for indigestion or heartburn. *Maximum 10 tablets a day*   unknown  . Carboxymethylcellul-Glycerin 0.5-0.9 % SOLN Apply 1-2 drops to eye 3 (three) times daily as needed (for dry eye.).   prn  . cetirizine (ZYRTEC) 10 MG tablet Take 10 mg by mouth at bedtime.   unknown  . Cholecalciferol 1000 units capsule Take 1,000 Units by mouth daily.   unknown  . diclofenac sodium (VOLTAREN) 1 % GEL Apply 2-4 g topically See admin instructions. Apply 2 grams to affected area above waist, and 4 grams to affected area below the waist up to 4 times as needed for pain.   unknown  . docusate sodium (COLACE) 100 MG capsule Take 100 mg by mouth daily.   unknown  . DULoxetine (CYMBALTA) 60 MG capsule Take 60 mg by mouth daily.   unknown  . fluticasone (FLOVENT DISKUS) 50 MCG/BLIST diskus inhaler Inhale 2 puffs into the lungs 2 (two) times daily.   unknown  . levothyroxine (SYNTHROID, LEVOTHROID) 175 MCG tablet Take 175 mcg by mouth at bedtime.   unknown  . losartan-hydrochlorothiazide (HYZAAR) 100-25 MG tablet Take 1 tablet by mouth daily.   unknown  . phenytoin (DILANTIN) 100 MG ER capsule Take 300 mg by mouth at bedtime.   unknown  . [EXPIRED] predniSONE (DELTASONE) 20 MG tablet Take 40 mg by mouth daily. X 5 days.   unknown  . ranitidine (ZANTAC) 150 MG capsule Take 150 mg by mouth 2 (two) times daily.   unknown  . senna (SENOKOT) 8.6 MG tablet Take 2 tablets by mouth at bedtime.   unknown  . sodium chloride (NASAL MOIST) 0.65 % nasal spray Place 2 sprays into the nose every 2 (two) hours as needed for congestion.   prn  . traZODone (DESYREL) 50 MG tablet Take 50 mg by mouth at bedtime.   unknown  . Verapamil HCl CR 300 MG CP24 Take 300 mg by mouth daily.   unknown   Assessment: Pharmacy consulted to dose meropenem and levofloxacin for 75 yo female being treated for PNA. Patient previously on Levofloxacin as well.  Plan:   Will continue Merrem 1 g IV q8 hours, as CrCl >50 ml/min.  Pharmacy will continue to monitor for changes in renal function and adjust per consult.   Roe Coombs, PharmD Pharmacy Resident 03/12/2015

## 2015-03-12 NOTE — Progress Notes (Signed)
Called MD for order for bear hugger since pt temp 94.5 rectal. Dr. Claria Dice gave order.

## 2015-03-12 NOTE — Progress Notes (Signed)
Patient placed on bipap due to decreased O2 sat on Venti mask.

## 2015-03-12 NOTE — Progress Notes (Signed)
[place] [facility] [place] NAME: Julie Anthony    MR#:  [id number]  DATE OF BIRTH:  1940/02/20  SUBJECTIVE:  CHIEF COMPLAINT:   Chief Complaint  Patient presents with  . Dysphagia  . Cough   the patient is a 75 year old Caucasian female with chronic respiratory failure on 2 L of oxygen through nasal cannula at home, who lives alone presents to the hospital with complaints of cough, chest congestion as well as shortness of breath. .   . ABGs were performed and patient was noted to have PCO2 elevation at 80 and acidosis, transferred to CCU and placed on BiPAP. Remains full code  today duing my examination patient is stll onBiPAP. Hungry and wants to eat. Patient is curently noting by moth as  Recommended by ST for possible aspiration    Review of Systems  Unable to perform ROS: mental acuity   on BiPAP VITAL SIGNS: Blood pressure 195/113, pulse 79, temperature 98.2 F (36.8 C), temperature source Oral, resp. rate 15, height 5\' 5"  (1.651 m), weight 77.3 kg (170 lb 6.7 oz), SpO2 93 %.  PHYSICAL EXAMINATION:   GENERAL:  75 y.o.-year-old patient slumped in the bed in mild respiratory distress. Unresponsive to light shaking EYES: Pupils equal, round, reactive to light and accommodation. No scleral icterus. Extraocular muscles intact.  HEENT: Head atraumatic, normocephalic. Oropharynx and nasopharynx clear.  NECK:  Supple, no jugular venous distention. No thyroid enlargement, no tenderness.  LUNGS:Diminished breath sounds bilaterally, coarse  Few rhonchi  were noted bilaterally . Kyphosis, slumped in bed  CARDIOVASCULAR: S1, S2 , bradycardic, regular. No murmurs, rubs, or gallops.  ABDOMEN: Soft, nontender, nondistended. Bowel sounds present. No organomegaly or mass.  EXTREMITIES: No pedal edema, cyanosis, or clubbing.  NEUROLOGIC: Cranial nerves difficult to evaluate this patient is poorly responsive. Muscle strength , unable to evaluate.  Sensation unable to examine. Gait not checked.  PSYCHIATRIC: The patient is unresponsive  SKIN: No obvious rash, lesion, or ulcer.   ORDERS/RESULTS REVIEWED:   CBC  Recent Labs Lab 03/06/15 0206 03/07/15 0904 03/08/15 0446 03/09/15 0434 03/10/15 0826  WBC 11.9* 8.3 7.0  --   --   HGB 12.6 12.3 12.3  --   --   HCT 38.7 37.8 38.4  --   --   PLT 211 145* 123* 131* 148*  MCV 96.7 94.9 95.9  --   --   MCH 31.6 30.9 30.7  --   --   MCHC 32.6 32.6 32.0  --   --   RDW 15.0* 15.4* 15.3*  --   --    ------------------------------------------------------------------------------------------------------------------  Chemistries   Recent Labs Lab 03/06/15 0206 03/06/15 2006 03/07/15 0904 03/08/15 0446 03/08/15 1939 03/10/15 0826 03/11/15 0436  NA 140 135 141 142 140  --   --   K 3.2* 3.4* 4.2 4.2 4.0  --   --   CL 98* 97* 100* 102 101  --   --   CO2 31 32 34* 35* 36*  --   --   GLUCOSE 124* 132* 129* 122* 176*  --   --   BUN 55* 50* 47* 39* 27*  --   --   CREATININE 1.70* 1.14* 0.85 0.77 0.65  --  0.55  CALCIUM 7.8* 7.4* 8.1* 8.1* 7.9*  --   --   AST 932* 728*  --  464* 272* 123*  --   ALT 1148* 1105*  --  1002* 763* 541*  --  ALKPHOS 114 118  --  121 101 99  --   BILITOT 1.1 0.6  --  0.6 0.4 0.3  --    ------------------------------------------------------------------------------------------------------------------ estimated creatinine clearance is 61.5 mL/min (by C-G formula based on Cr of 0.55). ------------------------------------------------------------------------------------------------------------------ No results for input(s): TSH, T4TOTAL, T3FREE, THYROIDAB in the last 72 hours.  Invalid input(s): FREET3  Cardiac Enzymes  Recent Labs Lab 03/15/2015 2030 03/06/15 0206 03/06/15 0743  TROPONINI 3.97* 2.95* 2.46*   ------------------------------------------------------------------------------------------------------------------ Invalid input(s):  POCBNP ---------------------------------------------------------------------------------------------------------------  RADIOLOGY: Dg Abd 1 View  03/10/2015  CLINICAL DATA:  75 year old female with sudden onset regurgitation. Cough, congestion, shortness of breath. Initial encounter. EXAM: ABDOMEN - 1 VIEW COMPARISON:  Chest CT 03/07/2015.  Pelvis radiograph 05/20/2006. FINDINGS: Supine view of the abdomen at 1631 hours. Advanced thoracolumbar scoliosis and degenerative changes. Chronic appearing left inferior pubic ramus fracture. No definite acute osseous abnormality identified. Non obstructed bowel gas pattern. Abdominal and pelvic visceral contours are within normal limits. No definite pneumoperitoneum on this supine view. Calcified aortic atherosclerosis. Pelvic phleboliths. Calcified femoral artery atherosclerosis. Probable cholecystectomy clips in the right upper quadrant. IMPRESSION: Non obstructed bowel gas pattern. Electronically Signed   By: Genevie Ann M.D.   On: 03/10/2015 16:49   Dg Chest Port 1 View  03/11/2015  CLINICAL DATA:  Follow-up pneumonia.  Subsequent encounter. EXAM: PORTABLE CHEST 1 VIEW COMPARISON:  Chest radiograph performed 03/10/2015 FINDINGS: Worsening pneumonia is noted, with new right basilar airspace opacity and persistent complete opacification of the left hemithorax. An underlying small left pleural effusion cannot be excluded. No pneumothorax is seen. The cardiomediastinal silhouette is not well assessed due to complete opacification of the left hemithorax. No acute osseous abnormalities are seen. Clips are noted overlying the right axilla. IMPRESSION: Worsening pneumonia, with new right basilar airspace opacity and persistent complete opacification of the left hemithorax. Cannot exclude underlying small left pleural effusion. Electronically Signed   By: Garald Balding M.D.   On: 03/11/2015 06:31    EKG:  Orders placed or performed during the hospital encounter of 02/20/2015   . ED EKG  . ED EKG    ASSESSMENT AND PLAN:  Principal Problem:   NSTEMI (non-ST elevated myocardial infarction) ([place]) Active Problems:   HCAP (healthcare-associated pneumonia)   Abnormal LFTs   ARF (acute renal failure) ([place]) 1. Acute on chronic respiratory failure with hypoxia and hypercapnia due to acute exacerbation of COPD, left lower lobe pneumonia, concerning for aspiration pneumonia, worsening due to continuous regurgitations  appreciate pulmonologist input continue BiPAP therapy for now , as well as antibiotics Follow-up sputum cultures collected , Patient would benefit from barium swallowing or EGD, however, not feasible at this time due to pneumonia  2. Multifocal pneumonia  continue meropenem , sputum cultures are pending.   Blood cultures were negative. Continue Humibid,  IV steroids and Pulmicort,  on BiPAP,  Pending palliative care consult Currently patient is nothing by mouth for possible aspiration Speech therapy cannot assess the patient as the patient is on BiPAP  3. Acute renal failure, resolved with IV fluid administration, unremarkable urinalysis, stable with lisinopril initiation  4. Leukocytosis, resolved  5. Non-Q-wave MI, type 2, due to demand ischemia, per cardiologist, continue aspirin, heparin , nitrates, atenolol, no intervention was recommended  6. Elevated transaminases , likely due to statin, improved holding statin. Ultrasound of abdomen revealed a normal common bile duct and unremarkable liver. Following LFTs intermittently  7. Regurgitation, likely related to kyphosis, slow gastrointestinal passage, unremarkable KUB , now nothing  by mouth, on low rate IV fluids   Management plans discussed with the patient, palliative care consult is pending Could not provide any kind of nutrition via NG tube or orogastric tube as patient is  on BiPAP, high risk for intubation   DRUG ALLERGIES:  Allergies  Allergen Reactions  . Penicillins Rash  .  Codeine Other (See Comments)    Unknown reaction.  . Ceftin [Cefuroxime Axetil] Hives    Unknown reaction  . Erythromycin Other (See Comments)    Unknown reaction  . Ibuprofen Other (See Comments)    Unknown reaction  . Sulfa Antibiotics Other (See Comments)    Unknown reaction    CODE STATUS:     Code Status Orders        Start     Ordered   03/15/2015 1930  Full code   Continuous     03/03/2015 1929    Code Status History    Date Active Date Inactive Code Status Order ID Comments User Context   This patient has a current code status but no historical code status.      TOTAL  CRITICAL CARE TIME TAKING CARE OF THIS PATIENT: 40 minutes.    Nicholes Mango M.D on 03/12/2015 at 2:29 PM  Between 7am to 6pm - Pager - [phone]  After 6pm go to [email or web address] - password [email or web address] Page Hospitalists  Office  [phone]  CC: Primary care physician; [place]

## 2015-03-12 NOTE — Progress Notes (Signed)
Pt does not like the CPT. Pt is not in a bed that automatically does CPT and while on BIPAP pt is unable to try a flutter valve.

## 2015-03-12 NOTE — Progress Notes (Signed)
Patient is yelling out and has removed her bipap. She cannot understand she needs to wear bipap in order to breathe. PRN morphine given for patient's complaint of pain and elevated blood pressure. Respiratory therapy put patient on 6 liter nasal cannula after she received a breathing treatment.

## 2015-03-12 NOTE — Progress Notes (Signed)
Pt becoming very agitated with BIPAP, RN asked if pt could get a break. Place pt on 50% VM, pt tol well at this time will cont to monitor.

## 2015-03-12 NOTE — Progress Notes (Signed)
Pt had to be placed back on Bipap due to decreased SPO2, increased BP and anxiety.

## 2015-03-12 NOTE — Progress Notes (Signed)
[facility]Link Physician-Brief Progress Note Patient Name: Julie Anthony DOB: 09-18-1940 MRN: [id number]   Date of Service  03/12/2015  HPI/Events of Note  Hypoxia - now back on BiPAP. Sat = 92%.  eICU Interventions  Will check ABG at 8:00 PM.     Intervention Category Major Interventions: Hypoxemia - evaluation and management  Alesandro Stueve Eugene 03/12/2015, 7:26 PM

## 2015-03-13 MED ORDER — ACETYLCYSTEINE 20 % IN SOLN
3.0000 mL | Freq: Two times a day (BID) | RESPIRATORY_TRACT | Status: DC
  Administered 2015-03-13 – 2015-03-19 (×12): 3 mL via RESPIRATORY_TRACT
  Administered 2015-03-19: 07:00:00 via RESPIRATORY_TRACT
  Administered 2015-03-20 (×2): 3 mL via RESPIRATORY_TRACT
  Administered 2015-03-21: 4 mL via RESPIRATORY_TRACT
  Filled 2015-03-13 (×16): qty 4

## 2015-03-13 NOTE — Progress Notes (Signed)
ANTIBIOTIC CONSULT NOTE - FOLLOW UP   Pharmacy Consult for Antibiotic Dosing Day 8 (meropenem Day 6)    Indication: pneumonia  Allergies  Allergen Reactions  . Penicillins Rash  . Codeine Other (See Comments)    Unknown reaction.  . Ceftin [Cefuroxime Axetil] Hives    Unknown reaction  . Erythromycin Other (See Comments)    Unknown reaction  . Ibuprofen Other (See Comments)    Unknown reaction  . Sulfa Antibiotics Other (See Comments)    Unknown reaction    Patient Measurements: Height: 5\' 5"  (165.1 cm) Weight: 170 lb 6.7 oz (77.3 kg) IBW/kg (Calculated) : 57 Adjusted Body Weight: 61 kg   Vital Signs: Temp: 96.6 F (35.9 C) (01/29 0800) Temp Source: Rectal (01/29 0400) BP: 126/65 mmHg (01/29 0800) Pulse Rate: 91 (01/29 0800) Intake/Output from previous day: 01/28 0701 - 01/29 0700 In: 1400 [I.V.:1200; IV Piggyback:200] Out: 200 [Urine:200] Intake/Output from this shift: Total I/O In: 50 [I.V.:50] Out: -   Labs:  Recent Labs  03/11/15 0436  CREATININE 0.55   Estimated Creatinine Clearance: 61.5 mL/min (by C-G formula based on Cr of 0.55).   Microbiology: Recent Results (from the past 720 hour(s))  Culture, blood (Routine X 2) w Reflex to ID Panel     Status: None   Collection Time: 03/04/2015  1:00 PM  Result Value Ref Range Status   Specimen Description BLOOD LEFT ANTECUBITAL  Final   Special Requests BOTTLES DRAWN AEROBIC AND ANAEROBIC  1CC  Final   Culture NO GROWTH 5 DAYS  Final   Report Status 03/10/2015 FINAL  Final  Culture, blood (Routine X 2) w Reflex to ID Panel     Status: None   Collection Time: 02/27/2015  1:04 PM  Result Value Ref Range Status   Specimen Description BLOOD LEFT HAND  Final   Special Requests BOTTLES DRAWN AEROBIC AND ANAEROBIC  1CC  Final   Culture NO GROWTH 5 DAYS  Final   Report Status 03/10/2015 FINAL  Final  MRSA PCR Screening     Status: None   Collection Time: 03/07/15  5:32 PM  Result Value Ref Range Status   MRSA  by PCR NEGATIVE NEGATIVE Final    Comment:        The GeneXpert MRSA Assay (FDA approved for NASAL specimens only), is one component of a comprehensive MRSA colonization surveillance program. It is not intended to diagnose MRSA infection nor to guide or monitor treatment for MRSA infections.   MRSA PCR Screening     Status: None   Collection Time: 03/11/15 11:20 AM  Result Value Ref Range Status   MRSA by PCR NEGATIVE NEGATIVE Final    Comment:        The GeneXpert MRSA Assay (FDA approved for NASAL specimens only), is one component of a comprehensive MRSA colonization surveillance program. It is not intended to diagnose MRSA infection nor to guide or monitor treatment for MRSA infections.     Medical History: Past Medical History  Diagnosis Date  . Cancer [facility])     right breast cancer  . S/P radiation therapy     right breast cancer    Medications:  Prescriptions prior to admission  Medication Sig Dispense Refill Last Dose  . acetaminophen (TYLENOL ARTHRITIS PAIN) 650 MG CR tablet Take 1,300 mg by mouth 2 (two) times daily.   unknown  . albuterol (PROVENTIL HFA;VENTOLIN HFA) 108 (90 Base) MCG/ACT inhaler Inhale 2 puffs into the lungs See admin instructions. Inhale  2 puffs every 4 to 6 hours as needed for shortness of breath.   prn  . albuterol (PROVENTIL) (2.5 MG/3ML) 0.083% nebulizer solution Take 2.5 mg by nebulization 4 (four) times daily as needed for wheezing or shortness of breath (cough).   prn  . aspirin EC 81 MG tablet Take 81 mg by mouth at bedtime.   unknown  . atenolol (TENORMIN) 50 MG tablet Take 50 mg by mouth at bedtime.   unknown  . atorvastatin (LIPITOR) 20 MG tablet Take 20 mg by mouth at bedtime.   unknown  . [EXPIRED] azithromycin (ZITHROMAX) 250 MG tablet Take 250-500 mg by mouth See admin instructions. Take 2 tablets (500mg ) by mouth now, then 1 tablet by mouth every day x 4 days.   unknown  . calcium carbonate (TUMS) 500 MG chewable tablet Chew  1,000 mg by mouth every 2 (two) hours as needed for indigestion or heartburn. *Maximum 10 tablets a day*   unknown  . Carboxymethylcellul-Glycerin 0.5-0.9 % SOLN Apply 1-2 drops to eye 3 (three) times daily as needed (for dry eye.).   prn  . cetirizine (ZYRTEC) 10 MG tablet Take 10 mg by mouth at bedtime.   unknown  . Cholecalciferol 1000 units capsule Take 1,000 Units by mouth daily.   unknown  . diclofenac sodium (VOLTAREN) 1 % GEL Apply 2-4 g topically See admin instructions. Apply 2 grams to affected area above waist, and 4 grams to affected area below the waist up to 4 times as needed for pain.   unknown  . docusate sodium (COLACE) 100 MG capsule Take 100 mg by mouth daily.   unknown  . DULoxetine (CYMBALTA) 60 MG capsule Take 60 mg by mouth daily.   unknown  . fluticasone (FLOVENT DISKUS) 50 MCG/BLIST diskus inhaler Inhale 2 puffs into the lungs 2 (two) times daily.   unknown  . levothyroxine (SYNTHROID, LEVOTHROID) 175 MCG tablet Take 175 mcg by mouth at bedtime.   unknown  . losartan-hydrochlorothiazide (HYZAAR) 100-25 MG tablet Take 1 tablet by mouth daily.   unknown  . phenytoin (DILANTIN) 100 MG ER capsule Take 300 mg by mouth at bedtime.   unknown  . [EXPIRED] predniSONE (DELTASONE) 20 MG tablet Take 40 mg by mouth daily. X 5 days.   unknown  . ranitidine (ZANTAC) 150 MG capsule Take 150 mg by mouth 2 (two) times daily.   unknown  . senna (SENOKOT) 8.6 MG tablet Take 2 tablets by mouth at bedtime.   unknown  . sodium chloride (NASAL MOIST) 0.65 % nasal spray Place 2 sprays into the nose every 2 (two) hours as needed for congestion.   prn  . traZODone (DESYREL) 50 MG tablet Take 50 mg by mouth at bedtime.   unknown  . Verapamil HCl CR 300 MG CP24 Take 300 mg by mouth daily.   unknown   Assessment: Pharmacy consulted to dose meropenem for 75 yo female being treated for PNA. Patient previously on Levofloxacin as well.  Plan:  Will continue Merrem 1 g IV q8 hours, as CrCl >50  ml/min.  Pharmacy will continue to monitor for changes in renal function and adjust per consult.   Nancy Fetter, PharmD Pharmacy Resident 03/13/2015

## 2015-03-13 NOTE — Progress Notes (Signed)
Called [facility] to report pt is awake and fighting to keep bipap on, wants it off. Baseline mentation is not known, was not oriented x 4 during the day for staff. Respiratory therapist in room, and will give treatment and then place pt on [place] per MD request and leave off for a few hours to see how she does, and return to bipap if sats drop again. May also consider low dose haldol if mask needs to be replaced. Also informed MD that patient was hypothermic and had to have a warming blanket placed.

## 2015-03-13 NOTE — Progress Notes (Signed)
[facility] [place] NAME: Julie Anthony    MR#:  [id number]  DATE OF BIRTH:  02/18/1940  SUBJECTIVE:  CHIEF COMPLAINT:   Chief Complaint  Patient presents with  . Dysphagia  . Cough   the patient is a 75 year old Caucasian female with chronic respiratory failure on 2 L of oxygen through nasal cannula at home, who lives alone presents to the hospital with complaints of cough, chest congestion as well as shortness of breath.  Patient is currently off BiPAP. According to medical staff patient is on and off BiPAP for shortness of breath. As per patient's discussion with critical care patient doesn't want to be resuscitated and her CODE STATUS was changed to DO NOT RESUSCITATE   .  Review of Systems  Unable to perform ROS: mental acuity  Constitutional: Negative for fever, chills and diaphoresis.  HENT: Negative for ear pain.   Respiratory: Positive for shortness of breath.        Shortness of breath with minimal exertion  Cardiovascular: Negative for chest pain and palpitations.  Skin: Negative for itching and rash.  Neurological: Negative for tremors, seizures and headaches.  Psychiatric/Behavioral: The patient is not nervous/anxious and does not have insomnia.    on BiPAP  VITAL SIGNS: Blood pressure 162/88, pulse 101, temperature 95.7 F (35.4 C), temperature source Rectal, resp. rate 21, height 5\' 5"  (1.651 m), weight 77.3 kg (170 lb 6.7 oz), SpO2 93 %.  PHYSICAL EXAMINATION:   GENERAL:  75 y.o.-year-old patient slumped in the bed in mild respiratory distress. Unresponsive to light shaking EYES: Pupils equal, round, reactive to light and accommodation. No scleral icterus. Extraocular muscles intact.  HEENT: Head atraumatic, normocephalic. Oropharynx and nasopharynx clear.  NECK:  Supple, no jugular venous distention. No thyroid enlargement, no tenderness.  LUNGS:Diminished breath sounds bilaterally, coarse  f you have question  is ew rhonchi  were noted bilaterally . Kyphosis, slumped in bed  CARDIOVASCULAR: S1, S2 , bradycardic, regular. No murmurs, rubs, or gallops.  ABDOMEN: Soft, nontender, nondistended. Bowel sounds present. No organomegaly or mass.  EXTREMITIES: No pedal edema, cyanosis, or clubbing.  NEUROLOGIC: Cranial nerves difficult to evaluate this patient is poorly responsive. Muscle strength , unable to evaluate. Sensation unable to examine. Gait not checked.  PSYCHIATRIC: The patient is unresponsive  SKIN: No obvious rash, lesion, or ulcer.   ORDERS/RESULTS REVIEWED:   CBC  Recent Labs Lab 03/07/15 0904 03/08/15 0446 03/09/15 0434 03/10/15 0826  WBC 8.3 7.0  --   --   HGB 12.3 12.3  --   --   HCT 37.8 38.4  --   --   PLT 145* 123* 131* 148*  MCV 94.9 95.9  --   --   MCH 30.9 30.7  --   --   MCHC 32.6 32.0  --   --   RDW 15.4* 15.3*  --   --    ------------------------------------------------------------------------------------------------------------------  Chemistries   Recent Labs Lab 03/06/15 2006 03/07/15 0904 03/08/15 0446 03/08/15 1939 03/10/15 0826 03/11/15 0436  NA 135 141 142 140  --   --   K 3.4* 4.2 4.2 4.0  --   --   CL 97* 100* 102 101  --   --   CO2 32 34* 35* 36*  --   --   GLUCOSE 132* 129* 122* 176*  --   --   BUN 50* 47* 39* 27*  --   --   CREATININE 1.14*  0.85 0.77 0.65  --  0.55  CALCIUM 7.4* 8.1* 8.1* 7.9*  --   --   AST 728*  --  464* 272* 123*  --   ALT 1105*  --  1002* 763* 541*  --   ALKPHOS 118  --  121 101 99  --   BILITOT 0.6  --  0.6 0.4 0.3  --    ------------------------------------------------------------------------------------------------------------------ estimated creatinine clearance is 61.5 mL/min (by C-G formula based on Cr of 0.55). ------------------------------------------------------------------------------------------------------------------ No results for input(s): TSH, T4TOTAL, T3FREE, THYROIDAB in the last 72  hours.  Invalid input(s): FREET3  Cardiac Enzymes No results for input(s): CKMB, TROPONINI, MYOGLOBIN in the last 168 hours.  Invalid input(s): CK ------------------------------------------------------------------------------------------------------------------ Invalid input(s): POCBNP ---------------------------------------------------------------------------------------------------------------  RADIOLOGY: No results found.  EKG:  Orders placed or performed during the hospital encounter of 02/23/2015  . ED EKG  . ED EKG    ASSESSMENT AND PLAN:  Principal Problem:   NSTEMI (non-ST elevated myocardial infarction) ([place]) Active Problems:   HCAP (healthcare-associated pneumonia)   Abnormal LFTs   ARF (acute renal failure) ([place]) 1. Acute on chronic respiratory failure with hypoxia and hypercapnia due to acute exacerbation of COPD, left lower lobe pneumonia, concerning for aspiration pneumonia, worsening due to continuous regurgitations Patient is  on and off BiPAP as needed for shortness of breath  Patient's CODE STATUS has been changed to DO NOT RESUSCITATE by intensivist appreciate pulmonologist input  Continue  antibiotics Follow-up sputum cultures if collected , Patient would benefit from barium swallowing or EGD, however, not feasible at this time due to pneumonia  2. Multifocal pneumonia  continue meropenem , sputum cultures are pending.   Blood cultures were negative. Continue Humibid,tapering  IV steroids and Pulmicort,    when necessary BiPAP,  Pending palliative care consult Currently patient is nothing by mouth for possible aspiration Speech therapy-reconsult for reassessment, as per RNs report patient is tolerating thickened liquids at this time without any aspiration  3. Acute renal failure, resolved with IV fluid administration, unremarkable urinalysis, stable with lisinopril initiation  4. Leukocytosis, resolved  5. Non-Q-wave MI, type 2, due to demand ischemia,  per cardiologist, continue aspirin, heparin , nitrates, atenolol, no intervention was recommended  6. Elevated transaminases , likely due to statin, improved holding statin. Ultrasound of abdomen revealed a normal common bile duct and unremarkable liver. Following LFTs intermittently  7. Regurgitation, likely related to kyphosis, slow gastrointestinal passage, unremarkable KUB , on low rate IV fluids   Management plans discussed with the patient, palliative care consult is pending Could not provide any kind of nutrition via NG tube or orogastric tube as patient is  on BiPAP, high risk for intubation   DRUG ALLERGIES:  Allergies  Allergen Reactions  . Penicillins Rash  . Codeine Other (See Comments)    Unknown reaction.  . Ceftin [Cefuroxime Axetil] Hives    Unknown reaction  . Erythromycin Other (See Comments)    Unknown reaction  . Ibuprofen Other (See Comments)    Unknown reaction  . Sulfa Antibiotics Other (See Comments)    Unknown reaction    CODE STATUS:     Code Status Orders        Start     Ordered   03/01/2015 1930  Full code   Continuous     02/24/2015 1929    Code Status History    Date Active Date Inactive Code Status Order ID Comments User Context   This patient has a current code status  but no historical code status.      TOTAL  CRITICAL CARE TIME TAKING CARE OF THIS PATIENT: 40 minutes.    Nicholes Mango M.D on 03/13/2015 at 12:49 PM  Between 7am to 6pm - Pager - [phone]  After 6pm go to [email or web address] - password [email or web address] [facility]  Office  [phone]  CC: Primary care physician; [place]

## 2015-03-13 NOTE — Progress Notes (Signed)
Pt requested to wear Bipap at 0650. Bipap reapplied. Pt asleep at this time, easily awakens. Was up since midnight calling out different requests to staff passing by her room. Blood pressure high around 0500. Had one prn dose of hydralazine that brought BP back down. Continue to monitor since pt was very unstable earlier in the shift.

## 2015-03-13 NOTE — Progress Notes (Signed)
Patient awake and able to answer questions appropriately. Dr. Ashby Dawes asked her the year, month, where she was and why. He asked if she wants to be intubated or receive chest compressions if necessary and she said no. He asked her twice more and she answered said no both times. Medications given with thickened liquids and she did not cough or gurgle after.

## 2015-03-13 NOTE — Progress Notes (Signed)
Pt is awake and complaining about Bipap. Elink notified by Rn and ok to place on [place] for a while. Pt is getting breathing treatment and will then go to [facility]

## 2015-03-13 NOTE — Progress Notes (Signed)
Pt unable to tolerate CPT

## 2015-03-13 NOTE — Progress Notes (Signed)
Pt is still unable to tolerate CPT

## 2015-03-13 NOTE — Progress Notes (Addendum)
PULMONARY / CRITICAL CARE MEDICINE   Name: Julie Anthony MRN: [id number] DOB: [birth year]    ADMISSION DATE:  02/23/2015   CC follow up resp failure  HPI re-admitted to ICU for increased WOB, unresponsive On bIPAP, high risk for intubation  STUDIES:  1/23 CT Chest - Left upper and left lower lobe pulmonary opacities, suspicious for infection or aspiration. Fluid in the left endobronchial tree could relate to mucous plugging or aspiration. Enlarged PA.  SIGNIFICANT EVENTS: -severe resp acidosis, transferred to ICU placed on Bipap fio2 at 50%, remains lethargic  VITAL SIGNS: Temp:  [94.5 F (34.7 C)-98.4 F (36.9 C)] 95.7 F (35.4 C) (01/29 1400) Pulse Rate:  [62-105] 95 (01/29 1400) Resp:  [16-27] 24 (01/29 1400) BP: (66-166)/(45-89) 153/75 mmHg (01/29 1400) SpO2:  [84 %-95 %] 93 % (01/29 1400) FiO2 (%):  [50 %] 50 % (01/29 0700) Weight:  [170 lb 6.7 oz (77.3 kg)] 170 lb 6.7 oz (77.3 kg) (01/29 0442) HEMODYNAMICS:   VENTILATOR SETTINGS: Vent Mode:  [-]  FiO2 (%):  [50 %] 50 % INTAKE / OUTPUT:  Intake/Output Summary (Last 24 hours) at 03/13/15 1559 Last data filed at 03/13/15 1400  Gross per 24 hour  Intake   1350 ml  Output    200 ml  Net   1150 ml    Review of Systems  Unable to perform ROS: critical illness  Respiratory: Positive for shortness of breath and wheezing.     Physical Exam  Constitutional: She appears distressed.  HENT:  Head: Normocephalic and atraumatic.  Eyes: Pupils are equal, round, and reactive to light.  Neck: Normal range of motion. Neck supple.  Cardiovascular: Normal rate, regular rhythm and normal heart sounds.   No murmur heard. Pulmonary/Chest: She is in respiratory distress. She has wheezes. She has rales.  Abdominal: Soft. Bowel sounds are normal.  Musculoskeletal: Normal range of motion.  Neurological:  lethargic  Skin: Skin is warm. She is diaphoretic.  Nursing note and vitals reviewed.    LABS:  CBC  Recent  Labs Lab 03/07/15 0904 03/08/15 0446 03/09/15 0434 03/10/15 0826  WBC 8.3 7.0  --   --   HGB 12.3 12.3  --   --   HCT 37.8 38.4  --   --   PLT 145* 123* 131* 148*   Coag's No results for input(s): APTT, INR in the last 168 hours. BMET  Recent Labs Lab 03/07/15 0904 03/08/15 0446 03/08/15 1939 03/11/15 0436  NA 141 142 140  --   K 4.2 4.2 4.0  --   CL 100* 102 101  --   CO2 34* 35* 36*  --   BUN 47* 39* 27*  --   CREATININE 0.85 0.77 0.65 0.55  GLUCOSE 129* 122* 176*  --    Electrolytes  Recent Labs Lab 03/07/15 0904 03/08/15 0446 03/08/15 1939  CALCIUM 8.1* 8.1* 7.9*   Sepsis Markers No results for input(s): LATICACIDVEN, PROCALCITON, O2SATVEN in the last 168 hours. ABG  Recent Labs Lab 03/08/15 1245 03/11/15 1003 03/12/15 2033  PHART 7.36 7.33* 7.30*  PCO2ART 68* 85* 83*  PO2ART 95 53* 59*   Liver Enzymes  Recent Labs Lab 03/08/15 0446 03/08/15 1939 03/10/15 0826  AST 464* 272* 123*  ALT 1002* 763* 541*  ALKPHOS 121 101 99  BILITOT 0.6 0.4 0.3  ALBUMIN 3.1* 3.0* 3.2*   Cardiac Enzymes No results for input(s): TROPONINI, PROBNP in the last 168 hours. Glucose  Recent Labs Lab 03/08/15 0848 03/09/15  0805 03/09/15 1204 03/10/15 0720 03/11/15 1117 03/12/15 0757  GLUCAP 118* 104* 146* 81 116* 114*    Imaging No results found.  LINES:   CULTURES: Bld Cx x 2 1/21 >>negative to date MRSA >>negative Sputum Cx 1/22>>  ANTIBIOTICS Levaquin 1/21>>1/24 Meropenem 1/24>> Vanc 1/21>>1/24 Aztreonam 1/21>>1/24  ASSESSMENT / PLAN: 75 yo female with PMHx of COPD on 2L, Dementia, CAD, admitted for NSTEMI and AECOPD.  Noted to have LLL opacity, worsening respiratory distress on biPAP  1.AECOPD-biPAP as needed/tolerated - cont with steroids and nebs - check ABG as needed - f/u sputum cultures -high risk for intubation   2.LLL opacity c/w pneumonia with mucus pluggs -CT chest review  -continue abx   3.Multifocal Pneumonia - plan  as stated above - antibiotics as stated above  4.NSTEMI - CE if needed - cont with asa, heparin, nitrates, BP control   Deep Keah Lamba M.D.  Critical Care Attestation.  I have personally obtained a history, examined the patient, evaluated laboratory and imaging results, formulated the assessment and plan and placed orders. The Patient requires high complexity decision making for assessment and support, frequent evaluation and titration of therapies, application of advanced monitoring technologies and extensive interpretation of multiple databases. The patient has critical illness that could lead imminently to failure of 1 or more organ systems and requires the highest level of physician preparedness to intervene.  Critical Care Time devoted to patient care services described in this note is 35 minutes and is exclusive of time spent in procedures.   Addendum: At the time of my evaluation above, the patient was evaluated for mental status, she was able to answer and confirm that she was alert and oriented 3, she responded appropriately to questions. I did ask the patient, in the presence of the critical care nurse, whether she wanted to be put on life support. She responded with "no". We asked the patient whether she would like to be allowed to pass away if she got sicker and required life support. She responded with "yes". Subsequently, I repeated and received the same responses, confirming that the patient would not want life support, and she would want to be allowed to pass away if she got sicker. Subsequently, the patient was made DO NOT RESUSCITATE.

## 2015-03-14 ENCOUNTER — Inpatient Hospital Stay: Payer: Medicare (Managed Care)

## 2015-03-14 DIAGNOSIS — Z01818 Encounter for other preprocedural examination: Secondary | ICD-10-CM | POA: Insufficient documentation

## 2015-03-14 DIAGNOSIS — J9690 Respiratory failure, unspecified, unspecified whether with hypoxia or hypercapnia: Secondary | ICD-10-CM | POA: Insufficient documentation

## 2015-03-14 DIAGNOSIS — J189 Pneumonia, unspecified organism: Secondary | ICD-10-CM | POA: Insufficient documentation

## 2015-03-14 DIAGNOSIS — J181 Lobar pneumonia, unspecified organism: Secondary | ICD-10-CM

## 2015-03-14 DIAGNOSIS — J9622 Acute and chronic respiratory failure with hypercapnia: Secondary | ICD-10-CM

## 2015-03-14 LAB — COMPREHENSIVE METABOLIC PANEL
ALBUMIN: 2.7 g/dL — AB (ref 3.5–5.0)
ALT: 180 U/L — AB (ref 14–54)
AST: 33 U/L (ref 15–41)
Alkaline Phosphatase: 80 U/L (ref 38–126)
Anion gap: 5 (ref 5–15)
BUN: 29 mg/dL — AB (ref 6–20)
CHLORIDE: 106 mmol/L (ref 101–111)
CO2: 37 mmol/L — AB (ref 22–32)
CREATININE: 0.94 mg/dL (ref 0.44–1.00)
Calcium: 8.3 mg/dL — ABNORMAL LOW (ref 8.9–10.3)
GFR calc Af Amer: >60 mL/min (ref >60)
GFR, EST NON AFRICAN AMERICAN: 57 mL/min — AB (ref >60)
GLUCOSE: 96 mg/dL (ref 65–99)
POTASSIUM: 4.6 mmol/L (ref 3.5–5.1)
Sodium: 148 mmol/L — ABNORMAL HIGH (ref 135–145)
Total Bilirubin: 0.6 mg/dL (ref 0.3–1.2)
Total Protein: 5.4 g/dL — ABNORMAL LOW (ref 6.5–8.1)

## 2015-03-14 LAB — BLOOD GAS, ARTERIAL
ALLENS TEST (PASS/FAIL): POSITIVE — AB
ALLENS TEST (PASS/FAIL): POSITIVE — AB
Acid-Base Excess: 7 mmol/L — ABNORMAL HIGH (ref 0.0–3.0)
Acid-Base Excess: 11.2 mmol/L — ABNORMAL HIGH (ref 0.0–3.0)
BICARBONATE: 39 meq/L — AB (ref 21.0–28.0)
Bicarbonate: 35.9 mEq/L — ABNORMAL HIGH (ref 21.0–28.0)
FIO2: 0.42
FIO2: 0.5
MECHANICAL RATE: 20
O2 SAT: 80.8 %
O2 SAT: 80.3 %
PATIENT TEMPERATURE: 37
PEEP/CPAP: 5 cmH2O
PO2 ART: 56 mmHg — AB (ref 83.0–108.0)
Patient temperature: 37
VT: 450 mL
pCO2 arterial: 102 mmHg (ref 32.0–48.0)
pCO2 arterial: 46 mmHg (ref 32.0–48.0)
pH, Arterial: 7.19 — CL (ref 7.350–7.450)
pH, Arterial: 7.5 — ABNORMAL HIGH (ref 7.350–7.450)
pO2, Arterial: 40 mmHg — CL (ref 83.0–108.0)

## 2015-03-14 LAB — CBC
HCT: 36 % (ref 35.0–47.0)
Hemoglobin: 11.5 g/dL — ABNORMAL LOW (ref 12.0–16.0)
MCH: 31.6 pg (ref 26.0–34.0)
MCHC: 32.1 g/dL (ref 32.0–36.0)
MCV: 98.6 fL (ref 80.0–100.0)
PLATELETS: 120 10*3/uL — AB (ref 150–440)
RBC: 3.65 MIL/uL — AB (ref 3.80–5.20)
RDW: 16.3 % — AB (ref 11.5–14.5)
WBC: 14.1 10*3/uL — AB (ref 3.6–11.0)

## 2015-03-14 LAB — GLUCOSE, CAPILLARY: GLUCOSE-CAPILLARY: 104 mg/dL — AB (ref 65–99)

## 2015-03-14 MED ORDER — ENOXAPARIN SODIUM 40 MG/0.4ML ~~LOC~~ SOLN
40.0000 mg | SUBCUTANEOUS | Status: DC
  Administered 2015-03-14 – 2015-03-24 (×11): 40 mg via SUBCUTANEOUS
  Filled 2015-03-14 (×10): qty 0.4

## 2015-03-14 MED ORDER — MIDAZOLAM HCL 2 MG/2ML IJ SOLN
INTRAMUSCULAR | Status: AC
  Administered 2015-03-14: 2 mg
  Filled 2015-03-14: qty 4

## 2015-03-14 MED ORDER — MIDAZOLAM HCL 2 MG/2ML IJ SOLN
1.0000 mg | INTRAMUSCULAR | Status: DC | PRN
  Administered 2015-03-15 (×3): 1 mg via INTRAVENOUS
  Filled 2015-03-14 (×2): qty 2

## 2015-03-14 MED ORDER — FENTANYL CITRATE (PF) 100 MCG/2ML IJ SOLN
50.0000 ug | INTRAMUSCULAR | Status: AC | PRN
Start: 2015-03-14 — End: 2015-03-14
  Administered 2015-03-14 (×3): 50 ug via INTRAVENOUS
  Filled 2015-03-14 (×2): qty 2

## 2015-03-14 MED ORDER — CHLORHEXIDINE GLUCONATE 0.12% ORAL RINSE (MEDLINE KIT)
15.0000 mL | Freq: Two times a day (BID) | OROMUCOSAL | Status: DC
  Administered 2015-03-15 – 2015-03-18 (×6): 15 mL via OROMUCOSAL
  Filled 2015-03-14 (×10): qty 15

## 2015-03-14 MED ORDER — ANTISEPTIC ORAL RINSE SOLUTION (CORINZ)
7.0000 mL | Freq: Four times a day (QID) | OROMUCOSAL | Status: DC
  Administered 2015-03-15 – 2015-03-18 (×14): 7 mL via OROMUCOSAL
  Filled 2015-03-14 (×18): qty 7

## 2015-03-14 MED ORDER — PHENYTOIN 125 MG/5ML PO SUSP
100.0000 mg | Freq: Three times a day (TID) | ORAL | Status: DC
  Administered 2015-03-14: 100 mg via ORAL
  Filled 2015-03-14 (×4): qty 4

## 2015-03-14 MED ORDER — FENTANYL CITRATE (PF) 100 MCG/2ML IJ SOLN
INTRAMUSCULAR | Status: AC
  Administered 2015-03-14: 50 ug
  Filled 2015-03-14: qty 4

## 2015-03-14 MED ORDER — FENTANYL CITRATE (PF) 100 MCG/2ML IJ SOLN
50.0000 ug | INTRAMUSCULAR | Status: DC | PRN
  Administered 2015-03-15 (×2): 50 ug via INTRAVENOUS
  Filled 2015-03-14 (×2): qty 2

## 2015-03-14 MED ORDER — VECURONIUM BROMIDE 10 MG IV SOLR
10.0000 mg | Freq: Once | INTRAVENOUS | Status: AC
  Administered 2015-03-14: 10 mg via INTRAVENOUS

## 2015-03-14 MED ORDER — FENTANYL CITRATE (PF) 100 MCG/2ML IJ SOLN
100.0000 ug | Freq: Once | INTRAMUSCULAR | Status: AC
  Administered 2015-03-14: 100 ug via INTRAVENOUS

## 2015-03-14 MED ORDER — MIDAZOLAM HCL 2 MG/2ML IJ SOLN
1.0000 mg | INTRAMUSCULAR | Status: AC | PRN
  Administered 2015-03-14 – 2015-03-15 (×3): 1 mg via INTRAVENOUS
  Filled 2015-03-14 (×2): qty 2

## 2015-03-14 MED ORDER — MIDAZOLAM HCL 2 MG/2ML IJ SOLN
1.0000 mg | Freq: Once | INTRAMUSCULAR | Status: AC
  Administered 2015-03-14: 1 mg via INTRAVENOUS

## 2015-03-14 NOTE — Care Management (Signed)
Spoke with Dr Mortimer Fries and [facility] physician Dr Rito Ehrlich.  Dr Royanne Foots says that patient does have a hcpoa - Julie Anthony.  There is no hcpoa documents on the chart or in epic.  Dr Royanne Foots will have [facility] fax the documents they have in their record.  Dr Royanne Foots stated that patient was made it known to care team that she would want to be "treated."  Dr Royanne Foots stated that it is planned that patient will transfer to [facility] at discharge and have bipap ordered.  [facility] would need the settings.  CM discussed palliative care follow patient at [facility] and she stated that "[facility] is patient's palliative care."  At present, informed there is not a need for hospice to also be involved.  CM has placed call and left message for Julie Anthony to discuss disposition.  It is felt that this patient will not be abe to return home unless she has 24 hour round the clock caregiver support as [facility] is not able to provide overnight coverage.

## 2015-03-14 NOTE — Progress Notes (Signed)
Update - patient with worsening respiratory status, ABG with severe respiratory acidosis. Patient also more lethargic, and agitated at times. Health care power of attorney reverse code to full code.  Patient intubated urgently given severe hypercapnia, lethargy, worsening clinical respiratory status.  Critical care time-35 minutes  Vilinda Boehringer, MD [facility] Pager [phone] (please enter 7-digits) On Call Pager - [phone] (please enter 7-digits)

## 2015-03-14 NOTE — Care Management (Addendum)
Received HCPOA documents and placed in chart.  Ms Julie Anthony is noted to be hcpoa along with Julie Anthony.  Julie Anthony does not know who this person may be.  The phone number on the HCPOA form is  disconnected.   Patient made DNR 1/29 by Dr Juanell Fairly after he and primary nurse spoke with patient.   Informed that patient was been alert, oriented at time of the discussion and felt she was competent to make that decision at the time.  It is reported that patient communicated to attending and primary nurse that she would want to be a DNR.  Ms Julie Anthony says that patient should be a full code and would want to be full code. She says that she has documents "proving this" and will bring the to the hospital 1/31.   Today, she does not seem as mentally clear and says that she would want to be intubated.  Discussed possible ethics consult.  Julie Anthony is also concerned about feedings and does indicate patient would want tube feedings.  Patient code status will be changed to full code.

## 2015-03-14 NOTE — Care Management (Signed)
Patient transferred back to icu 1/27 from 1A due to need for continuous bipapp.  Patient is now DNR.  It is anticipated patient will transfer out of icu today.  will update [facility]

## 2015-03-14 NOTE — Progress Notes (Signed)
[place] NOTE  Pharmacy Consult for Electrolyte and Phenytoin Monitoring      Allergies  Allergen Reactions  . Penicillins Rash  . Codeine Other (See Comments)    Unknown reaction.  . Ceftin [Cefuroxime Axetil] Hives    Unknown reaction  . Erythromycin Other (See Comments)    Unknown reaction  . Ibuprofen Other (See Comments)    Unknown reaction  . Sulfa Antibiotics Other (See Comments)    Unknown reaction    Patient Measurements: Height: 5\' 5"  (165.1 cm) Weight: 175 lb 7.8 oz (79.6 kg) IBW/kg (Calculated) : 57  Vital Signs: Temp: 97.3 F (36.3 C) (01/30 0830) Temp Source: Rectal (01/30 0800) BP: 106/59 mmHg (01/30 0800) Pulse Rate: 76 (01/30 0830) Intake/Output from previous day: 01/29 0701 - 01/30 0700 In: 1400 [I.V.:1200; IV Piggyback:200] Out: -  Intake/Output from this shift: Total I/O In: 50 [I.V.:50] Out: 75 [Urine:75] Vent settings for last 24 hours: Vent Mode:  [-]  FiO2 (%):  [50 %-70 %] 70 %  Labs:  Recent Labs  03/14/15 0459  WBC 14.1*  HGB 11.5*  HCT 36.0  PLT 120*  CREATININE 0.94  ALBUMIN 2.7*  PROT 5.4*  AST 33  ALT 180*  ALKPHOS 80  BILITOT 0.6   Estimated Creatinine Clearance: 53 mL/min (by C-G formula based on Cr of 0.94).   Recent Labs  03/12/15 0757 03/14/15 0835  GLUCAP 114* 104*    Microbiology: Recent Results (from the past 720 hour(s))  Culture, blood (Routine X 2) w Reflex to ID Panel     Status: None   Collection Time: 02/14/2015  1:00 PM  Result Value Ref Range Status   Specimen Description BLOOD LEFT ANTECUBITAL  Final   Special Requests BOTTLES DRAWN AEROBIC AND ANAEROBIC  1CC  Final   Culture NO GROWTH 5 DAYS  Final   Report Status 03/10/2015 FINAL  Final  Culture, blood (Routine X 2) w Reflex to ID Panel     Status: None   Collection Time: 03/01/2015  1:04 PM  Result Value Ref Range Status   Specimen Description BLOOD LEFT HAND  Final   Special Requests BOTTLES DRAWN AEROBIC AND  ANAEROBIC  1CC  Final   Culture NO GROWTH 5 DAYS  Final   Report Status 03/10/2015 FINAL  Final  MRSA PCR Screening     Status: None   Collection Time: 03/07/15  5:32 PM  Result Value Ref Range Status   MRSA by PCR NEGATIVE NEGATIVE Final    Comment:        The GeneXpert MRSA Assay (FDA approved for NASAL specimens only), is one component of a comprehensive MRSA colonization surveillance program. It is not intended to diagnose MRSA infection nor to guide or monitor treatment for MRSA infections.   MRSA PCR Screening     Status: None   Collection Time: 03/11/15 11:20 AM  Result Value Ref Range Status   MRSA by PCR NEGATIVE NEGATIVE Final    Comment:        The GeneXpert MRSA Assay (FDA approved for NASAL specimens only), is one component of a comprehensive MRSA colonization surveillance program. It is not intended to diagnose MRSA infection nor to guide or monitor treatment for MRSA infections.     Medications:  Scheduled:  . acetylcysteine  3 mL Nebulization BID  . antiseptic oral rinse  7 mL Mouth Rinse q12n4p  . aspirin EC  81 mg Oral Daily  . budesonide (PULMICORT) nebulizer solution  0.5 mg Nebulization BID  . chlorhexidine  15 mL Mouth Rinse BID  . cholecalciferol  1,000 Units Oral Daily  . clopidogrel  75 mg Oral Daily  . docusate sodium  100 mg Oral BID  . DULoxetine  60 mg Oral Daily  . enoxaparin (LOVENOX) injection  40 mg Subcutaneous Q24H  . famotidine  20 mg Oral BID  . feeding supplement (ENSURE ENLIVE)  237 mL Oral BID BM  . guaiFENesin  600 mg Oral BID  . ipratropium-albuterol  3 mL Nebulization Q4H  . lactulose  30 g Oral Daily  . levothyroxine  175 mcg Oral QHS  . loratadine  10 mg Oral Daily  . meropenem (MERREM) IV  1 g Intravenous 3 times per day  . methylPREDNISolone (SOLU-MEDROL) injection  20 mg Intravenous Q12H  . metoCLOPramide  5 mg Oral TID AC & HS  . phenytoin  300 mg Oral QHS  . senna  2 tablet Oral QHS  . sodium chloride  3 mL  Intravenous Q12H  . traZODone  50 mg Oral QHS   Infusions:  . sodium chloride 50 mL/hr at 03/14/15 0800    Assessment: Pharmacy consulted to monitor electrolytes and phenytoin for [name]. Patient is currently on home dose of phenytoin 300mg  QHS.   Plan:  1. Phenytoin: Level obtained 1/23 was 11.9. Will recheck phenytoin and albumin with am labs.   2. Electrolytes: Electrolytes are within normal limits. Will recheck electrolytes with am labs.   Pharmacy will continue to monitor and adjust per consult.    Karla Pavone L 03/14/2015,1:25 PM

## 2015-03-14 NOTE — Progress Notes (Addendum)
Update: Patient with continued BiPAP use, today more confused, less alert. There is a healthcare power of attorney listed as Roena Malady. Health care power of attorney spoken to by caseworker and by physician, stated that patient has paperwork that she would want full measures to prolong life which may include mechanical ventilation, tracheostomy, PEG tube placement. -She is currently a DNR/DNI -Healthcare power of attorney will bring appropriate paperwork in the morning but at this time she has verbally stated that patient is now a FULL CODE.    CODE STATUS - FULL CODE   Vilinda Boehringer, MD [facility] Pager [phone] (please enter 7-digits) On Call Pager - [phone] (please enter 7-digits)

## 2015-03-14 NOTE — Progress Notes (Signed)
PULMONARY / CRITICAL CARE MEDICINE   Name: Julie Anthony MRN: [id number] DOB: [birth year]    ADMISSION DATE:  02/28/2015   CC follow up resp failure-slowly improving  HPI On biPAP PRN Patient now DNR/DNI Awake and alert this AM Will plan to transfer to gen med floor with PRN biPAP  STUDIES:  1/23 CT Chest - Left upper and left lower lobe pulmonary opacities, suspicious for infection or aspiration. Fluid in the left endobronchial tree could relate to mucous plugging or aspiration. Enlarged PA.  SIGNIFICANT EVENTS: 1/24 - lethargy, severe resp acidosis, transferred to ICU placed on [place]: Temp:  [93.6 F (34.2 C)-98.2 F (36.8 C)] 96.8 F (36 C) (01/30 0700) Pulse Rate:  [58-105] 75 (01/30 0700) Resp:  [18-27] 21 (01/30 0700) BP: (59-162)/(40-88) 90/55 mmHg (01/30 0700) SpO2:  [88 %-98 %] 91 % (01/30 0700) FiO2 (%):  [50 %-70 %] 70 % (01/30 0500) Weight:  [175 lb 7.8 oz (79.6 kg)] 175 lb 7.8 oz (79.6 kg) (01/30 0500) HEMODYNAMICS:   VENTILATOR SETTINGS: Vent Mode:  [-]  FiO2 (%):  [50 %-70 %] 70 % INTAKE / OUTPUT:  Intake/Output Summary (Last 24 hours) at 03/14/15 0758 Last data filed at 03/14/15 0700  Gross per 24 hour  Intake   1400 ml  Output      0 ml  Net   1400 ml    Review of Systems  Unable to perform ROS: medical condition  Respiratory: Negative for shortness of breath and wheezing.   Cardiovascular: Negative for chest pain.    Physical Exam  Constitutional: No distress.  HENT:  Head: Normocephalic and atraumatic.  Eyes: Pupils are equal, round, and reactive to light.  Neck: Normal range of motion. Neck supple.  Cardiovascular: Normal rate, regular rhythm and normal heart sounds.   No murmur heard. Pulmonary/Chest: No respiratory distress. She has no wheezes. She has rales.  Abdominal: Soft. Bowel sounds are normal.  Musculoskeletal: Normal range of motion.  Neurological: She is alert.  Skin: Skin is warm. She is not diaphoretic.   Nursing note and vitals reviewed.    LABS:  CBC  Recent Labs Lab 03/07/15 0904 03/08/15 0446 03/09/15 0434 03/10/15 0826 03/14/15 0459  WBC 8.3 7.0  --   --  14.1*  HGB 12.3 12.3  --   --  11.5*  HCT 37.8 38.4  --   --  36.0  PLT 145* 123* 131* 148* 120*   Coag's No results for input(s): APTT, INR in the last 168 hours. BMET  Recent Labs Lab 03/08/15 0446 03/08/15 1939 03/11/15 0436 03/14/15 0459  NA 142 140  --  148*  K 4.2 4.0  --  4.6  CL 102 101  --  106  CO2 35* 36*  --  37*  BUN 39* 27*  --  29*  CREATININE 0.77 0.65 0.55 0.94  GLUCOSE 122* 176*  --  96   Electrolytes  Recent Labs Lab 03/08/15 0446 03/08/15 1939 03/14/15 0459  CALCIUM 8.1* 7.9* 8.3*   Sepsis Markers No results for input(s): LATICACIDVEN, PROCALCITON, O2SATVEN in the last 168 hours. ABG  Recent Labs Lab 03/08/15 1245 03/11/15 1003 03/12/15 2033  PHART 7.36 7.33* 7.30*  PCO2ART 68* 85* 83*  PO2ART 95 53* 59*   Liver Enzymes  Recent Labs Lab 03/08/15 1939 03/10/15 0826 03/14/15 0459  AST 272* 123* 33  ALT 763* 541* 180*  ALKPHOS 101 99 80  BILITOT 0.4 0.3 0.6  ALBUMIN 3.0* 3.2*  2.7*   Cardiac Enzymes No results for input(s): TROPONINI, PROBNP in the last 168 hours. Glucose  Recent Labs Lab 03/08/15 0848 03/09/15 0805 03/09/15 1204 03/10/15 0720 03/11/15 1117 03/12/15 0757  GLUCAP 118* 104* 146* 81 116* 114*    Imaging No results found.  LINES:   CULTURES: Bld Cx x 2 1/21 >>negative to date MRSA >>negative Sputum Cx 1/22>>  ANTIBIOTICS Levaquin 1/21>>1/24 Meropenem 1/24>> Vanc 1/21>>1/24 Aztreonam 1/21>>1/24  ASSESSMENT / PLAN: 75 yo female with PMHx of COPD on 2L, Dementia, CAD, admitted for NSTEMI and AECOPD.   Noted to have LLL opacity,resp distress responding to biPAP   1.AECOPD-biPAP as needed/tolerated - cont with steroids and nebs - f/u sputum cultures   2.LLL opacity c/w pneemonia with mucus pluggs - CT chest  reviewed -continue abx   3.Multifocal Pneumonia - plan as stated above - antibiotics as stated above  4.NSTEMI - CE if needed - cont with asa, heparin, nitrates, BP control   OK to transfer to gen med floor with PRN biPAP when asleep and at night  Overall, prognosis is guarded.  Palliative care team consulted, patient is DNR/DNI  Corrin Parker, M.D.  [facility]  Medical Director [place] Director [facility] Cardio-Pulmonary Department

## 2015-03-14 NOTE — Clinical Social Work Note (Signed)
Patient's pasrr has gone for a level 2 evaluation. Shela Leff MSW,LCSW [phone]

## 2015-03-14 NOTE — Progress Notes (Signed)
eLink Physician-Brief Progress Note Patient Name: Julie Anthony DOB: 10-24-1940 MRN: [id number]   Date of Service  03/14/2015  HPI/Events of Note  resp alkalosis  eICU Interventions  Lower RR 16     Intervention Category Major Interventions: Acid-Base disturbance - evaluation and management  [name] V. 03/14/2015, 9:50 PM

## 2015-03-14 NOTE — Progress Notes (Signed)
Nutrition Follow-up    INTERVENTION:   Medical Food Supplement Therapy: will change nutritional supplements to [employer], Magic Cup due to change in consistency Meals and Snacks: Cater to patient preferences  NUTRITION DIAGNOSIS:   Inadequate oral intake related to inability to eat as evidenced by NPO status. Being addressed as diet advanced, supplements  GOAL:   Patient will meet greater than or equal to 90% of their needs  MONITOR:    (Energy Intake, Anthropometrics, Pulmonary Profile, Digestive System)  REASON FOR ASSESSMENT:   LOS    ASSESSMENT:   Respiratory failure slowly improving, Bipap prn, alert, passed SLP eval today for diet advancement.   Diet Order:  DIET - DYS 1 Room service appropriate?: Yes with Assist; Fluid consistency:: Nectar Thick   Energy Intake: diet just readvanced; po intake previously 81% on average (per documentation up until 03/10/15)  Electrolyte and Renal Profile:  Recent Labs Lab 03/08/15 0446 03/08/15 1939 03/11/15 0436 03/14/15 0459  BUN 39* 27*  --  29*  CREATININE 0.77 0.65 0.55 0.94  NA 142 140  --  148*  K 4.2 4.0  --  4.6   Glucose Profile:  Recent Labs  03/12/15 0757 03/14/15 0835  GLUCAP 114* 104*   Meds: NS at 50 ml/hr, solumedrol, reglan  Height:   Ht Readings from Last 1 Encounters:  03/07/2015 5\' 5"  (1.651 m)    Weight:   Wt Readings from Last 1 Encounters:  03/14/15 175 lb 7.8 oz (79.6 kg)   BMI:  Body mass index is 29.2 kg/(m^2).  Estimated Nutritional Needs:   Kcal:  BEE: 1245kcals, TEE: (IF 1.1-1.3)(AF 1.2) 1646-1945kcals  Protein:  83-98g protein (1.1-1.3g/kg)  Fluid:  1888-2267mL of fluid (25-71mL/kg)  EDUCATION NEEDS:   Education needs no appropriate at this time  [place], [place], LDN [phone] Pager  [phone] Weekend/On-Call Pager

## 2015-03-14 NOTE — Progress Notes (Signed)
Patient intubated by Dr. Stevenson Clinch.

## 2015-03-14 NOTE — Progress Notes (Signed)
Clinical Education officer, museum (CSW) received call from [employer]. CSW made [facility] aware that patient is in ICU. Per Arthor Captain should not have went to level 2 screen and she is going to attempt to cancel it out. Per Colletta [place] if she cannot cancel it out she will have to visit patient.  Blima Rich, LCSW [phone]

## 2015-03-14 NOTE — Progress Notes (Signed)
Speech Language Pathology Treatment: Dysphagia  Patient Details Name: Julie Anthony MRN: [id number] DOB: 1941-01-19 Today's Date: 03/14/2015 Time: 1330-1430 SLP Time Calculation (min) (ACUTE ONLY): 60 min  Assessment / Plan / Recommendation Clinical Impression  Pt appears at increased risk for aspiration d/t declined medical and respiratory status' at this time. Pt also exhibits and endorses s/s of Esophageal dysmotility c/b moderate+, consistent belching. W/ strict aspiration precautions and monitoring of bolus size/amount (and to avoid multiple sip drinking), pt appeared to tolerate sips of thin liquids ("cold water", her request) w/ no immediate s/s of aspiration noted - no coughing, no decline in O2 sats/respirations during trials. After moderate belching, pt exhibited coughing x1 - unsure if related but not immediate to the swallow. Pt appeared to tolerate trials of Nectar liquids as well w/ no overt s/s of aspiration noted; similar presentation w/ puree trials(few). Due to pt's overall presentation including belching and SOB w/ any exertion, rec. A Dys. 1 diet w/ Nectar liquids; meds in puree; strict aspiration precautions and feeding assistance at all meals. ST will f/u w/ toleration of diet next 1-2 days. NSG updated. Precautions posted. Of note, strongly rec. Discussion at discharge of [place] sips of cold water (per pt request) following strict aspiration precautions if ok by MD/family/pt and when appropriate.   HPI HPI: Pt is a 75 y.o. female has a past medical history significant for CAD, Dementia, COPD/asthma, OA and breast cancer w/ radiation who presents to ER with persistent cough, congestion, and SOB. Was being treated by her PCP for pneumonia with Septra DS. In ER, pt found to have persistent LLL pneumonia with ARF and abnormal LFT's. Also noted to have troponin=5.5 c/w NSTEMI. She denies CP. Pt was on a dys. 3 w/ thin liquids at admission. Previously, pt has been swallowing pills w/  liquids w/ NSG who denied any overt s/s of aspiration. She could feed herself at meals w/ setup and min. assist. Pt was transferred to CCU w/ CO2 elevated and was placed on BiPAP. Pt weaned from BiPAP now and tolerating [place] and transferred to the regular floor. NSG did not indicated overt s/s of aspiration w/ po meal/meds but regurgitation has been reported by [facility] staff - a moderate amount of liquid and food was regurgitated post lunch meal per report. Pt was placed on a Dys. 2 diet w/ thin liquids w/ meds in puree w/ NSG after initial eval but had a decline in O2 status and required transfer to CCU again and placed on BiPAP. Pt is now off BiPAP; less toleration of thin liquids w/ NSG. Pt has endorsed belching/burping and stated the food "just had to come back up" when asked about the regurgitation and N/V and belching at home. Pt is currently taking meds in puree w/ NSG doing well w/ that.       SLP Plan  Continue with current plan of care     Recommendations  Diet recommendations: Dysphagia 1 (puree);Nectar-thick liquid Liquids provided via: Cup;Straw Medication Administration: Crushed with puree Supervision: Staff to assist with self feeding;Full supervision/cueing for compensatory strategies Compensations: Minimize environmental distractions;Slow rate;Small sips/bites (rest breaks; single bites and sips) Postural Changes and/or Swallow Maneuvers: Seated upright 90 degrees;Upright 30-60 min after meal (GERD precautions)             General recommendations:  (palliative care consult) Oral Care Recommendations: Oral care BID;Staff/trained caregiver to provide oral care Follow up Recommendations: Skilled Nursing facility (TBD) Plan: Continue with current plan of care  Hoffman, MS, CCC-SLP  Watson,Katherine 03/14/2015, 3:42 PM

## 2015-03-14 NOTE — Procedures (Signed)
Procedure Note:  Orotracheal Intubation  Implied consent due to emergent nature of patient's condition. Correct Patient, Name & ID confirmed.  The patient was pre-oxygenated and then, under direct visualization, a 7.5 mm cuffed endotracheal tube was placed through the vocal cords into the trachea, using the Glidescope.  Total attempts made 1, using Glidescope During intubation an assistant applied gentle pressure to the cricoid cartilage.  Position confirmed by auscultation of lungs (good breath sounds bilaterally) and no stomach sounds.  Tube secured at 23 cm. Pulse ox 94 %. CO2 detector in place with appropriate color change.   Pt tolerated procedure well.  No complications were noted.   CXR ordered.   Vilinda Boehringer, MD [facility] Pager [phone] On Call Pager [phone]

## 2015-03-14 NOTE — Progress Notes (Signed)
[facility] [place] NAME: Julie Anthony    MR#:  [id number]  DATE OF BIRTH:  [birth year]  SUBJECTIVE:  CHIEF COMPLAINT:   Chief Complaint  Patient presents with  . Dysphagia  . Cough   the patient is a 75 year old Caucasian female with chronic respiratory failure on 2 L of oxygen through nasal cannula at home, who lives alone presents to the hospital with complaints of cough, chest congestion as well as shortness of breath.  Patient is currently on BiPAP. According to medical staff patient is on and off BiPAP for shortness of breath. As per patient's discussion with critical care patient doesn't want to be resuscitated and her CODE STATUS was changed to DO NOT RESUSCITATE  , still nothing by mouth .  Review of Systems  Unable to perform ROS: mental acuity  Constitutional: Negative for fever, chills and diaphoresis.  HENT: Negative for ear pain.   Respiratory: Positive for shortness of breath.        Shortness of breath with minimal exertion  Cardiovascular: Negative for chest pain and palpitations.  Skin: Negative for itching and rash.  Neurological: Negative for tremors, seizures and headaches.  Psychiatric/Behavioral: The patient is not nervous/anxious and does not have insomnia.    on BiPAP  VITAL SIGNS: Blood pressure 106/59, pulse 76, temperature 97.3 F (36.3 C), temperature source Rectal, resp. rate 25, height 5\' 5"  (1.651 m), weight 79.6 kg (175 lb 7.8 oz), SpO2 92 %.  PHYSICAL EXAMINATION:   GENERAL:  75 y.o.-year-old patient slumped in the bed in mild respiratory distress. Unresponsive to light shaking EYES: Pupils equal, round, reactive to light and accommodation. No scleral icterus. Extraocular muscles intact.  HEENT: Head atraumatic, normocephalic. Oropharynx and nasopharynx clear.  NECK:  Supple, no jugular venous distention. No thyroid enlargement, no tenderness.  LUNGS:Diminished breath sounds bilaterally, coarse   f you have question is ew rhonchi  were noted bilaterally . Kyphosis, slumped in bed  CARDIOVASCULAR: S1, S2 , bradycardic, regular. No murmurs, rubs, or gallops.  ABDOMEN: Soft, nontender, nondistended. Bowel sounds present. No organomegaly or mass.  EXTREMITIES: No pedal edema, cyanosis, or clubbing.  NEUROLOGIC: Awake and alert and oriented 3, motor is grossly intact. Sensory is intact PSYCHIATRIC: The patient is awake and alert and oriented 3 SKIN: No obvious rash, lesion, or ulcer.   ORDERS/RESULTS REVIEWED:   CBC  Recent Labs Lab 03/08/15 0446 03/09/15 0434 03/10/15 0826 03/14/15 0459  WBC 7.0  --   --  14.1*  HGB 12.3  --   --  11.5*  HCT 38.4  --   --  36.0  PLT 123* 131* 148* 120*  MCV 95.9  --   --  98.6  MCH 30.7  --   --  31.6  MCHC 32.0  --   --  32.1  RDW 15.3*  --   --  16.3*   ------------------------------------------------------------------------------------------------------------------  Chemistries   Recent Labs Lab 03/08/15 0446 03/08/15 1939 03/10/15 0826 03/11/15 0436 03/14/15 0459  NA 142 140  --   --  148*  K 4.2 4.0  --   --  4.6  CL 102 101  --   --  106  CO2 35* 36*  --   --  37*  GLUCOSE 122* 176*  --   --  96  BUN 39* 27*  --   --  29*  CREATININE 0.77 0.65  --  0.55 0.94  CALCIUM 8.1* 7.9*  --   --  8.3*  AST 464* 272* 123*  --  33  ALT 1002* 763* 541*  --  180*  ALKPHOS 121 101 99  --  80  BILITOT 0.6 0.4 0.3  --  0.6   ------------------------------------------------------------------------------------------------------------------ estimated creatinine clearance is 53 mL/min (by C-G formula based on Cr of 0.94). ------------------------------------------------------------------------------------------------------------------ No results for input(s): TSH, T4TOTAL, T3FREE, THYROIDAB in the last 72 hours.  Invalid input(s): FREET3  Cardiac Enzymes No results for input(s): CKMB, TROPONINI, MYOGLOBIN in the last 168  hours.  Invalid input(s): CK ------------------------------------------------------------------------------------------------------------------ Invalid input(s): POCBNP ---------------------------------------------------------------------------------------------------------------  RADIOLOGY: No results found.  EKG:  Orders placed or performed during the hospital encounter of 03/14/2015  . ED EKG  . ED EKG    ASSESSMENT AND PLAN:  Principal Problem:   NSTEMI (non-ST elevated myocardial infarction) ([place]) Active Problems:   HCAP (healthcare-associated pneumonia)   Abnormal LFTs   ARF (acute renal failure) ([place])  1. Acute on chronic respiratory failure with hypoxia and hypercapnia due to acute exacerbation of COPD, left lower lobe pneumonia, concerning for aspiration pneumonia, worsening due to continuous regurgitations Patient is  on and off BiPAP as needed for shortness of breath  Patient's CODE STATUS has been changed to DO NOT RESUSCITATE by intensivist appreciate pulmonologist input  Continue  antibiotics Follow-up sputum cultures if collected , Patient would benefit from barium swallowing or EGD, however, not feasible at this time due to pneumonia  2. Multifocal pneumonia, probably aspiration pneumonia from recurrent regurgitation  continue meropenem  Blood cultures were negative. Continue Humibid,tapering  IV steroids and Pulmicort,    when necessary BiPAP,  Pending palliative care consult Currently patient is nothing by mouth for possible aspiration Speech therapy- as per RNs report patient is tolerating thickened liquids at this time without any aspiration. Discussed with speech therapy. Their concern is that patient is regurgitating a lot which would precipitate aspiration  3. Acute renal failure, resolved with IV fluid administration, unremarkable urinalysis, stable with lisinopril initiation  4. Leukocytosis, resolved  5. Non-Q-wave MI, type 2, due to demand ischemia,  per cardiologist, continue aspirin, heparin given , nitrates, atenolol, no intervention was recommended  6. Elevated transaminases , likely due to statin, improved holding statin. Ultrasound of abdomen revealed a normal common bile duct and unremarkable liver. Following LFTs intermittently  7. Regurgitation, likely related to kyphosis, slow gastrointestinal passage, unremarkable KUB , on low rate IV fluids   Management plans discussed with the patient, palliative care consult is pending Could not provide any kind of nutrition via NG tube or orogastric tube as patient is  on BiPAP Disposition-probably inpatient pace program after palliative care evaluation    DRUG ALLERGIES:  Allergies  Allergen Reactions  . Penicillins Rash  . Codeine Other (See Comments)    Unknown reaction.  . Ceftin [Cefuroxime Axetil] Hives    Unknown reaction  . Erythromycin Other (See Comments)    Unknown reaction  . Ibuprofen Other (See Comments)    Unknown reaction  . Sulfa Antibiotics Other (See Comments)    Unknown reaction    CODE STATUS:     Code Status Orders        Start     Ordered   03/07/2015 1930   DO NOT RESUSCITATE   Continuous     03/08/2015 1929    Code Status History    Date Active Date Inactive Code Status Order ID Comments User Context   This patient has a current code status but no historical code status.  TOTAL  CRITICAL CARE TIME TAKING CARE OF THIS PATIENT: 40 minutes.    Nicholes Mango M.D on 03/14/2015 at 2:00 PM  Between 7am to 6pm - Pager - [phone]  After 6pm go to [email or web address] - password [email or web address] [facility]  Office  [phone]  CC: Primary care physician; [place]

## 2015-03-15 ENCOUNTER — Inpatient Hospital Stay: Payer: Medicare (Managed Care)

## 2015-03-15 DIAGNOSIS — Z9981 Dependence on supplemental oxygen: Secondary | ICD-10-CM

## 2015-03-15 DIAGNOSIS — Z9989 Dependence on other enabling machines and devices: Secondary | ICD-10-CM

## 2015-03-15 DIAGNOSIS — D649 Anemia, unspecified: Secondary | ICD-10-CM

## 2015-03-15 DIAGNOSIS — Z515 Encounter for palliative care: Secondary | ICD-10-CM

## 2015-03-15 DIAGNOSIS — I2510 Atherosclerotic heart disease of native coronary artery without angina pectoris: Secondary | ICD-10-CM

## 2015-03-15 DIAGNOSIS — J45909 Unspecified asthma, uncomplicated: Secondary | ICD-10-CM

## 2015-03-15 DIAGNOSIS — R0600 Dyspnea, unspecified: Secondary | ICD-10-CM

## 2015-03-15 LAB — BASIC METABOLIC PANEL
ANION GAP: 8 (ref 5–15)
BUN: 33 mg/dL — ABNORMAL HIGH (ref 6–20)
CALCIUM: 7.9 mg/dL — AB (ref 8.9–10.3)
CO2: 31 mmol/L (ref 22–32)
CREATININE: 0.85 mg/dL (ref 0.44–1.00)
Chloride: 105 mmol/L (ref 101–111)
Glucose, Bld: 83 mg/dL (ref 65–99)
POTASSIUM: 3.7 mmol/L (ref 3.5–5.1)
Sodium: 144 mmol/L (ref 135–145)

## 2015-03-15 LAB — BLOOD GAS, ARTERIAL
ACID-BASE EXCESS: 8.8 mmol/L — AB (ref 0.0–3.0)
Allens test (pass/fail): POSITIVE — AB
Bicarbonate: 35.3 mEq/L — ABNORMAL HIGH (ref 21.0–28.0)
DELIVERY SYSTEMS: POSITIVE
Expiratory PAP: 6
FIO2: 0.6
INSPIRATORY PAP: 16
O2 Saturation: 97.6 %
PATIENT TEMPERATURE: 37
PCO2 ART: 57 mmHg — AB (ref 32.0–48.0)
PO2 ART: 98 mmHg (ref 83.0–108.0)
pH, Arterial: 7.4 (ref 7.350–7.450)

## 2015-03-15 LAB — CBC
HCT: 35.3 % (ref 35.0–47.0)
HEMOGLOBIN: 11.4 g/dL — AB (ref 12.0–16.0)
MCH: 30.5 pg (ref 26.0–34.0)
MCHC: 32.4 g/dL (ref 32.0–36.0)
MCV: 94.1 fL (ref 80.0–100.0)
PLATELETS: 138 10*3/uL — AB (ref 150–440)
RBC: 3.75 MIL/uL — AB (ref 3.80–5.20)
RDW: 15 % — ABNORMAL HIGH (ref 11.5–14.5)
WBC: 14.4 10*3/uL — AB (ref 3.6–11.0)

## 2015-03-15 LAB — TRIGLYCERIDES: TRIGLYCERIDES: 153 mg/dL — AB (ref <150)

## 2015-03-15 LAB — ALBUMIN: Albumin: 2.4 g/dL — ABNORMAL LOW (ref 3.5–5.0)

## 2015-03-15 LAB — PHENYTOIN LEVEL, TOTAL: PHENYTOIN LVL: 5.1 ug/mL — AB (ref 10.0–20.0)

## 2015-03-15 MED ORDER — PHENYTOIN SODIUM 50 MG/ML IJ SOLN
100.0000 mg | Freq: Three times a day (TID) | INTRAMUSCULAR | Status: DC
Start: 2015-03-15 — End: 2015-03-24
  Administered 2015-03-15 – 2015-03-24 (×28): 100 mg via INTRAVENOUS
  Filled 2015-03-15 (×31): qty 2

## 2015-03-15 MED ORDER — FENTANYL CITRATE (PF) 100 MCG/2ML IJ SOLN
INTRAMUSCULAR | Status: AC
  Filled 2015-03-15: qty 2

## 2015-03-15 MED ORDER — FUROSEMIDE 10 MG/ML IJ SOLN
20.0000 mg | Freq: Once | INTRAMUSCULAR | Status: AC
Start: 2015-03-15 — End: 2015-03-15
  Administered 2015-03-15: 20 mg via INTRAVENOUS
  Filled 2015-03-15: qty 2

## 2015-03-15 MED ORDER — MIDAZOLAM HCL 2 MG/2ML IJ SOLN
INTRAMUSCULAR | Status: AC
  Filled 2015-03-15: qty 4

## 2015-03-15 MED ORDER — FREE WATER
100.0000 mL | Freq: Three times a day (TID) | Status: DC
  Administered 2015-03-15 – 2015-03-16 (×2): 100 mL

## 2015-03-15 MED ORDER — FENTANYL CITRATE (PF) 100 MCG/2ML IJ SOLN
100.0000 ug | Freq: Once | INTRAMUSCULAR | Status: AC
  Administered 2015-03-15: 100 ug via INTRAVENOUS

## 2015-03-15 MED ORDER — NOREPINEPHRINE BITARTRATE 1 MG/ML IV SOLN
0.0000 ug/min | INTRAVENOUS | Status: DC
  Filled 2015-03-15: qty 16

## 2015-03-15 MED ORDER — MIDAZOLAM HCL 2 MG/2ML IJ SOLN
4.0000 mg | Freq: Once | INTRAMUSCULAR | Status: AC
  Administered 2015-03-15: 4 mg via INTRAVENOUS

## 2015-03-15 MED ORDER — FAMOTIDINE IN NACL 20-0.9 MG/50ML-% IV SOLN
20.0000 mg | Freq: Two times a day (BID) | INTRAVENOUS | Status: DC
  Administered 2015-03-15 – 2015-03-24 (×19): 20 mg via INTRAVENOUS
  Filled 2015-03-15 (×20): qty 50

## 2015-03-15 MED ORDER — PROPOFOL 1000 MG/100ML IV EMUL: 5.0000 ug/kg/min | INTRAVENOUS | Status: DC

## 2015-03-15 MED ORDER — VITAL HIGH PROTEIN PO LIQD: 1000.0000 mL | ORAL | Status: DC

## 2015-03-15 MED ORDER — DEXMEDETOMIDINE HCL IN NACL 400 MCG/100ML IV SOLN
0.4000 ug/kg/h | INTRAVENOUS | Status: DC
  Administered 2015-03-15: 0.5 ug/kg/h via INTRAVENOUS
  Administered 2015-03-15: 1 ug/kg/h via INTRAVENOUS
  Administered 2015-03-15: 0.7 ug/kg/h via INTRAVENOUS
  Administered 2015-03-16 – 2015-03-17 (×2): 0.4 ug/kg/h via INTRAVENOUS
  Filled 2015-03-15 (×6): qty 100

## 2015-03-15 MED ORDER — MIDAZOLAM HCL 2 MG/2ML IJ SOLN
INTRAMUSCULAR | Status: AC
  Administered 2015-03-15: 4 mg via INTRAVENOUS
  Filled 2015-03-15: qty 4

## 2015-03-15 NOTE — Consult Note (Signed)
Palliative Medicine Inpatient Consult Note   Name: Julie Anthony Date: 03/15/2015 MRN: [id number]  DOB: Jun 15, 1940  Referring Physician: Nicholes Mango, MD  Palliative Care consult requested for this 75 y.o. female for goals of medical therapy in patient with acute on chronic respiratory failure.  She had been getting Septra DS to treat her pneumonia as an outpt.  She was found to have persistent LLL pneumonia with ARF ( CR 2.26) and abnormal LFTs.  AST was 1143 with ALT 1249.   She also had elevated troponin's c/w a NSTEMI (5.5).  She had no chest pain. She had been sent to the ER by Home health and since she was cared for by Home health, was diagnosed with HCAP/ Aspiration Pneumonia.      TODAY'S DISCUSSIONS AND DECISIONS: 1.  Pts code status was reversed last pm when MOST form was located indicating she wants full aggressive care.  I spoke with the pts pastor (as he is very involved in her life and is a family surrogate) and he is inclined to want aggressive care for a week or two and then if this is not working, he and his wife will consider cutting back on aggressive care. They don't believe that she would want to live with a trache and a PEG in a long term hospital etc.  But they do think she would want to have initial aggressive care.    2.  I am due to meet with HCPOA at 3pm --arranged by [facility] doctor   3.  Dr Alphonsus Sias reports that when pt was alert and able to express her wishes this past Sunday. At that time, she was alert and made her wishes known to Dr. Alphonsus Sias. She said she would prefer to pass away naturally and she requested DNR.  This conversation post-dated the MOST form signature.  Today, pt is saying she wants to live but tearing off the mask saying she ill not wear it.  She had to be sedated and restrained in order to keep the BIPAP on.   4.  I am going to see another pt as HCPOA individual has not yet arrived.  Will submit and update after I do meet with HCPOA.     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 COPD/ asthma Bilateral Pleural Effusions Elevated LFTs ---GI consult obtained (AST and ALT both >1k ---now near or at nl) ---Deemed to be 'shock liver' likely from ischemia / hypoxia. 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   ----she could prepare some of her own meals and was 'pretty functional' Mild Cardiomyopathy with EF 50% Leukocytosis Anemia --unclear etiology Mild Thrombocytopenia   REVIEW OF SYSTEMS:  Patient is not able to provide ROS due to being intubated and  sedated.   SPIRITUAL SUPPORT SYSTEM: Yes--Pts pastor and his wife (the [place]).  SOCIAL HISTORY:  reports that she quit smoking about 5 years ago. She does not have any smokeless tobacco history on file. Pt reportedly did not have a dementia ---screening MMS exam last showed a score of 26.  With [facility], pt was getting 'home health' mostly for home cleaning and some meal prep etc --they use Home Health differently per Dr. Gus Puma ([facility] physician who is informant for these details.   LEGAL DOCUMENTS:  Pt has a MOST form which I have seen (I saw the original and the copy that staff made for the paper chart)   --Original should stay with pt normally. The MOST form lists pt as wanting FULL CODE status, FULL scope of treatment, Antibiotics if needed, IV fluids long term if needed, and a feeding tube long term if needed.  This was completed on Mar 09, 2010.    Pt has a HCPOA form listing Darlina Rumpf at [phone] ext [phone] (a nonlisted # and no one seems to know who this is --I tried an IT consultant to no avail.      And secondary HCPOA is listed as Ellouise Newer at [phone].  Alicia's husband is Ron Astronomer.  He is pts minister.  Apparently, he and his wife have taken pt groceries and pay her utility bills for her and are checking on her daily or so when she is at home. Mariana Kaufman said pt's family was all 'cognitively impaired' with several MR family members. She reportedly has two brothers but no one knows where they are and he has not seen them visit or be in contact with pt.    CODE STATUS: Full code  PAST MEDICAL HISTORY: Past Medical History  Diagnosis Date  . Cancer [facility])     right breast cancer  . S/P radiation therapy     right breast cancer    PAST SURGICAL HISTORY:  Past Surgical History  Procedure Laterality Date  . Abdominal hysterectomy    . Breast excisional biopsy Right     positive 2006  . Breast biopsy Left     negative 02/2012 stereotactic    ALLERGIES:  is allergic to penicillins; codeine; ceftin; erythromycin; ibuprofen; and sulfa antibiotics.  MEDICATIONS:  Current Facility-Administered Medications  Medication Dose Route Frequency Provider Last Rate Last Dose  . 0.9 %  sodium chloride infusion   Intravenous Continuous Theodoro Grist, MD 50 mL/hr at 03/15/15 1337    . acetylcysteine (MUCOMYST) 20 % nebulizer / oral solution 3 mL  3 mL Nebulization BID Laverle Hobby, MD   3 mL at 03/15/15 0850  . antiseptic oral rinse (CPC / CETYLPYRIDINIUM CHLORIDE 0.05%) solution 7 mL  7 mL Mouth Rinse q12n4p Theodoro Grist, MD   7 mL at 03/14/15 1600  . antiseptic  oral rinse solution (CORINZ)  7 mL Mouth Rinse QID Nicholes Mango, MD   7 mL at 03/15/15 0453  . aspirin EC tablet 81 mg  81 mg Oral Daily Idelle Crouch, MD   81 mg at 03/13/15 [id number]  . bisacodyl (DULCOLAX) suppository 10 mg  10 mg Rectal Daily PRN Idelle Crouch, MD      . budesonide (PULMICORT) nebulizer solution 0.5 mg  0.5 mg Nebulization BID Flora Lipps, MD   0.5 mg at 03/15/15 0850  . calcium carbonate (TUMS - dosed in mg elemental calcium) chewable tablet 1,000 mg  1,000 mg Oral Q2H PRN Idelle Crouch, MD  1,000 mg at 03/06/15 2143  . chlorhexidine (PERIDEX) 0.12 % solution 15 mL  15 mL Mouth Rinse BID Theodoro Grist, MD   15 mL at 03/14/15 2200  . chlorhexidine gluconate (PERIDEX) 0.12 % solution 15 mL  15 mL Mouth Rinse BID Nicholes Mango, MD   0 mL at 03/14/15 2115  . cholecalciferol (VITAMIN D) tablet 1,000 Units  1,000 Units Oral Daily Idelle Crouch, MD   1,000 Units at 03/13/15 [id number]  . clopidogrel (PLAVIX) tablet 75 mg  75 mg Oral Daily Dionisio David, MD   75 mg at 03/13/15 0939  . dexmedetomidine (PRECEDEX) 400 MCG/100ML (4 mcg/mL) infusion  0.4-1.2 mcg/kg/hr Intravenous Titrated Laverle Hobby, MD 13.9 mL/hr at 03/15/15 1337 0.7 mcg/kg/hr at 03/15/15 1337  . docusate sodium (COLACE) capsule 100 mg  100 mg Oral BID Idelle Crouch, MD   100 mg at 03/14/15 2217  . DULoxetine (CYMBALTA) DR capsule 60 mg  60 mg Oral Daily Idelle Crouch, MD   60 mg at 03/13/15 [id number]  . enoxaparin (LOVENOX) injection 40 mg  40 mg Subcutaneous Q24H Flora Lipps, MD   40 mg at 03/14/15 1232  . famotidine (PEPCID) IVPB 20 mg premix  20 mg Intravenous Q12H Laverle Hobby, MD      . fentaNYL (SUBLIMAZE) injection 50 mcg  50 mcg Intravenous Q2H PRN Vilinda Boehringer, MD   50 mcg at 03/15/15 0802  . hydrALAZINE (APRESOLINE) injection 20 mg  20 mg Intravenous Q4H PRN Flora Lipps, MD   20 mg at 03/13/15 0515  . ipratropium-albuterol (DUONEB) 0.5-2.5 (3) MG/3ML nebulizer solution 3 mL  3 mL Nebulization  Q4H Flora Lipps, MD   3 mL at 03/15/15 1223  . lactulose (CHRONULAC) 10 GM/15ML solution 30 g  30 g Oral Daily Theodoro Grist, MD   30 g at 03/11/15 0802  . levothyroxine (SYNTHROID, LEVOTHROID) tablet 175 mcg  175 mcg Oral QHS Idelle Crouch, MD   175 mcg at 03/14/15 2217  . loratadine (CLARITIN) tablet 10 mg  10 mg Oral Daily Idelle Crouch, MD   10 mg at 03/13/15 0939  . meropenem (MERREM) 1 g in sodium chloride 0.9 % 100 mL IVPB  1 g Intravenous 3 times per day Laverle Hobby, MD   1 g at 03/15/15 0517  . methylPREDNISolone sodium succinate (SOLU-MEDROL) 40 mg/mL injection 20 mg  20 mg Intravenous Q12H Flora Lipps, MD   20 mg at 03/15/15 0204  . metoCLOPramide (REGLAN) tablet 5 mg  5 mg Oral TID AC & HS Theodoro Grist, MD   5 mg at 03/14/15 2217  . midazolam (VERSED) injection 1 mg  1 mg Intravenous Q2H PRN Vilinda Boehringer, MD   1 mg at 03/15/15 0929  . norepinephrine (LEVOPHED) 16 mg in dextrose 5 % 250 mL (0.064 mg/mL) infusion  0-40 mcg/min Intravenous Titrated Laverle Hobby, MD      . ondansetron (ZOFRAN) tablet 4 mg  4 mg Oral Q6H PRN Idelle Crouch, MD   4 mg at 03/06/15 1949   Or  . ondansetron (ZOFRAN) injection 4 mg  4 mg Intravenous Q6H PRN Idelle Crouch, MD      . phenytoin (DILANTIN) injection 100 mg  100 mg Intravenous 3 times per day Laverle Hobby, MD      . propofol (DIPRIVAN) 1000 MG/100ML infusion  5-80 mcg/kg/min Intravenous Titrated Laverle Hobby, MD      . senna (SENOKOT) tablet 17.2 mg  2 tablet Oral QHS Leonie Douglas  Sparks, MD   17.2 mg at 03/14/15 2230  . sodium chloride 0.9 % injection 3 mL  3 mL Intravenous Q12H Idelle Crouch, MD   3 mL at 03/14/15 2200  . traZODone (DESYREL) tablet 50 mg  50 mg Oral QHS Idelle Crouch, MD   50 mg at 03/14/15 2217    Vital Signs: BP 85/45 mmHg  Pulse 72  Temp(Src) 97.5 F (36.4 C) (Core (Comment))  Resp 17  Ht 5\' 5"  (1.651 m)  Wt 79.7 kg (175 lb 11.3 oz)  BMI 29.24 kg/m2  SpO2 100% Filed  Weights   03/13/15 0442 03/14/15 0500 03/15/15 0454  Weight: 77.3 kg (170 lb 6.7 oz) 79.6 kg (175 lb 7.8 oz) 79.7 kg (175 lb 11.3 oz)    Estimated body mass index is 29.24 kg/(m^2) as calculated from the following:   Height as of this encounter: 5\' 5"  (1.651 m).   Weight as of this encounter: 79.7 kg (175 lb 11.3 oz).  PERFORMANCE STATUS (ECOG) : 4 - Bedbound  PHYSICAL EXAM: She has been intubated  Eyes closed Some blood noted in OG No JVD or TM Hrt rrr with irreg beats Lungs with vent sounds and otw cta Abd soft and NT Skin w/o mottling or cyanosis  LABS: CBC:    Component Value Date/Time   WBC 14.4* 03/15/2015 0641   HGB 11.4* 03/15/2015 0641   HCT 35.3 03/15/2015 0641   PLT 138* 03/15/2015 0641   MCV 94.1 03/15/2015 0641   NEUTROABS 10.4* 02/23/2015 1300   LYMPHSABS 0.8* 03/09/2015 1300   MONOABS 0.9 02/24/2015 1300   EOSABS 0.0 03/06/2015 1300   BASOSABS 0.0 03/06/2015 1300   Comprehensive Metabolic Panel:    Component Value Date/Time   NA 144 03/15/2015 0641   K 3.7 03/15/2015 0641   CL 105 03/15/2015 0641   CO2 31 03/15/2015 0641   BUN 33* 03/15/2015 0641   CREATININE 0.85 03/15/2015 0641   GLUCOSE 83 03/15/2015 0641   CALCIUM 7.9* 03/15/2015 0641   AST 33 03/14/2015 0459   ALT 180* 03/14/2015 0459   ALKPHOS 80 03/14/2015 0459   BILITOT 0.6 03/14/2015 0459   PROT 5.4* 03/14/2015 0459   ALBUMIN 2.4* 03/15/2015 0641    ECHO 03/07/15: - Left ventricle: Systolic function was mildly reduced. The estimated ejection fraction was 50%. Regional wall motion abnormalities cannot be excluded. The study is not technically sufficient to allow evaluation of LV diastolic function. - Mitral valve: There was mild regurgitation. - Left atrium: The atrium was mildly dilated. - Right ventricle: The cavity size was mildly dilated. - Tricuspid valve: There was moderate regurgitation. - there is mild LV dysfunction with wall motion abnormality suggestive of  coronary artery disease moderate mitral regurgitation with mild mitral regurgitation.   CXR 03/15/15: Hypoinflation with persistent stable central airspace opacification suggesting interstitial edema. Evidence of bilateral pleural effusions without significant change. Cannot exclude infection over the mid to lower lungs.Right-sided PICC line which has been pulled back  and has tip over the SVC.  CT chest 03/07/15: 1. Mild to moderately motion degraded exam. 2. Left upper and left lower lobe pulmonary opacities, suspicious for infection or aspiration. Fluid in the left endobronchial tree could relate to mucous plugging or aspiration. 3. Cardiomegaly. Atherosclerosis, including within the coronary arteries. 4. Small bilateral pleural effusions. 5. Pulmonary artery enlargement suggests pulmonary arterial hypertension.   More than 50% of the visit was spent in counseling/coordination of care: Yes  Time Spent: 80 minutes

## 2015-03-15 NOTE — Progress Notes (Signed)
[facility] NOTE  Pharmacy Consult for Electrolyte and Phenytoin Monitoring      Allergies  Allergen Reactions  . Penicillins Rash  . Codeine Other (See Comments)    Unknown reaction.  . Ceftin [Cefuroxime Axetil] Hives    Unknown reaction  . Erythromycin Other (See Comments)    Unknown reaction  . Ibuprofen Other (See Comments)    Unknown reaction  . Sulfa Antibiotics Other (See Comments)    Unknown reaction    Patient Measurements: Height: 5\' 5"  (165.1 cm) Weight: 175 lb 11.3 oz (79.7 kg) IBW/kg (Calculated) : 57  Vital Signs: Temp: 97.5 F (36.4 C) (01/31 1300) Temp Source: Core (Comment) (01/31 1100) BP: 85/45 mmHg (01/31 1300) Pulse Rate: 72 (01/31 1200) Intake/Output from previous day: 01/30 0701 - 01/31 0700 In: 1500 [I.V.:1200; IV Piggyback:300] Out: 1000 [Urine:1000] Intake/Output from this shift:   Vent settings for last 24 hours: Vent Mode:  [-] PRVC FiO2 (%):  [50 %-60 %] 60 % Set Rate:  [16 bmp-20 bmp] 16 bmp Vt Set:  [450 mL] 450 mL PEEP:  [5 cmH20] 5 cmH20 Plateau Pressure:  [20 cmH20] 20 cmH20  Labs:  Recent Labs  03/14/15 0459 03/15/15 0641  WBC 14.1* 14.4*  HGB 11.5* 11.4*  HCT 36.0 35.3  PLT 120* 138*  CREATININE 0.94 0.85  ALBUMIN 2.7* 2.4*  PROT 5.4*  --   AST 33  --   ALT 180*  --   ALKPHOS 80  --   BILITOT 0.6  --    Estimated Creatinine Clearance: 58.8 mL/min (by C-G formula based on Cr of 0.85).   Recent Labs  03/14/15 0835  GLUCAP 104*    Microbiology: Recent Results (from the past 720 hour(s))  Culture, blood (Routine X 2) w Reflex to ID Panel     Status: None   Collection Time: 03/12/2015  1:00 PM  Result Value Ref Range Status   Specimen Description BLOOD LEFT ANTECUBITAL  Final   Special Requests BOTTLES DRAWN AEROBIC AND ANAEROBIC  1CC  Final   Culture NO GROWTH 5 DAYS  Final   Report Status 03/10/2015 FINAL  Final  Culture, blood (Routine X 2) w Reflex to ID Panel     Status: None   Collection Time: 02/20/2015  1:04 PM  Result Value Ref Range Status   Specimen Description BLOOD LEFT HAND  Final   Special Requests BOTTLES DRAWN AEROBIC AND ANAEROBIC  1CC  Final   Culture NO GROWTH 5 DAYS  Final   Report Status 03/10/2015 FINAL  Final  MRSA PCR Screening     Status: None   Collection Time: 03/07/15  5:32 PM  Result Value Ref Range Status   MRSA by PCR NEGATIVE NEGATIVE Final    Comment:        The GeneXpert MRSA Assay (FDA approved for NASAL specimens only), is one component of a comprehensive MRSA colonization surveillance program. It is not intended to diagnose MRSA infection nor to guide or monitor treatment for MRSA infections.   MRSA PCR Screening     Status: None   Collection Time: 03/11/15 11:20 AM  Result Value Ref Range Status   MRSA by PCR NEGATIVE NEGATIVE Final    Comment:        The GeneXpert MRSA Assay (FDA approved for NASAL specimens only), is one component of a comprehensive MRSA colonization surveillance program. It is not intended to diagnose MRSA infection nor to guide or monitor treatment for MRSA infections.  Culture, respiratory (NON-Expectorated)     Status: None (Preliminary result)   Collection Time: 03/14/15  9:28 PM  Result Value Ref Range Status   Specimen Description TRACHEAL ASPIRATE  Final   Special Requests Normal  Final   Gram Stain MODERATE WBC SEEN NO ORGANISMS SEEN   Final   Culture NO GROWTH < 12 HOURS  Final   Report Status PENDING  Incomplete    Medications:  Scheduled:  . acetylcysteine  3 mL Nebulization BID  . antiseptic oral rinse  7 mL Mouth Rinse q12n4p  . antiseptic oral rinse  7 mL Mouth Rinse QID  . aspirin EC  81 mg Oral Daily  . budesonide (PULMICORT) nebulizer solution  0.5 mg Nebulization BID  . chlorhexidine  15 mL Mouth Rinse BID  . chlorhexidine gluconate  15 mL Mouth Rinse BID  . cholecalciferol  1,000 Units Oral Daily  . clopidogrel  75 mg Oral Daily  . docusate sodium  100 mg Oral  BID  . DULoxetine  60 mg Oral Daily  . enoxaparin (LOVENOX) injection  40 mg Subcutaneous Q24H  . famotidine (PEPCID) IV  20 mg Intravenous Q12H  . ipratropium-albuterol  3 mL Nebulization Q4H  . lactulose  30 g Oral Daily  . levothyroxine  175 mcg Oral QHS  . loratadine  10 mg Oral Daily  . meropenem (MERREM) IV  1 g Intravenous 3 times per day  . methylPREDNISolone (SOLU-MEDROL) injection  20 mg Intravenous Q12H  . metoCLOPramide  5 mg Oral TID AC & HS  . phenytoin (DILANTIN) IV  100 mg Intravenous 3 times per day  . senna  2 tablet Oral QHS  . sodium chloride  3 mL Intravenous Q12H  . traZODone  50 mg Oral QHS   Infusions:  . sodium chloride 50 mL/hr at 03/15/15 1337  . dexmedetomidine 0.7 mcg/kg/hr (03/15/15 1337)  . norepinephrine (LEVOPHED) Adult infusion    . propofol (DIPRIVAN) infusion      Assessment: Pharmacy consulted to monitor electrolytes and phenytoin for [name]. Patient is currently on home dose of phenytoin 300mg  QHS.   Plan:  1. Phenytoin: Level obtained 5.1, albumin 2.4, corrected phenytoin level 6.4. Phenytoin level is low, however patient has missed several doses of phenytoin due to inability to take PO. Will change phenytoin to 100 mg iv q 8 hours and check another level at steady-state.   2. Electrolytes: Electrolytes are within normal limits. Will recheck electrolytes with am labs.   Pharmacy will continue to monitor and adjust per consult.    Ulice Dash D 03/15/2015,2:07 PM

## 2015-03-15 NOTE — Progress Notes (Signed)
Pharmacy Antibiotic Follow-up Note  Julie Anthony is a 75 y.o. year-old female admitted on 02/22/2015.  The patient is currently on day 8 of meropenem for PNA.  Assessment/Plan: After discussion with Dr. Ashby Dawes, will discontinue meropenem after 10 days total of therapy.   Temp (24hrs), Avg:98.4 F (36.9 C), Min:96.8 F (36 C), Max:99.9 F (37.7 C)   Recent Labs Lab 03/14/15 0459 03/15/15 0641  WBC 14.1* 14.4*    Recent Labs Lab 03/08/15 1939 03/11/15 0436 03/14/15 0459 03/15/15 0641  CREATININE 0.65 0.55 0.94 0.85   Estimated Creatinine Clearance: 58.8 mL/min (by C-G formula based on Cr of 0.85).    Allergies  Allergen Reactions  . Penicillins Rash  . Codeine Other (See Comments)    Unknown reaction.  . Ceftin [Cefuroxime Axetil] Hives    Unknown reaction  . Erythromycin Other (See Comments)    Unknown reaction  . Ibuprofen Other (See Comments)    Unknown reaction  . Sulfa Antibiotics Other (See Comments)    Unknown reaction    Antimicrobials this admission: aztreonam 01/21 >> 01/24 vancomycin 01/21 >> 01/24 Levaquin 01/22 >> 01/25 Meropenem 01/24 >>  Levels/dose changes this admission:   Microbiology results: 01/21 BCx: negative x 2 01/23 MRSA PCR: negative 01/30 TA: NGTD  Thank you for allowing pharmacy to be a part of this patient's care.  Ulice Dash D PharmD 03/15/2015 2:13 PM

## 2015-03-15 NOTE — Progress Notes (Addendum)
[facility] [place] NAME: Julie Anthony    MR#:  [id number]  DATE OF BIRTH:  May 15, 1940  SUBJECTIVE:  CHIEF COMPLAINT:   Chief Complaint  Patient presents with  . Dysphagia  . Cough   the patient is a 75 year old Caucasian female with chronic respiratory failure on 2 L of oxygen through nasal cannula at home, who lives alone presents to the hospital with complaints of cough, chest congestion as well as shortness of breath.  Patient was intubated 3 times so far since last night and self extubated at this time she is on BiPAP  No family members at bedside .  ROS on BiPAP Could not perform review of systems  VITAL SIGNS: Blood pressure 99/72, pulse 56, temperature 97.3 F (36.3 C), temperature source Core (Comment), resp. rate 24, height 5\' 5"  (1.651 m), weight 79.7 kg (175 lb 11.3 oz), SpO2 81 %.  PHYSICAL EXAMINATION:   GENERAL:  75 y.o.-year-old patient slumped in the bed in mild respiratory distress. Unresponsive to light shaking EYES: Pupils unequal, 4 mm on left eye and 2 mm right eye ,reactive to light and accommodation. No scleral icterus.  HEENT: Head atraumatic, normocephalic. Oropharynx and nasopharynx clear. On BiPAP NECK:  Supple, no jugular venous distention. No thyroid enlargement, no tenderness.  LUNGS:Diminished breath sounds bilaterally, coarse  breath sounds bilaterally CARDIOVASCULAR: S1, S2 , bradycardic, regular. No murmurs, rubs, or gallops.  ABDOMEN: Soft, nontender, nondistended. Bowel sounds present. No organomegaly or mass.  EXTREMITIES: No pedal edema, cyanosis, or clubbing.  NEUROLOGIC: Awake and alert and oriented 3, motor is grossly intact. Sensory is intact PSYCHIATRIC: The patient is awake and alert and oriented 3 SKIN: No obvious rash, lesion, or ulcer.   ORDERS/RESULTS REVIEWED:   CBC  Recent Labs Lab 03/09/15 0434 03/10/15 0826 03/14/15 0459 03/15/15 0641  WBC  --   --  14.1* 14.4*   HGB  --   --  11.5* 11.4*  HCT  --   --  36.0 35.3  PLT 131* 148* 120* 138*  MCV  --   --  98.6 94.1  MCH  --   --  31.6 30.5  MCHC  --   --  32.1 32.4  RDW  --   --  16.3* 15.0*   ------------------------------------------------------------------------------------------------------------------  Chemistries   Recent Labs Lab 03/08/15 1939 03/10/15 0826 03/11/15 0436 03/14/15 0459 03/15/15 0641  NA 140  --   --  148* 144  K 4.0  --   --  4.6 3.7  CL 101  --   --  106 105  CO2 36*  --   --  37* 31  GLUCOSE 176*  --   --  96 83  BUN 27*  --   --  29* 33*  CREATININE 0.65  --  0.55 0.94 0.85  CALCIUM 7.9*  --   --  8.3* 7.9*  AST 272* 123*  --  33  --   ALT 763* 541*  --  180*  --   ALKPHOS 101 99  --  80  --   BILITOT 0.4 0.3  --  0.6  --    ------------------------------------------------------------------------------------------------------------------ estimated creatinine clearance is 58.8 mL/min (by C-G formula based on Cr of 0.85). ------------------------------------------------------------------------------------------------------------------ No results for input(s): TSH, T4TOTAL, T3FREE, THYROIDAB in the last 72 hours.  Invalid input(s): FREET3  Cardiac Enzymes No results for input(s): CKMB, TROPONINI, MYOGLOBIN in the last 168 hours.  Invalid input(s): CK ------------------------------------------------------------------------------------------------------------------  Invalid input(s): POCBNP ---------------------------------------------------------------------------------------------------------------  RADIOLOGY: Dg Chest 1 View  03/15/2015  CLINICAL DATA:  PICC line placement. EXAM: CHEST 1 VIEW COMPARISON:  03/15/2015 at 12:14 p.m. FINDINGS: Patient's right-sided PICC line has been pulled back and has tip over the SVC just below the level of the carina. Lungs are hypoinflated as patient is rotated to the left. There is continued moderate bilateral perihilar  opacification suggesting interstitial edema with bilateral pleural effusions unchanged. Cannot exclude infection in the mid to lower lungs. Stable cardiomegaly. Remainder of the exam is unchanged. IMPRESSION: Hypoinflation with persistent stable central airspace opacification suggesting interstitial edema. Evidence of bilateral pleural effusions without significant change. Cannot exclude infection over the mid to lower lungs. Right-sided PICC line which has been pulled back and has tip over the SVC. Electronically Signed   By: Marin Olp M.D.   On: 03/15/2015 13:24   Dg Chest 1 View  03/15/2015  CLINICAL DATA:  Intubation, endotracheal tube EXAM: CHEST 1 VIEW COMPARISON:  03/14/2015 FINDINGS: Endotracheal tube remains approximately 2 cm above the carina. Dense left lung consolidation has worsened since prior study. Continued patchy right lower lobe lobe opacity. Low lung volumes. Suspect small effusions. Mild cardiomegaly. IMPRESSION: Worsening consolidation throughout the left lung or concerning for pneumonia. Stable right basilar infiltrate. Small effusions. Electronically Signed   By: Rolm Baptise M.D.   On: 03/15/2015 07:48   Dg Chest 1 View  03/14/2015  CLINICAL DATA:  Status post intubation EXAM: CHEST 1 VIEW COMPARISON:  03/11/2015 FINDINGS: ET tube tip is 2 cm above the carina. The heart size is normal. There are bilateral pleural effusions identified. Left upper lobe perihilar consolidation and right lower lobe consolidation identified. When compared with the exam from 03/11/2015 there has been significantly improved aeration to the left lung. IMPRESSION: 1. Significantly improved aeration to the left lung. 2. Bilateral pleural effusions. Electronically Signed   By: Kerby Moors M.D.   On: 03/14/2015 19:13   Dg Abd 1 View  03/15/2015  CLINICAL DATA:  Assess orogastric tube positioning. EXAM: ABDOMEN - 1 VIEW COMPARISON:  Supine abdominal film of March 14, 2015 FINDINGS: The orogastric tube  tip projects at the junction of the middle and distal thirds of the stomach. The proximal port is not clearly demonstrated. There remain loops of mildly distended gas-filled bowel in the upper abdomen. The retrocardiac region on the left remains dense. IMPRESSION: The tip of the orogastric tube lies in the region of the junction of the middle and distal thirds of the stomach. Electronically Signed   By: David  Martinique M.D.   On: 03/15/2015 09:12   Dg Abd 1 View  03/14/2015  CLINICAL DATA:  OG tube placement at bedside. EXAM: Portable ABDOMEN - 1 VIEW COMPARISON:  03/10/2015. FINDINGS: Patient motion blurred the image. OG tube tip in the proximal body of the stomach. Bowel gas pattern unremarkable without evidence of obstruction or significant ileus. IMPRESSION: OG tube tip in the proximal body of the stomach. No acute abdominal abnormality. Electronically Signed   By: Evangeline Dakin M.D.   On: 03/14/2015 20:19   Dg Chest Port 1 View  03/15/2015  CLINICAL DATA:  PICC line placement EXAM: PORTABLE CHEST 1 VIEW COMPARISON:  03/15/2015 FINDINGS: Areas of consolidation at throughout the left lung and in the right lower lung. This is not significantly changed since prior study. Probable layering bilateral effusions. Cardiomegaly. Right PICC line has been placed with the tip in the lower right atrium approximately 8 cm deep  to the cavoatrial junction. Interval extubation. IMPRESSION: Right PICC line tip in the lower right atrium approximately 8 cm deep to the cavoatrial junction. Interval extubation. Continued bilateral airspace disease, left greater than right, not significantly changed. Small layering effusions. Electronically Signed   By: Rolm Baptise M.D.   On: 03/15/2015 12:32   Dg Chest Port 1 View  03/15/2015  CLINICAL DATA:  Intubation . EXAM: PORTABLE CHEST 1 VIEW COMPARISON:  03/15/2015 . FINDINGS: Endotracheal tube and NG tube in stable position. Persistent diffuse severe consolidation throughout the  left lung. Persistent right base pulmonary infiltrate with slight worsening. No pleural that small pleural effusions cannot be excluded. No pneumothorax. Stable cardiomegaly. Surgical clips right axilla. IMPRESSION: 1. Endotracheal tube and NG tube in stable position. 2. Persistent diffuse dense unchanged infiltrate throughout the left lung. 3. Progressive infiltrate right lung base. Small bilateral pleural effusion cannot be excluded. Electronically Signed   By: Marcello Moores  Register   On: 03/15/2015 09:10    EKG:  Orders placed or performed during the hospital encounter of 02/22/2015  . ED EKG  . ED EKG    ASSESSMENT AND PLAN:  Principal Problem:   NSTEMI (non-ST elevated myocardial infarction) ([facility]) Active Problems:   HCAP (healthcare-associated pneumonia)   Abnormal LFTs   ARF (acute renal failure) ([place])   Encounter for intubation   Left lower lobe pneumonia   Respiratory failure (HCC)   Dyspnea  1. Acute on chronic respiratory failure with hypoxia and hypercapnia due to acute exacerbation of COPD, left lower lobe pneumonia, concerning for aspiration pneumonia, worsening due to continuous regurgitations Patient is  on and off BiPAP as needed for shortness of breath /intubated 3 and self extubated 3 times since last night  Patient's CODE STATUS has been changed to full code last night  appreciate pulmonologist input  Continue  antibiotics Follow-up sputum cultures if collected , Patient would benefit from barium swallowing or EGD, however, not feasible at this time due t oher respiratory status   -Anisocoria-CT head is ordered which is pending at this time ,will put a neurology consult if needed  2. Multifocal pneumonia, probably aspiration pneumonia from recurrent regurgitation  continue meropenem  Blood cultures were negative. Continue Humibid,  IV steroids and Pulmicort. Appreciate  palliative care recommendations  Currently patient is nothing by mouth for possible  aspiration Speech therapy- as per RNs report patient is tolerating thickened liquids at this time without any aspiration. Discussed with speech therapy. Their concern is that patient is regurgitating a lot which would precipitate aspiration  3. Acute renal failure, resolved with IV fluid administration, unremarkable urinalysis, stable with lisinopril initiation  4. Leukocytosis, resolved  5. Non-Q-wave MI, type 2, due to demand ischemia, per cardiologist, continue aspirin, heparin given , nitrates, atenolol, no intervention was recommended  6. Elevated transaminases , likely due to statin, improved holding statin. Ultrasound of abdomen revealed a normal common bile duct and unremarkable liver. Following LFTs intermittently  7. Regurgitation, likely related to kyphosis, slow gastrointestinal passage, unremarkable KUB , on low rate IV fluids   Management plans discussed with the patient, RN Could not provide any kind of nutrition via NG tube or orogastric tube as patient is  on BiPAP Disposition-??    DRUG ALLERGIES:  Allergies  Allergen Reactions  . Penicillins Rash  . Codeine Other (See Comments)    Unknown reaction.  . Ceftin [Cefuroxime Axetil] Hives    Unknown reaction  . Erythromycin Other (See Comments)    Unknown reaction  .  Ibuprofen Other (See Comments)    Unknown reaction  . Sulfa Antibiotics Other (See Comments)    Unknown reaction    CODE STATUS:     Code Status Orders        Start     Ordered   02/25/2015 1930   DO NOT RESUSCITATE   Continuous     02/27/2015 1929    Code Status History    Date Active Date Inactive Code Status Order ID Comments User Context   This patient has a current code status but no historical code status.      TOTAL  CRITICAL CARE TIME TAKING CARE OF THIS PATIENT: 40 minutes.    Nicholes Mango M.D on 03/15/2015 at 3:56 PM  Between 7am to 6pm - Pager - [phone]  After 6pm go to [email or web address] - password [email or web address] [facility]  Office  [phone]  CC: Primary care physician; [place]

## 2015-03-15 NOTE — Progress Notes (Signed)
Pt noted to have a decrease HR to 55 sats dropped into 70's . Pt precedex stopped and oxygen increased to 100%. Pat sats now 95% . HR up into 80's sinus arrhythmia. . Pt was lethargic but now more alert and responsive . Will cont to monitor

## 2015-03-15 NOTE — Progress Notes (Signed)
PULMONARY / CRITICAL CARE MEDICINE   Name: Julie Anthony MRN: [id number] DOB: 1941-01-07    ADMISSION DATE:  03/15/2015   CC follow up resp failure    ASSESSMENT / PLAN: 75 yo female with PMHx of COPD on 2L, Dementia, CAD, admitted for NSTEMI and AECOPD.  Noted to have LLL opacity, worsening respiratory distress on biPAP  1.AECOPD - cont with steroids and nebs, currently on Solu-Medrol 20 mg IV every 12.  - f/u sputum cultures  Respiratory culture 03/14/2015: Negative. MRSA screen negative. Blood culture 02/26/2015: Negative.  Antibiotics: Meropenem 03/09/2015 >>  2.LLL opacity c/w pneumonia with mucus plugs. Continue antibiotics.  -. Chest x-ray from 03/15/2015 and images reviewed, shows continued left-sided pneumonia.  3.Multifocal Pneumonia - plan as stated above - antibiotics as stated above  4.NSTEMI - CE if needed - cont with asa, heparin, nitrates, BP control   STUDIES:  1/23 CT Chest - Left upper and left lower lobe pulmonary opacities, suspicious for infection or aspiration. Fluid in the left endobronchial tree could relate to mucous plugging or aspiration. Enlarged PA.  SIGNIFICANT EVENTS: Intubated 03/14/2015.   CULTURES: Bld Cx x 2 1/21 >>negative to date MRSA >>negative Sputum Cx 1/22>>  ANTIBIOTICS Levaquin 1/21>>1/24 Meropenem 1/24>> Vanc 1/21>>1/24 Aztreonam 1/21>>1/24  VITAL SIGNS: Temp:  [96.8 F (36 C)-99.9 F (37.7 C)] 99.7 F (37.6 C) (01/31 0700) Pulse Rate:  [43-113] 76 (01/31 0700) Resp:  [16-25] 16 (01/31 0700) BP: (61-146)/(44-88) 118/78 mmHg (01/31 0700) SpO2:  [91 %-100 %] 99 % (01/31 0849) FiO2 (%):  [50 %] 50 % (01/31 0849) Weight:  [175 lb 11.3 oz (79.7 kg)] 175 lb 11.3 oz (79.7 kg) (01/31 0454) HEMODYNAMICS:   VENTILATOR SETTINGS: Vent Mode:  [-] PRVC FiO2 (%):  [50 %] 50 % Set Rate:  [16 bmp-20 bmp] 16 bmp Vt Set:  [450 mL] 450 mL PEEP:  [5 cmH20] 5 cmH20 Plateau Pressure:  [20 cmH20] 20 cmH20 INTAKE /  OUTPUT:  Intake/Output Summary (Last 24 hours) at 03/15/15 1206 Last data filed at 03/15/15 0600  Gross per 24 hour  Intake   1200 ml  Output    925 ml  Net    275 ml    Review of Systems  Unable to perform ROS: critical illness  Respiratory: Positive for shortness of breath and wheezing.     Physical Exam  Constitutional: She appears distressed.  HENT:  Head: Normocephalic and atraumatic.  Eyes: Pupils are equal, round, and reactive to light.  Neck: Normal range of motion. Neck supple.  Cardiovascular: Normal rate, regular rhythm and normal heart sounds.   No murmur heard. Pulmonary/Chest: She is in respiratory distress. She has wheezes. She has rales.  Abdominal: Soft. Bowel sounds are normal.  Musculoskeletal: Normal range of motion.  Neurological:  lethargic  Skin: Skin is warm. She is diaphoretic.  Nursing note and vitals reviewed.    LABS:  CBC  Recent Labs Lab 03/10/15 0826 03/14/15 0459 03/15/15 0641  WBC  --  14.1* 14.4*  HGB  --  11.5* 11.4*  HCT  --  36.0 35.3  PLT 148* 120* 138*   Coag's No results for input(s): APTT, INR in the last 168 hours. BMET  Recent Labs Lab 03/08/15 1939 03/11/15 0436 03/14/15 0459 03/15/15 0641  NA 140  --  148* 144  K 4.0  --  4.6 3.7  CL 101  --  106 105  CO2 36*  --  37* 31  BUN 27*  --  29* 33*  CREATININE 0.65 0.55 0.94 0.85  GLUCOSE 176*  --  96 83   Electrolytes  Recent Labs Lab 03/08/15 1939 03/14/15 0459 03/15/15 0641  CALCIUM 7.9* 8.3* 7.9*   Sepsis Markers No results for input(s): LATICACIDVEN, PROCALCITON, O2SATVEN in the last 168 hours. ABG  Recent Labs Lab 03/12/15 2033 03/14/15 1715 03/14/15 2128  PHART 7.30* 7.19* 7.50*  PCO2ART 83* 102* 46  PO2ART 59* 56* 40*   Liver Enzymes  Recent Labs Lab 03/08/15 1939 03/10/15 0826 03/14/15 0459 03/15/15 0641  AST 272* 123* 33  --   ALT 763* 541* 180*  --   ALKPHOS 101 99 80  --   BILITOT 0.4 0.3 0.6  --   ALBUMIN 3.0* 3.2*  2.7* 2.4*   Cardiac Enzymes No results for input(s): TROPONINI, PROBNP in the last 168 hours. Glucose  Recent Labs Lab 03/09/15 0805 03/09/15 1204 03/10/15 0720 03/11/15 1117 03/12/15 0757 03/14/15 0835  GLUCAP 104* 146* 81 116* 114* 104*    Imaging Dg Chest 1 View  03/15/2015  CLINICAL DATA:  Intubation, endotracheal tube EXAM: CHEST 1 VIEW COMPARISON:  03/14/2015 FINDINGS: Endotracheal tube remains approximately 2 cm above the carina. Dense left lung consolidation has worsened since prior study. Continued patchy right lower lobe lobe opacity. Low lung volumes. Suspect small effusions. Mild cardiomegaly. IMPRESSION: Worsening consolidation throughout the left lung or concerning for pneumonia. Stable right basilar infiltrate. Small effusions. Electronically Signed   By: Rolm Baptise M.D.   On: 03/15/2015 07:48   Dg Chest 1 View  03/14/2015  CLINICAL DATA:  Status post intubation EXAM: CHEST 1 VIEW COMPARISON:  03/11/2015 FINDINGS: ET tube tip is 2 cm above the carina. The heart size is normal. There are bilateral pleural effusions identified. Left upper lobe perihilar consolidation and right lower lobe consolidation identified. When compared with the exam from 03/11/2015 there has been significantly improved aeration to the left lung. IMPRESSION: 1. Significantly improved aeration to the left lung. 2. Bilateral pleural effusions. Electronically Signed   By: Kerby Moors M.D.   On: 03/14/2015 19:13   Dg Abd 1 View  03/15/2015  CLINICAL DATA:  Assess orogastric tube positioning. EXAM: ABDOMEN - 1 VIEW COMPARISON:  Supine abdominal film of March 14, 2015 FINDINGS: The orogastric tube tip projects at the junction of the middle and distal thirds of the stomach. The proximal port is not clearly demonstrated. There remain loops of mildly distended gas-filled bowel in the upper abdomen. The retrocardiac region on the left remains dense. IMPRESSION: The tip of the orogastric tube lies in the  region of the junction of the middle and distal thirds of the stomach. Electronically Signed   By: David  Martinique M.D.   On: 03/15/2015 09:12   Dg Abd 1 View  03/14/2015  CLINICAL DATA:  OG tube placement at bedside. EXAM: Portable ABDOMEN - 1 VIEW COMPARISON:  03/10/2015. FINDINGS: Patient motion blurred the image. OG tube tip in the proximal body of the stomach. Bowel gas pattern unremarkable without evidence of obstruction or significant ileus. IMPRESSION: OG tube tip in the proximal body of the stomach. No acute abdominal abnormality. Electronically Signed   By: Evangeline Dakin M.D.   On: 03/14/2015 20:19   Dg Chest Port 1 View  03/15/2015  CLINICAL DATA:  Intubation . EXAM: PORTABLE CHEST 1 VIEW COMPARISON:  03/15/2015 . FINDINGS: Endotracheal tube and NG tube in stable position. Persistent diffuse severe consolidation throughout the left lung. Persistent right base pulmonary infiltrate with  slight worsening. No pleural that small pleural effusions cannot be excluded. No pneumothorax. Stable cardiomegaly. Surgical clips right axilla. IMPRESSION: 1. Endotracheal tube and NG tube in stable position. 2. Persistent diffuse dense unchanged infiltrate throughout the left lung. 3. Progressive infiltrate right lung base. Small bilateral pleural effusion cannot be excluded. Electronically Signed   By: Marcello Moores  Register   On: 03/15/2015 09:10       Deep Alaze Garverick M.D.  Critical Care Attestation.  I have personally obtained a history, examined the patient, evaluated laboratory and imaging results, formulated the assessment and plan and placed orders. The Patient requires high complexity decision making for assessment and support, frequent evaluation and titration of therapies, application of advanced monitoring technologies and extensive interpretation of multiple databases. The patient has critical illness that could lead imminently to failure of 1 or more organ systems and requires the highest level of  physician preparedness to intervene.  Critical Care Time devoted to patient care services described in this note is 35 minutes and is exclusive of time spent in procedures.

## 2015-03-15 NOTE — Progress Notes (Signed)
Nutrition Follow-up   INTERVENTION:   EN: received verbal order from MD Ramachandran to start TF. Recommend starting on Vital High Protein per Adult Tube Feeding Protocol; recommend starting at rate of 20 ml/hr, initial goal rate of 60 ml/hr providing 1440 kcals, 127 g of protein, 1058 mL of free water. Reassess on follow-up   NUTRITION DIAGNOSIS:   Inadequate oral intake related to inability to eat as evidenced by NPO status. Being addressed via TF  GOAL:   Patient will meet greater than or equal to 90% of their needs  MONITOR:    (Energy Intake, Anthropometrics, Pulmonary Profile, Digestive System)  REASON FOR ASSESSMENT:   LOS    ASSESSMENT:   Pt admitted with NSTEMI, recently transferred to ICU on bipap for respiratory acidosis.  Pt with worsening respiratory status, severe acidosis with lethargy, requiring intubation yesterda. Pt s/p self-extubation with subsequent reintubation, currently sedated on vent  Diet Order:  NPO  Energy Intake: recorded po intake 81% on average up until 1/26 on dysphagia diet, NPO until 1/30  Last BM:  03/07/2015, no recent BM  Electrolyte and Renal Profile:  Recent Labs Lab 03/08/15 1939 03/11/15 0436 03/14/15 0459 03/15/15 0641  BUN 27*  --  29* 33*  CREATININE 0.65 0.55 0.94 0.85  NA 140  --  148* 144  K 4.0  --  4.6 3.7   Glucose Profile:   Recent Labs  03/14/15 0835  GLUCAP 104*   Meds: reglan, NS at 50 ml/hr, diprivan, lactulose, solumedrol, senokot  Height:   Ht Readings from Last 1 Encounters:  02/15/2015 5\' 5"  (1.651 m)    Weight:   Wt Readings from Last 1 Encounters:  03/15/15 175 lb 11.3 oz (79.7 kg)    BMI:  Body mass index is 29.24 kg/(m^2).  Estimated Nutritional Needs:   Kcal:  1599 kcals (ve: 9, Tmax: 37.7) using wt of 79.7 kg  Protein:  96-120 g (1.2-1.5 g/kg)   Fluid:  2000-2400 mL (25-30 ml/kg)   EDUCATION NEEDS:   Education needs no appropriate at this time   [place], [place], LDN [phone] Pager  [phone] Weekend/On-Call Pager

## 2015-03-15 NOTE — Care Management (Signed)
Noticed that DNR order is still current.  Julie Anthony cox will attend progression rounds for this CM and will discuss this point with the attending.  Patient was intubated last pm but self extubated herself

## 2015-03-15 NOTE — Procedures (Signed)
Endotracheal Intubation: Patient required placement of an artificial airway secondary to resp failure. The patient self extubated earlier this morning, she was monitored for approximately 20 minutes with good oxygen saturations, however, she quickly became obtunded with a fixed vertical gaze and minimally responsive.  Consent: Emergent.   Hand washing performed prior to starting the procedure.   Medications administered for sedation prior to procedure: Midazolam 4 mg IV,   Fentanyl 100 mcg IV.   Procedure: A time out procedure was called and correct patient, name, & ID confirmed. Needed supplies and equipment were assembled and checked to include ETT, 10 ml syringe, Glidescope, Mac and Miller blades, suction, oxygen and bag mask valve, end tidal CO2 monitor. Patient was positioned to align the mouth and pharynx to facilitate visualization of the glottis.  Heart rate, SpO2 and blood pressure was continuously monitored during the procedure. Pre-oxygenation was conducted prior to intubation and endotracheal tube was placed through the vocal cords into the trachea.    The artificial airway was placed under direct visualization via glidescope route [id number] on the first attempt.    ETT was secured at 23 cm mark.    Placement was confirmed by auscuitation of lungs with good breath sounds bilaterally and no stomach sounds.  Condensation was noted on endotracheal tube.  Pulse ox %.  CO2 detector in place with appropriate color change.   Complications: None .     Chest radiograph ordered and pending.     Marda Stalker, M.D.

## 2015-03-15 NOTE — Progress Notes (Signed)
Patient self extubated and has failed to maintain respiratory status post extubation.  Dr. Juanell Fairly at bedside to reintubate.  Pt reintubated with 7.5ETT using 3MAC blade on glidoscope on first attempt.  Good color change on cap and equal BBS.  Patient tube secured at 23 at lip.  CXR pending.

## 2015-03-16 ENCOUNTER — Inpatient Hospital Stay: Payer: Medicare (Managed Care)

## 2015-03-16 LAB — MAGNESIUM: MAGNESIUM: 2 mg/dL (ref 1.7–2.4)

## 2015-03-16 LAB — BLOOD GAS, ARTERIAL
ALLENS TEST (PASS/FAIL): POSITIVE — AB
Acid-Base Excess: 10.2 mmol/L — ABNORMAL HIGH (ref 0.0–3.0)
Bicarbonate: 38.2 mEq/L — ABNORMAL HIGH (ref 21.0–28.0)
DELIVERY SYSTEMS: POSITIVE
EXPIRATORY PAP: 6
FIO2: 0.7
INSPIRATORY PAP: 16
MECHANICAL RATE: 12
O2 Saturation: 94.2 %
Patient temperature: 37
pCO2 arterial: 66 mmHg — ABNORMAL HIGH (ref 32.0–48.0)
pH, Arterial: 7.37 (ref 7.350–7.450)
pO2, Arterial: 74 mmHg — ABNORMAL LOW (ref 83.0–108.0)

## 2015-03-16 LAB — BASIC METABOLIC PANEL
Anion gap: 3 — ABNORMAL LOW (ref 5–15)
BUN: 36 mg/dL — AB (ref 6–20)
CALCIUM: 8 mg/dL — AB (ref 8.9–10.3)
CO2: 39 mmol/L — ABNORMAL HIGH (ref 22–32)
CREATININE: 0.86 mg/dL (ref 0.44–1.00)
Chloride: 109 mmol/L (ref 101–111)
GFR calc Af Amer: >60 mL/min (ref >60)
Glucose, Bld: 91 mg/dL (ref 65–99)
POTASSIUM: 4.6 mmol/L (ref 3.5–5.1)
SODIUM: 151 mmol/L — AB (ref 135–145)

## 2015-03-16 LAB — PHOSPHORUS: Phosphorus: 2.7 mg/dL (ref 2.5–4.6)

## 2015-03-16 LAB — CBC
HCT: 34.2 % — ABNORMAL LOW (ref 35.0–47.0)
Hemoglobin: 11 g/dL — ABNORMAL LOW (ref 12.0–16.0)
MCH: 31.2 pg (ref 26.0–34.0)
MCHC: 32.2 g/dL (ref 32.0–36.0)
MCV: 96.8 fL (ref 80.0–100.0)
PLATELETS: 117 10*3/uL — AB (ref 150–440)
RBC: 3.53 MIL/uL — AB (ref 3.80–5.20)
RDW: 15.4 % — AB (ref 11.5–14.5)
WBC: 11.2 10*3/uL — ABNORMAL HIGH (ref 3.6–11.0)

## 2015-03-16 LAB — GLUCOSE, CAPILLARY
GLUCOSE-CAPILLARY: 84 mg/dL (ref 65–99)
GLUCOSE-CAPILLARY: 87 mg/dL (ref 65–99)
GLUCOSE-CAPILLARY: 68 mg/dL (ref 65–99)
Glucose-Capillary: 136 mg/dL — ABNORMAL HIGH (ref 65–99)

## 2015-03-16 MED ORDER — LACTULOSE 10 GM/15ML PO SOLN
30.0000 g | Freq: Two times a day (BID) | ORAL | Status: DC
  Administered 2015-03-16 – 2015-03-22 (×11): 30 g via ORAL
  Filled 2015-03-16 (×11): qty 60

## 2015-03-16 MED ORDER — PREDNISONE 20 MG PO TABS
40.0000 mg | ORAL_TABLET | Freq: Every day | ORAL | Status: AC
  Administered 2015-03-19: 40 mg via ORAL
  Filled 2015-03-16: qty 2

## 2015-03-16 MED ORDER — SODIUM CHLORIDE 0.9% FLUSH: 10.0000 mL | INTRAVENOUS | Status: DC | PRN

## 2015-03-16 MED ORDER — PREDNISONE 20 MG PO TABS
50.0000 mg | ORAL_TABLET | Freq: Every day | ORAL | Status: AC
  Filled 2015-03-16: qty 1

## 2015-03-16 MED ORDER — SODIUM CHLORIDE 0.9 % IV BOLUS (SEPSIS)
500.0000 mL | Freq: Once | INTRAVENOUS | Status: AC
  Administered 2015-03-16: 500 mL via INTRAVENOUS

## 2015-03-16 MED ORDER — MIRTAZAPINE 15 MG PO TABS
7.5000 mg | ORAL_TABLET | Freq: Every day | ORAL | Status: DC
  Administered 2015-03-16 – 2015-03-20 (×5): 7.5 mg via ORAL
  Filled 2015-03-16 (×5): qty 1

## 2015-03-16 MED ORDER — SODIUM CHLORIDE 0.9% FLUSH
10.0000 mL | Freq: Two times a day (BID) | INTRAVENOUS | Status: DC
  Administered 2015-03-16: 30 mL
  Administered 2015-03-16: 10 mL
  Administered 2015-03-17: 30 mL
  Administered 2015-03-18 – 2015-03-20 (×5): 10 mL
  Administered 2015-03-21: 20 mL
  Administered 2015-03-21 – 2015-03-24 (×6): 10 mL

## 2015-03-16 MED ORDER — PREDNISONE 20 MG PO TABS
30.0000 mg | ORAL_TABLET | Freq: Every day | ORAL | Status: AC
  Administered 2015-03-20: 30 mg via ORAL
  Filled 2015-03-16: qty 1

## 2015-03-16 MED ORDER — DEXTROSE 50 % IV SOLN
50.0000 mL | Freq: Once | INTRAVENOUS | Status: AC
  Administered 2015-03-16: 50 mL via INTRAVENOUS
  Filled 2015-03-16: qty 50

## 2015-03-16 MED ORDER — SODIUM CHLORIDE 0.45 % IV SOLN
INTRAVENOUS | Status: DC
  Administered 2015-03-16: 12:00:00 via INTRAVENOUS

## 2015-03-16 MED ORDER — PREDNISONE 20 MG PO TABS
20.0000 mg | ORAL_TABLET | Freq: Every day | ORAL | Status: AC
  Administered 2015-03-21: 20 mg via ORAL
  Filled 2015-03-16: qty 1

## 2015-03-16 MED ORDER — PREDNISONE 10 MG PO TABS
10.0000 mg | ORAL_TABLET | Freq: Every day | ORAL | Status: AC
  Administered 2015-03-22: 10 mg via ORAL
  Filled 2015-03-16: qty 1

## 2015-03-16 MED ORDER — PREDNISONE 20 MG PO TABS
60.0000 mg | ORAL_TABLET | Freq: Every day | ORAL | Status: AC
Start: 2015-03-17 — End: 2015-03-17
  Administered 2015-03-17: 60 mg via ORAL
  Filled 2015-03-16: qty 3

## 2015-03-16 NOTE — Progress Notes (Addendum)
[name] NAME: Julie Anthony    MR#:  [id number]  DATE OF BIRTH:  1940-09-24  SUBJECTIVE:  Patient unable to provide meaningful information at this time given mental status/medical condition Overnight she became hypoxic when she self discontinued her BiPAP she was since going placed on Precedex. She is currently doing well on Precedex and nasal cannula  REVIEW OF SYSTEMS:  CONSTITUTIONAL: No fever, fatigue or weakness.  EYES: No blurred or double vision.  EARS, NOSE, AND THROAT: No tinnitus or ear pain.  RESPIRATORY: No cough, shortness of breath, wheezing or hemoptysis.  CARDIOVASCULAR: No chest pain, orthopnea, edema.  GASTROINTESTINAL: No nausea, vomiting, diarrhea or abdominal pain.  GENITOURINARY: No dysuria, hematuria.  ENDOCRINE: No polyuria, nocturia,  HEMATOLOGY: No anemia, easy bruising or bleeding SKIN: No rash or lesion. MUSCULOSKELETAL: No joint pain or arthritis.   NEUROLOGIC: No tingling, numbness, weakness.  PSYCHIATRY: No anxiety or depression.   DRUG ALLERGIES:   Allergies  Allergen Reactions  . Penicillins Rash  . Codeine Other (See Comments)    Unknown reaction.  . Ceftin [Cefuroxime Axetil] Hives    Unknown reaction  . Erythromycin Other (See Comments)    Unknown reaction  . Ibuprofen Other (See Comments)    Unknown reaction  . Sulfa Antibiotics Other (See Comments)    Unknown reaction    VITALS:  Blood pressure 116/74, pulse 90, temperature 98.3 F (36.8 C), temperature source Axillary, resp. rate 18, height 5\' 5"  (1.651 m), weight 80.4 kg (177 lb 4 oz), SpO2 94 %.  PHYSICAL EXAMINATION:  VITAL SIGNS: Filed Vitals:   03/16/15 0816 03/16/15 0823  BP:    Pulse: 85 90  Temp:    Resp: 66 18   GENERAL:75 y.o.female currently in minimal acute distress. Given mental status/respiratory status  HEAD: Normocephalic, atraumatic.  EYES: Pupils equal, round, reactive to light. Extraocular  muscles intact. No scleral icterus.  MOUTH: Dry mucosal membrane. Dentition intact. No abscess noted.  EAR, NOSE, THROAT: Clear without exudates. No external lesions.  NECK: Supple. No thyromegaly. No nodules. No JVD.  PULMONARY: Coarse breath sounds bilaterally without wheeze rails or rhonci. No use of accessory muscles, Good respiratory effort. good air entry bilaterally CHEST: Nontender to palpation.  CARDIOVASCULAR: S1 and S2. Regular rate and rhythm. No murmurs, rubs, or gallops. No edema. Pedal pulses 2+ bilaterally.  GASTROINTESTINAL: Soft, nontender, nondistended. No masses. Positive bowel sounds. No hepatosplenomegaly.  MUSCULOSKELETAL: No swelling, clubbing, or edema. Range of motion full in all extremities.  NEUROLOGIC: Cranial nerves II through XII are intact. No gross focal neurological deficits. Sensation intact. Reflexes intact.  SKIN: No ulceration, lesions, rashes, or cyanosis. Skin warm and dry. Turgor intact.  PSYCHIATRIC: Mood, affect flattened/confused. The patient is awake, alert and oriented x self. Insight, judgment poor at this time.      LABORATORY PANEL:   CBC  Recent Labs Lab 03/16/15 0424  WBC 11.2*  HGB 11.0*  HCT 34.2*  PLT 117*   ------------------------------------------------------------------------------------------------------------------  Chemistries   Recent Labs Lab 03/14/15 0459  03/16/15 0424  NA 148*  < > 151*  K 4.6  < > 4.6  CL 106  < > 109  CO2 37*  < > 39*  GLUCOSE 96  < > 91  BUN 29*  < > 36*  CREATININE 0.94  < > 0.86  CALCIUM 8.3*  < > 8.0*  MG  --   --  2.0  AST 33  --   --  ALT 180*  --   --   ALKPHOS 80  --   --   BILITOT 0.6  --   --   < > = values in this interval not displayed. ------------------------------------------------------------------------------------------------------------------  Cardiac Enzymes No results for input(s): TROPONINI in the last 168  hours. ------------------------------------------------------------------------------------------------------------------  RADIOLOGY:  Dg Chest 1 View  03/16/2015  CLINICAL DATA:  Shortness of breath. EXAM: CHEST 1 VIEW COMPARISON:  03/15/2015, 03/15/2015, 03/11/2015. FINDINGS: Right PICC line in stable position. Stable cardiomegaly. Persistent dense left lung infiltrate. Interim slight improvement right lung infiltrate. Bilateral pleural effusions. No pneumothorax. Surgical clips right axilla . IMPRESSION: 1. Right PICC line in stable position. 2. Persistent dense diffuse left lung infiltrate. No interim change.Slight partial clearing of right lung infiltrate. Persistent small bilateral pleural effusions. 3. Stable cardiomegaly. Electronically Signed   By: Marcello Moores  Register   On: 03/16/2015 07:46   Dg Chest 1 View  03/15/2015  CLINICAL DATA:  PICC line placement. EXAM: CHEST 1 VIEW COMPARISON:  03/15/2015 at 12:14 p.m. FINDINGS: Patient's right-sided PICC line has been pulled back and has tip over the SVC just below the level of the carina. Lungs are hypoinflated as patient is rotated to the left. There is continued moderate bilateral perihilar opacification suggesting interstitial edema with bilateral pleural effusions unchanged. Cannot exclude infection in the mid to lower lungs. Stable cardiomegaly. Remainder of the exam is unchanged. IMPRESSION: Hypoinflation with persistent stable central airspace opacification suggesting interstitial edema. Evidence of bilateral pleural effusions without significant change. Cannot exclude infection over the mid to lower lungs. Right-sided PICC line which has been pulled back and has tip over the SVC. Electronically Signed   By: Marin Olp M.D.   On: 03/15/2015 13:24   Dg Chest 1 View  03/15/2015  CLINICAL DATA:  Intubation, endotracheal tube EXAM: CHEST 1 VIEW COMPARISON:  03/14/2015 FINDINGS: Endotracheal tube remains approximately 2 cm above the carina. Dense  left lung consolidation has worsened since prior study. Continued patchy right lower lobe lobe opacity. Low lung volumes. Suspect small effusions. Mild cardiomegaly. IMPRESSION: Worsening consolidation throughout the left lung or concerning for pneumonia. Stable right basilar infiltrate. Small effusions. Electronically Signed   By: Rolm Baptise M.D.   On: 03/15/2015 07:48   Dg Chest 1 View  03/14/2015  CLINICAL DATA:  Status post intubation EXAM: CHEST 1 VIEW COMPARISON:  03/11/2015 FINDINGS: ET tube tip is 2 cm above the carina. The heart size is normal. There are bilateral pleural effusions identified. Left upper lobe perihilar consolidation and right lower lobe consolidation identified. When compared with the exam from 03/11/2015 there has been significantly improved aeration to the left lung. IMPRESSION: 1. Significantly improved aeration to the left lung. 2. Bilateral pleural effusions. Electronically Signed   By: Kerby Moors M.D.   On: 03/14/2015 19:13   Dg Abd 1 View  03/15/2015  CLINICAL DATA:  Assess orogastric tube positioning. EXAM: ABDOMEN - 1 VIEW COMPARISON:  Supine abdominal film of March 14, 2015 FINDINGS: The orogastric tube tip projects at the junction of the middle and distal thirds of the stomach. The proximal port is not clearly demonstrated. There remain loops of mildly distended gas-filled bowel in the upper abdomen. The retrocardiac region on the left remains dense. IMPRESSION: The tip of the orogastric tube lies in the region of the junction of the middle and distal thirds of the stomach. Electronically Signed   By: David  Martinique M.D.   On: 03/15/2015 09:12   Dg Abd 1  View  03/14/2015  CLINICAL DATA:  OG tube placement at bedside. EXAM: Portable ABDOMEN - 1 VIEW COMPARISON:  03/10/2015. FINDINGS: Patient motion blurred the image. OG tube tip in the proximal body of the stomach. Bowel gas pattern unremarkable without evidence of obstruction or significant ileus. IMPRESSION: OG  tube tip in the proximal body of the stomach. No acute abdominal abnormality. Electronically Signed   By: Evangeline Dakin M.D.   On: 03/14/2015 20:19   Dg Chest Port 1 View  03/15/2015  CLINICAL DATA:  PICC line placement EXAM: PORTABLE CHEST 1 VIEW COMPARISON:  03/15/2015 FINDINGS: Areas of consolidation at throughout the left lung and in the right lower lung. This is not significantly changed since prior study. Probable layering bilateral effusions. Cardiomegaly. Right PICC line has been placed with the tip in the lower right atrium approximately 8 cm deep to the cavoatrial junction. Interval extubation. IMPRESSION: Right PICC line tip in the lower right atrium approximately 8 cm deep to the cavoatrial junction. Interval extubation. Continued bilateral airspace disease, left greater than right, not significantly changed. Small layering effusions. Electronically Signed   By: Rolm Baptise M.D.   On: 03/15/2015 12:32   Dg Chest Port 1 View  03/15/2015  CLINICAL DATA:  Intubation . EXAM: PORTABLE CHEST 1 VIEW COMPARISON:  03/15/2015 . FINDINGS: Endotracheal tube and NG tube in stable position. Persistent diffuse severe consolidation throughout the left lung. Persistent right base pulmonary infiltrate with slight worsening. No pleural that small pleural effusions cannot be excluded. No pneumothorax. Stable cardiomegaly. Surgical clips right axilla. IMPRESSION: 1. Endotracheal tube and NG tube in stable position. 2. Persistent diffuse dense unchanged infiltrate throughout the left lung. 3. Progressive infiltrate right lung base. Small bilateral pleural effusion cannot be excluded. Electronically Signed   By: Marcello Moores  Register   On: 03/15/2015 09:10    EKG:   Orders placed or performed during the hospital encounter of 03/09/2015  . ED EKG  . ED EKG    ASSESSMENT AND PLAN:     1. Acute on chronic respiratory failure with hypoxia and hypercapnia due to acute exacerbation of COPD, left lower lobe  pneumonia, concerning for aspiration pneumonia, Currently off of BiPAP doing well with nasal cannula however requires Precedex intubated 3 and self extubated 3 times since last night  Currently full code Meropenem Subpleural oxygen, DuoNeb treatments Repeat chest x-ray minimal change Speech therapy- as per RNs report patient is tolerating thickened liquids at this time without any aspiration. Discussed with speech therapy. Their concern is that patient is regurgitating a lot which would precipitate aspiration 2. Hypernatremia: IV fluid hydration follow sodium level free water deficit 3.16 L currently receiving half-normal saline will increase rate 2. Acute renal failure, resolved     3. Non-Q-wave MI, type 2, due to demand ischemia, per cardiologist, continue aspirin, heparin given , nitrates, atenolol, no intervention was recommended  4. Elevated transaminases , likely due to statin, improvedFollowing LFTs intermittently  Disposition: Appreciate provide care input, it seems the family wishes patient to remain full code for 1 week duration, if no improvement at that time further decisions will be made. They do not wish to pursue trach/PEG option      All the records are reviewed and case discussed with Care Management/Social Workerr. Management plans discussed with the patient, family and they are in agreement.  CODE STATUS: Full  TOTAL TIME TAKING CARE OF THIS PATIENT: 40 minutes.   POSSIBLE D/C IN 4-5 DAYS, DEPENDING ON CLINICAL CONDITION.  Hower,  Karenann Cai.D on 03/16/2015 at 12:43 PM  Between 7am to 6pm - Pager - [phone]  After 6pm: House Pager: - [phone]  [facility]  Office  [phone]  CC: Primary care physician; [place]

## 2015-03-16 NOTE — Progress Notes (Signed)
[facility] NOTE  Pharmacy Consult for Electrolyte and Phenytoin Monitoring      Allergies  Allergen Reactions  . Penicillins Rash  . Codeine Other (See Comments)    Unknown reaction.  . Ceftin [Cefuroxime Axetil] Hives    Unknown reaction  . Erythromycin Other (See Comments)    Unknown reaction  . Ibuprofen Other (See Comments)    Unknown reaction  . Sulfa Antibiotics Other (See Comments)    Unknown reaction    Patient Measurements: Height: 5\' 5"  (165.1 cm) Weight: 177 lb 4 oz (80.4 kg) IBW/kg (Calculated) : 57  Vital Signs: Temp: 97.9 F (36.6 C) (02/01 1100) Temp Source: Axillary (02/01 1100) BP: 95/58 mmHg (02/01 1100) Pulse Rate: 47 (02/01 1310) Intake/Output from previous day: 01/31 0701 - 02/01 0700 In: 1575.7 [I.V.:1175.7; IV Piggyback:400] Out: 1050 [Urine:1050] Intake/Output from this shift: Total I/O In: 62.5 [I.V.:62.5] Out: 150 [Urine:150] Vent settings for last 24 hours: Vent Mode:  [-]  FiO2 (%):  [45 %-100 %] 45 %  Labs:  Recent Labs  03/14/15 0459 03/15/15 0641 03/16/15 0424  WBC 14.1* 14.4* 11.2*  HGB 11.5* 11.4* 11.0*  HCT 36.0 35.3 34.2*  PLT 120* 138* 117*  CREATININE 0.94 0.85 0.86  MG  --   --  2.0  PHOS  --   --  2.7  ALBUMIN 2.7* 2.4*  --   PROT 5.4*  --   --   AST 33  --   --   ALT 180*  --   --   ALKPHOS 80  --   --   BILITOT 0.6  --   --    Estimated Creatinine Clearance: 58.3 mL/min (by C-G formula based on Cr of 0.86).   Recent Labs  03/14/15 0835 03/16/15 1141  GLUCAP 104* 64    Microbiology: Recent Results (from the past 720 hour(s))  Culture, blood (Routine X 2) w Reflex to ID Panel     Status: None   Collection Time: 03/01/2015  1:00 PM  Result Value Ref Range Status   Specimen Description BLOOD LEFT ANTECUBITAL  Final   Special Requests BOTTLES DRAWN AEROBIC AND ANAEROBIC  1CC  Final   Culture NO GROWTH 5 DAYS  Final   Report Status 03/10/2015 FINAL  Final  Culture, blood (Routine  X 2) w Reflex to ID Panel     Status: None   Collection Time: 02/25/2015  1:04 PM  Result Value Ref Range Status   Specimen Description BLOOD LEFT HAND  Final   Special Requests BOTTLES DRAWN AEROBIC AND ANAEROBIC  1CC  Final   Culture NO GROWTH 5 DAYS  Final   Report Status 03/10/2015 FINAL  Final  MRSA PCR Screening     Status: None   Collection Time: 03/07/15  5:32 PM  Result Value Ref Range Status   MRSA by PCR NEGATIVE NEGATIVE Final    Comment:        The GeneXpert MRSA Assay (FDA approved for NASAL specimens only), is one component of a comprehensive MRSA colonization surveillance program. It is not intended to diagnose MRSA infection nor to guide or monitor treatment for MRSA infections.   MRSA PCR Screening     Status: None   Collection Time: 03/11/15 11:20 AM  Result Value Ref Range Status   MRSA by PCR NEGATIVE NEGATIVE Final    Comment:        The GeneXpert MRSA Assay (FDA approved for NASAL specimens only), is one component  of a comprehensive MRSA colonization surveillance program. It is not intended to diagnose MRSA infection nor to guide or monitor treatment for MRSA infections.   Culture, respiratory (NON-Expectorated)     Status: None (Preliminary result)   Collection Time: 03/14/15  9:28 PM  Result Value Ref Range Status   Specimen Description TRACHEAL ASPIRATE  Final   Special Requests Normal  Final   Gram Stain MODERATE WBC SEEN NO ORGANISMS SEEN   Final   Culture NO GROWTH 2 DAYS  Final   Report Status PENDING  Incomplete    Medications:  Scheduled:  . acetylcysteine  3 mL Nebulization BID  . antiseptic oral rinse  7 mL Mouth Rinse q12n4p  . antiseptic oral rinse  7 mL Mouth Rinse QID  . aspirin EC  81 mg Oral Daily  . budesonide (PULMICORT) nebulizer solution  0.5 mg Nebulization BID  . chlorhexidine gluconate  15 mL Mouth Rinse BID  . cholecalciferol  1,000 Units Oral Daily  . clopidogrel  75 mg Oral Daily  . docusate sodium  100 mg Oral  BID  . DULoxetine  60 mg Oral Daily  . enoxaparin (LOVENOX) injection  40 mg Subcutaneous Q24H  . famotidine (PEPCID) IV  20 mg Intravenous Q12H  . ipratropium-albuterol  3 mL Nebulization Q4H  . lactulose  30 g Oral BID  . levothyroxine  175 mcg Oral QHS  . loratadine  10 mg Oral Daily  . meropenem (MERREM) IV  1 g Intravenous 3 times per day  . metoCLOPramide  5 mg Oral TID AC & HS  . mirtazapine  7.5 mg Oral QHS  . phenytoin (DILANTIN) IV  100 mg Intravenous 3 times per day  . [START ON 03/17/2015] predniSONE  60 mg Oral Q breakfast   Followed by  . [START ON 03/18/2015] predniSONE  50 mg Oral Q breakfast   Followed by  . [START ON 03/19/2015] predniSONE  40 mg Oral Q breakfast   Followed by  . [START ON 03/20/2015] predniSONE  30 mg Oral Q breakfast   Followed by  . [START ON 03/21/2015] predniSONE  20 mg Oral Q breakfast   Followed by  . [START ON 03/22/2015] predniSONE  10 mg Oral Q breakfast  . senna  2 tablet Oral QHS  . sodium chloride  500 mL Intravenous Once  . sodium chloride  3 mL Intravenous Q12H  . sodium chloride flush  10-40 mL Intracatheter Q12H  . traZODone  50 mg Oral QHS   Infusions:  . sodium chloride 100 mL (03/16/15 1301)  . dexmedetomidine 0.8 mcg/kg/hr (03/16/15 1310)    Assessment: Pharmacy consulted to monitor electrolytes and phenytoin for [name].  Plan:  1. Phenytoin: Level obtained 5.1, albumin 2.4, corrected phenytoin level 6.4. Phenytoin level is low, however patient has missed several doses of phenytoin due to inability to take PO. Will continue phenytoin 100 mg iv q 8 hours and check another level at steady-state.   2. Electrolytes: Electrolytes are within normal limits. Will recheck electrolytes with am labs.   Pharmacy will continue to monitor and adjust per consult.    Ulice Dash D 03/16/2015,2:42 PM

## 2015-03-16 NOTE — Progress Notes (Signed)
PULMONARY / CRITICAL CARE MEDICINE   Name: Julie Anthony MRN: [id number] DOB: [birth year]    ADMISSION DATE:  02/26/2015   CC follow up resp failure    ASSESSMENT / PLAN: 75 yo female with PMHx of COPD on 2L, Dementia, CAD, admitted for NSTEMI and AECOPD.  Noted to have LLL opacity, worsening respiratory distress on biPAP  1.AECOPD - cont with steroids and nebs, currently on Solu-Medrol 20 mg IV every 12-transition to oral steroid taper today. - f/u sputum cultures -Continue BiPAP at night, and as needed during the day. (If the patient will allow [place]). -Continue nebs with Mucomyst to promote airway clearance, as the patient had significant left-sided mucous plugging.  Respiratory culture 03/14/2015: Negative. MRSA screen negative. Blood culture 03/15/2015: Negative.  Antibiotics: Meropenem 03/09/2015 >>  2.LLL opacity c/w pneumonia with mucus plugs. Continue antibiotics.  -. Chest x-ray from 03/15/2015 and images reviewed, shows continued left-sided pneumonia.  3.Multifocal Pneumonia - plan as stated above - antibiotics as stated above  4.NSTEMI - CE if needed - cont with asa, heparin, nitrates, BP control   STUDIES:  1/23 CT Chest - Left upper and left lower lobe pulmonary opacities, suspicious for infection or aspiration. Fluid in the left endobronchial tree could relate to mucous plugging or aspiration. Enlarged PA.  SIGNIFICANT EVENTS: Intubated 03/14/2015. Self extubated 2 on 03/15/2015.   CULTURES: Bld Cx x 2 1/21 >>negative to date MRSA >>negative Sputum Cx 1/22>> negative thus far  ANTIBIOTICS Levaquin 1/21>>1/24 Meropenem 1/24>> Vanc 1/21>>1/24 Aztreonam 1/21>>1/24  VITAL SIGNS: Temp:  [97.7 F (36.5 C)-98.3 F (36.8 C)] 97.9 F (36.6 C) (02/01 1100) Pulse Rate:  [47-157] 47 (02/01 1310) Resp:  [15-23] 15 (02/01 0900) BP: (77-123)/(37-83) 95/58 mmHg (02/01 1100) SpO2:  [61 %-100 %] 97 % (02/01 1541) FiO2 (%):  [45 %-100 %] 70 % (02/01  1541) Weight:  [177 lb 4 oz (80.4 kg)] 177 lb 4 oz (80.4 kg) (02/01 0538) HEMODYNAMICS:   VENTILATOR SETTINGS: Vent Mode:  [-]  FiO2 (%):  [45 %-100 %] 70 % INTAKE / OUTPUT:  Intake/Output Summary (Last 24 hours) at 03/16/15 1555 Last data filed at 03/16/15 1400  Gross per 24 hour  Intake 1485.19 ml  Output   1000 ml  Net 485.19 ml    Review of Systems  Unable to perform ROS: critical illness  Respiratory: Positive for shortness of breath and wheezing.     Physical Exam  Constitutional: She appears distressed.  HENT:  Head: Normocephalic and atraumatic.  Eyes: Pupils are equal, round, and reactive to light.  Neck: Normal range of motion. Neck supple.  Cardiovascular: Normal rate, regular rhythm and normal heart sounds.   No murmur heard. Pulmonary/Chest: She is in respiratory distress. She has wheezes. She has rales.  Abdominal: Soft. Bowel sounds are normal.  Musculoskeletal: Normal range of motion.  Neurological:  lethargic  Skin: Skin is warm. She is diaphoretic.  Nursing note and vitals reviewed.    LABS:  CBC  Recent Labs Lab 03/14/15 0459 03/15/15 0641 03/16/15 0424  WBC 14.1* 14.4* 11.2*  HGB 11.5* 11.4* 11.0*  HCT 36.0 35.3 34.2*  PLT 120* 138* 117*   Coag's No results for input(s): APTT, INR in the last 168 hours. BMET  Recent Labs Lab 03/14/15 0459 03/15/15 0641 03/16/15 0424  NA 148* 144 151*  K 4.6 3.7 4.6  CL 106 105 109  CO2 37* 31 39*  BUN 29* 33* 36*  CREATININE 0.94 0.85 0.86  GLUCOSE 96 83  91   Electrolytes  Recent Labs Lab 03/14/15 0459 03/15/15 0641 03/16/15 0424  CALCIUM 8.3* 7.9* 8.0*  MG  --   --  2.0  PHOS  --   --  2.7   Sepsis Markers No results for input(s): LATICACIDVEN, PROCALCITON, O2SATVEN in the last 168 hours. ABG  Recent Labs Lab 03/14/15 2128 03/15/15 1320 03/16/15 0431  PHART 7.50* 7.40 7.37  PCO2ART 46 57* 66*  PO2ART 40* 98 74*   Liver Enzymes  Recent Labs Lab 03/10/15 0826  03/14/15 0459 03/15/15 0641  AST 123* 33  --   ALT 541* 180*  --   ALKPHOS 99 80  --   BILITOT 0.3 0.6  --   ALBUMIN 3.2* 2.7* 2.4*   Cardiac Enzymes No results for input(s): TROPONINI, PROBNP in the last 168 hours. Glucose  Recent Labs Lab 03/10/15 0720 03/11/15 1117 03/12/15 0757 03/14/15 0835 03/16/15 1141  GLUCAP 81 116* 114* 104* 84    Imaging Dg Chest 1 View  03/16/2015  CLINICAL DATA:  Shortness of breath. EXAM: CHEST 1 VIEW COMPARISON:  03/15/2015, 03/15/2015, 03/11/2015. FINDINGS: Right PICC line in stable position. Stable cardiomegaly. Persistent dense left lung infiltrate. Interim slight improvement right lung infiltrate. Bilateral pleural effusions. No pneumothorax. Surgical clips right axilla . IMPRESSION: 1. Right PICC line in stable position. 2. Persistent dense diffuse left lung infiltrate. No interim change.Slight partial clearing of right lung infiltrate. Persistent small bilateral pleural effusions. 3. Stable cardiomegaly. Electronically Signed   By: Marcello Moores  Register   On: 03/16/2015 07:46       Deep Terease Marcotte M.D.  Critical Care Attestation.  I have personally obtained a history, examined the patient, evaluated laboratory and imaging results, formulated the assessment and plan and placed orders. The Patient requires high complexity decision making for assessment and support, frequent evaluation and titration of therapies, application of advanced monitoring technologies and extensive interpretation of multiple databases. The patient has critical illness that could lead imminently to failure of 1 or more organ systems and requires the highest level of physician preparedness to intervene.  Critical Care Time devoted to patient care services described in this note is 35 minutes and is exclusive of time spent in procedures.

## 2015-03-16 NOTE — Progress Notes (Signed)
Pt continued to have intermittent periods of anxiety and pulling off of bipap. Pt cont. On precedex. Pt then started to settle down and rest well with bipap. precedex on hold at present. Will cont to monitor

## 2015-03-16 NOTE — Clinical Social Work Note (Signed)
CSW received another consult for placement. Assessment was completed last weekend and patient is slated to go to [facility] at discharge. Pasrr has been determined as well.  Shela Leff MSW,LCSW [phone]

## 2015-03-16 NOTE — Progress Notes (Signed)
Palliative Medicine Inpatient Consult Follow Up Note   Name: Julie Anthony Date: 03/16/2015 MRN: [id number]  DOB: 07-28-1940  Referring Physician: Lytle Butte, MD  Palliative Care consult requested for this 75 y.o. female for goals of medical therapy in patient with acute on chronic respiratory failure. She had been getting Septra DS to treat her pneumonia as an outpt. She was found to have persistent LLL pneumonia with ARF ( CR 2.26) and abnormal LFTs. AST was 1143 with ALT 1249. She also had elevated troponin's c/w a NSTEMI (5.5). She had no chest pain. She had been sent to the ER by Home health and since she was cared for by Home health, was diagnosed with HCAP/ Aspiration Pneumonia.     DISCUSSIONS AND DECISIONS: 1.  Pt is continuing as Full Code status.  At least for a week --per HCPOA.  I met with the HCPOA, Roena Malady (her name has been mispelled elsewhere), and her husband, Ron Sheffield (pts Pentacostal pastor), yesterday late afternoon.  Also present was a physician and Education officer, museum with the [place] desires to follow the pt's written wishes on her MOST form --for at least a week.  We discussed the fact that Dr. Alphonsus Sias had spoken with the pt on Sunday and at that time she stated she did not want to be on a ventilator etc.  However, there is some question about what the pt may have understood at that time ---based on the [facility] knowledge of patient's fairly simple comprehension ability.  They feel they would be OK with reducing aggressive care if it is not working well after about a week's time.    2. We discussed a lot of 'what if' scenarios --including the liklihood of her having a dysphagia and what life with a feeding tube might be like. We talked about pt ending up with a trach and a PEG and living in a long term hospital or facility distant from home.  The Sheffields do not feel pt would want to live like that, and if she had a more  sophisticated understanding of what might happen, she might have filled out the MOST form differently.  But, for now, they want to continue with aggressive care, knowing that the pt seems to be desiring mutually exclusive things (she wants that mask off --but she wants to live).  She will need SLP evaluations since she is thought to have experienced aspiration pneumonia.  The Sheffields say she would often 'get choked' while eating --but she was fully able to recover quickly.   3.  Pt will need sedation and restraints since we have to default to aggressive respiratory interventions given the current directives.  Perhaps her respiratory function will improve over the coming week.  The Sheffields tell me that even though the record states she was 'on 2 LPM of oxygen at home' she actually was not using it at all and did not seem to need it.    4.  I have ordered a sitter for pt (she takes O2 off every 5 minutes).  She is more confused today.  She keeps asking for food and water and due to suspected aspiration, she will benefit from an SLP eval (as discussed with the HCPOA yesterday).   5. I plan to follow pt 'at a distance' for now.  I will review the record daily but do not plan on daily visits until some time has passed or until she has a change in condition --  for better or worse.  I will speak with the HCPOA at some point next week when we know more about how pt is responding to current treatments for her acute pneumonia and her chronic lung disease.  (I have learned she was not ever a smoker but was around passive smoke until her companion, Barnabas Lister, died).   -----------------------------------------------------------------------------------------  IMPRESSION: Acute on chronic Hypoxic and Hypercapceic respiratory failure ---chronically on 2 LPM O2 at home ---due to HCAP (multifocal) and COPD ---suspected to be aspiration related ---was on BIPAP but required intubation last evening ---code status was  reversed to full code which allowed intubation to take place ---pt extubated herself this am, but was re-intubated promptly ---she then re-extubated herself and now she is tearing off her BIPAP mask ---she is requiring restraints and sedation now  ---she says she 'wants to live ' But does not want that mask! COPD/ asthma ----nonsmoker but exposed to passive smoke ----supposedly on home O2 but she was not using it ever per the [facility] who know her well Bilateral Pleural Effusions Elevated LFTs ---GI consult obtained (AST and ALT both >1k ---now near or at nl) ---Deemed to be 'shock liver' likely from ischemia / hypoxia. Acute renal failure ---now nl creatinine  H/O breast cancer S/p radiation Right breast in 2006 DJD CAD Question of Early Dementia---PACE program doctors state pt does not have a diagnosed dementia ----BUT the Sheffields say she would forget to pay her bills and was 'forgetful' recently --so      They started paying her bills on time for her and also reminding her of deadlines etc.   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  ----she could prepare some of her own meals and was 'pretty functional' Question of limited cognitive ability. ----The Sheffields state the entire family had 'low IQ' and pt herself was pretty simple though functional Mild Cardiomyopathy with EF 50% Leukocytosis Anemia --unclear etiology Mild Thrombocytopenia Probably dysphagia  CODE STATUS: Full code   PAST MEDICAL HISTORY: Past Medical History  Diagnosis Date  . Cancer [facility])     right breast cancer  . S/P radiation therapy     right breast cancer    PAST SURGICAL HISTORY:  Past Surgical History  Procedure Laterality Date  . Abdominal hysterectomy    . Breast excisional biopsy Right     positive 2006  . Breast biopsy Left     negative 02/2012 stereotactic    Vital Signs: BP 116/74 mmHg  Pulse 90   Temp(Src) 98.3 F (36.8 C) (Axillary)  Resp 18  Ht _0  (1.651 m)  Wt 80.4 kg (177 lb 4 oz)  BMI 29.50 kg/m2  SpO2 61% Filed Weights   03/14/15 0500 03/15/15 0454 03/16/15 0538  Weight: 79.6 kg (175 lb 7.8 oz) 79.7 kg (175 lb 11.3 oz) 80.4 kg (177 lb 4 oz)    Estimated body mass index is 29.5 kg/(m^2) as calculated from the following:   Height as of this encounter: _1  (1.651 m).   Weight as of this encounter: 80.4 kg (177 lb 4 oz).  PHYSICAL EXAM: Intermittently restless and pulling off the BIPAP mas earlier Now on Ventimask --she keeps taking this off. .   Has been on Precedex (off and on) EOMI --but eyelids are weak  No JVD seen Head is bent down and she leans her head to her right  She had pulled her ventimask during my  first visit and I had to replace it b/c she was becoming cyanotic (quickly) Abd soft and NT Skin pale and dry       LABS: CBC:    Component Value Date/Time   WBC 11.2* 03/16/2015 0424   HGB 11.0* 03/16/2015 0424   HCT 34.2* 03/16/2015 0424   PLT 117* 03/16/2015 0424   MCV 96.8 03/16/2015 0424   NEUTROABS 10.4* 02/14/2015 1300   LYMPHSABS 0.8* 03/06/2015 1300   MONOABS 0.9 02/28/2015 1300   EOSABS 0.0 03/04/2015 1300   BASOSABS 0.0 03/10/2015 1300   Comprehensive Metabolic Panel:    Component Value Date/Time   NA 151* 03/16/2015 0424   K 4.6 03/16/2015 0424   CL 109 03/16/2015 0424   CO2 39* 03/16/2015 0424   BUN 36* 03/16/2015 0424   CREATININE 0.86 03/16/2015 0424   GLUCOSE 91 03/16/2015 0424   CALCIUM 8.0* 03/16/2015 0424   AST 33 03/14/2015 0459   ALT 180* 03/14/2015 0459   ALKPHOS 80 03/14/2015 0459   BILITOT 0.6 03/14/2015 0459   PROT 5.4* 03/14/2015 0459   ALBUMIN 2.4* 03/15/2015 0641    More than 50% of the visit was spent in counseling/coordination of care: YES  Time Spent:  35 min

## 2015-03-16 NOTE — Care Management (Addendum)
HCPOA brought patient's MOST form.  Discussion with palliative care resulted in plan to monitor patient for one week and will at that time revisit code status.  Patient self extubated herself x 2 on 03/15/2015

## 2015-03-16 NOTE — Clinical Social Work Note (Addendum)
Clinical Social Work Assessment  Patient Details  Name: TANJANIKA SJOBERG MRN: [id number] Date of Birth: 1940/11/24  Date of referral:  03/06/15               Reason for consult:  Facility Placement, Family Concerns, Discharge Planning                Permission sought to share information with:    Permission granted to share information::     Name::        Agency::     Relationship::     Contact Information:     Housing/Transportation Living arrangements for the past 2 months:  Single Family Home Source of Information:  Other (Comment Required) (PACE ) Patient Interpreter Needed:  None Criminal Activity/Legal Involvement Pertinent to Current Situation/Hospitalization:  No - Comment as needed Significant Relationships:  Church, Other(Comment) (PACE) Lives with:  Self Do you feel safe going back to the place where you live?  Yes Need for family participation in patient care:  Yes (Comment)  Care giving concerns:  Patient is followed by PACE and lives alone in [place].    Social Worker assessment / plan:  Patient was admitted to the hospital on 03/04/2015. Clinical Social Worker (CSW) received SNF consult and signed off on 03/06/15 because PT recommended home health. Patient has been on several units including 2A, 1A and is currently in ICU. Patient is followed by PACE. Per chart patient is not alert and oriented. CSW attempted to call patient's HPOA Jasper Riling at [phone]. Nobody answered and a voicemail was left. CSW contacted PACE and spoke with patient's Education officer, museum. Per PACE social worker plan is for patient to go to [facility] when stable for D/C. CSW made PACE social worker aware that PASARR and FL2 have been completed. CSW made social worker aware that when patient was on 1A she verbalized to the nurses that she wanted to go home. Per social worker patient cannot return home and they will speak with her about going to [facility]. CSW will continue to follow and assist  as needed.    CSW received call back from patient's HPOA [facility]. Lars Mage is in agreement with patient going to [facility] when stable for D/C.   Employment status:  Retired Forensic scientist:  Other (Comment Required) (PACE) PT Recommendations:  Not assessed at this time Information / Referral to community resources:  [place]  Patient/Family's Response to care:  PACE stated that patient will go to [facility].   Patient/Family's Understanding of and Emotional Response to Diagnosis, Current Treatment, and Prognosis:  CSW could not get in touch with patient's HPOA. Patient is not oriented per chart.   Emotional Assessment Appearance:  Appears stated age Attitude/Demeanor/Rapport:  Unable to Assess Affect (typically observed):  Unable to Assess Orientation:  Oriented to Self, Fluctuating Orientation (Suspected and/or reported Sundowners) Alcohol / Substance use:  Not Applicable Psych involvement (Current and /or in the community):  No (Comment)  Discharge Needs  Concerns to be addressed:  Discharge Planning Concerns Readmission within the last 30 days:  No Current discharge risk:  Dependent with Mobility, Chronically ill Barriers to Discharge:  Continued Medical Work up   Loralyn Freshwater, LCSW 03/16/2015, 2:28 PM

## 2015-03-16 NOTE — Progress Notes (Signed)
Nutrition Follow-up   INTERVENTION:   Meals and Snacks: Cater to patient preferences on Dysphagia II, Nectar thick; SLP following, RD made SLP aware this am that pt back on diet today. Will discontinue TF order as pt on po diet order, s/p extubation Medical Food Supplement Therapy: Continue supplements as ordered, Mighty Shakes TID and Magic Cup BID   NUTRITION DIAGNOSIS:   Inadequate oral intake related to inability to eat as evidenced by NPO status, improved with diet advancement  GOAL:   Patient will meet greater than or equal to 90% of their needs; ongoing  MONITOR:    (Energy Intake, Anthropometrics, Pulmonary Profile, Digestive System)  REASON FOR ASSESSMENT:   LOS    ASSESSMENT:   Pt admitted with NSTEMI, recently transferred to ICU on bipap for respiratory acidosis.  Pt self-extubated times 2 yesterday, remained extubated this am.   Diet Order:  DIET DYS 2 Room service appropriate?: Yes; Fluid consistency:: Nectar Thick    Current Nutrition: Per RN Di Kindle, pt did not receive TF yesterday secondary to self-extubations. Diet Order just advanced this am to Dysphagia II, Nectar thick liquids. Pt had not received a tray yet on visit. Pt asking for nectar thick water on rounds. Per chart review pt was eating 81% on average 1/23-1/26.   Gastrointestinal Profile: Last BM: 03/15/2015   Scheduled Medications:  . acetylcysteine  3 mL Nebulization BID  . antiseptic oral rinse  7 mL Mouth Rinse q12n4p  . antiseptic oral rinse  7 mL Mouth Rinse QID  . aspirin EC  81 mg Oral Daily  . budesonide (PULMICORT) nebulizer solution  0.5 mg Nebulization BID  . chlorhexidine gluconate  15 mL Mouth Rinse BID  . cholecalciferol  1,000 Units Oral Daily  . clopidogrel  75 mg Oral Daily  . docusate sodium  100 mg Oral BID  . DULoxetine  60 mg Oral Daily  . enoxaparin (LOVENOX) injection  40 mg Subcutaneous Q24H  . famotidine (PEPCID) IV  20 mg Intravenous Q12H  . free water  100 mL  Per Tube 3 times per day  . ipratropium-albuterol  3 mL Nebulization Q4H  . lactulose  30 g Oral BID  . levothyroxine  175 mcg Oral QHS  . loratadine  10 mg Oral Daily  . meropenem (MERREM) IV  1 g Intravenous 3 times per day  . metoCLOPramide  5 mg Oral TID AC & HS  . mirtazapine  7.5 mg Oral QHS  . phenytoin (DILANTIN) IV  100 mg Intravenous 3 times per day  . [START ON 03/17/2015] predniSONE  60 mg Oral Q breakfast   Followed by  . [START ON 03/18/2015] predniSONE  50 mg Oral Q breakfast   Followed by  . [START ON 03/19/2015] predniSONE  40 mg Oral Q breakfast   Followed by  . [START ON 03/20/2015] predniSONE  30 mg Oral Q breakfast   Followed by  . [START ON 03/21/2015] predniSONE  20 mg Oral Q breakfast   Followed by  . [START ON 03/22/2015] predniSONE  10 mg Oral Q breakfast  . senna  2 tablet Oral QHS  . sodium chloride  3 mL Intravenous Q12H  . traZODone  50 mg Oral QHS    Continuous Medications:  . sodium chloride 75 mL/hr at 03/16/15 1211  . dexmedetomidine 0.4 mcg/kg/hr (03/16/15 0840)  . norepinephrine (LEVOPHED) Adult infusion Stopped (03/15/15 1330)     Electrolyte/Renal Profile and Glucose Profile:   Recent Labs Lab 03/14/15 0459 03/15/15 [id number]  03/16/15 0424  NA 148* 144 151*  K 4.6 3.7 4.6  CL 106 105 109  CO2 37* 31 39*  BUN 29* 33* 36*  CREATININE 0.94 0.85 0.86  CALCIUM 8.3* 7.9* 8.0*  MG  --   --  2.0  PHOS  --   --  2.7  GLUCOSE 96 83 91   Protein Profile:   Recent Labs Lab 03/10/15 0826 03/14/15 0459 03/15/15 0641  ALBUMIN 3.2* 2.7* 2.4*      Weight Trend since Admission: Filed Weights   03/14/15 0500 03/15/15 0454 03/16/15 0538  Weight: 175 lb 7.8 oz (79.6 kg) 175 lb 11.3 oz (79.7 kg) 177 lb 4 oz (80.4 kg)     BMI:  Body mass index is 29.5 kg/(m^2).  Estimated Nutritional Needs:   Kcal:  BEE: 1245kcals, TEE: (IF 1.1-1.3)(AF 1.2) 1646-1945kcals  Protein:  96-120 g (1.2-1.5 g/kg)   Fluid:  2000-2400 mL (25-30 ml/kg)   EDUCATION  NEEDS:   Education needs no appropriate at this time   Morrison, RD, LDN Pager [phone] Weekend/On-Call Pager [phone]

## 2015-03-16 DEATH — deceased

## 2015-03-17 ENCOUNTER — Inpatient Hospital Stay: Payer: Medicare (Managed Care)

## 2015-03-17 LAB — CBC
HEMATOCRIT: 33.7 % — AB (ref 35.0–47.0)
HEMOGLOBIN: 10.6 g/dL — AB (ref 12.0–16.0)
MCH: 30.4 pg (ref 26.0–34.0)
MCHC: 31.4 g/dL — AB (ref 32.0–36.0)
MCV: 96.9 fL (ref 80.0–100.0)
Platelets: 109 10*3/uL — ABNORMAL LOW (ref 150–440)
RBC: 3.48 MIL/uL — ABNORMAL LOW (ref 3.80–5.20)
RDW: 15.8 % — ABNORMAL HIGH (ref 11.5–14.5)
WBC: 11.2 10*3/uL — AB (ref 3.6–11.0)

## 2015-03-17 LAB — GLUCOSE, CAPILLARY
GLUCOSE-CAPILLARY: 115 mg/dL — AB (ref 65–99)
GLUCOSE-CAPILLARY: 84 mg/dL (ref 65–99)
GLUCOSE-CAPILLARY: 92 mg/dL (ref 65–99)
Glucose-Capillary: 151 mg/dL — ABNORMAL HIGH (ref 65–99)
Glucose-Capillary: 189 mg/dL — ABNORMAL HIGH (ref 65–99)

## 2015-03-17 LAB — CULTURE, RESPIRATORY
CULTURE: NO GROWTH
SPECIAL REQUESTS: NORMAL

## 2015-03-17 LAB — BLOOD GAS, ARTERIAL
ACID-BASE EXCESS: 8.8 mmol/L — AB (ref 0.0–3.0)
Bicarbonate: 36.7 mEq/L — ABNORMAL HIGH (ref 21.0–28.0)
Expiratory PAP: 6
FIO2: 0.7
Inspiratory PAP: 16
MECHANICAL RATE: 12
O2 SAT: 96.1 %
PATIENT TEMPERATURE: 37
pCO2 arterial: 65 mmHg — ABNORMAL HIGH (ref 32.0–48.0)
pH, Arterial: 7.36 (ref 7.350–7.450)
pO2, Arterial: 86 mmHg (ref 83.0–108.0)

## 2015-03-17 LAB — BASIC METABOLIC PANEL
ANION GAP: 3 — AB (ref 5–15)
BUN: 33 mg/dL — ABNORMAL HIGH (ref 6–20)
CO2: 35 mmol/L — ABNORMAL HIGH (ref 22–32)
Calcium: 7.7 mg/dL — ABNORMAL LOW (ref 8.9–10.3)
Chloride: 113 mmol/L — ABNORMAL HIGH (ref 101–111)
Creatinine, Ser: 0.78 mg/dL (ref 0.44–1.00)
GFR calc Af Amer: >60 mL/min (ref >60)
GLUCOSE: 92 mg/dL (ref 65–99)
POTASSIUM: 3.8 mmol/L (ref 3.5–5.1)
Sodium: 151 mmol/L — ABNORMAL HIGH (ref 135–145)

## 2015-03-17 LAB — CALCIUM, IONIZED: Calcium, Ionized, Serum: 4.5 mg/dL (ref 4.5–5.6)

## 2015-03-17 MED ORDER — DEXTROSE-NACL 5-0.45 % IV SOLN: INTRAVENOUS | Status: DC

## 2015-03-17 MED ORDER — SODIUM CHLORIDE 0.9 % IV SOLN: INTRAVENOUS | Status: DC

## 2015-03-17 MED ORDER — DEXTROSE-NACL 5-0.45 % IV SOLN
INTRAVENOUS | Status: DC
  Administered 2015-03-19 (×3): via INTRAVENOUS

## 2015-03-17 MED ORDER — INSULIN REGULAR BOLUS VIA INFUSION
0.0000 [IU] | Freq: Three times a day (TID) | INTRAVENOUS | Status: DC
Start: 2015-03-17 — End: 2015-03-17

## 2015-03-17 MED ORDER — DEXTROSE-NACL 5-0.9 % IV SOLN: INTRAVENOUS | Status: DC

## 2015-03-17 MED ORDER — LORAZEPAM 2 MG/ML IJ SOLN
1.0000 mg | Freq: Four times a day (QID) | INTRAMUSCULAR | Status: DC | PRN
  Administered 2015-03-20: 2 mg via INTRAVENOUS
  Filled 2015-03-17: qty 1

## 2015-03-17 MED ORDER — DEXTROSE 50 % IV SOLN: 25.0000 mL | INTRAVENOUS | Status: DC | PRN

## 2015-03-17 MED ORDER — ALPRAZOLAM 0.25 MG PO TABS
0.2500 mg | ORAL_TABLET | Freq: Three times a day (TID) | ORAL | Status: DC
  Administered 2015-03-17 – 2015-03-21 (×10): 0.25 mg via ORAL
  Filled 2015-03-17 (×10): qty 1

## 2015-03-17 MED ORDER — SODIUM CHLORIDE 0.9 % IV BOLUS (SEPSIS)
500.0000 mL | Freq: Once | INTRAVENOUS | Status: AC
  Administered 2015-03-17: 500 mL via INTRAVENOUS

## 2015-03-17 NOTE — Progress Notes (Signed)
[place] [facility] [place] NAME: Julie Anthony    MR#:  [id number]  DATE OF BIRTH:  [birth year]  SUBJECTIVE:  Had episode of hypotension and desaturations morning had been taken off Precedex On BiPAP this morning saturating well Unable to provide meaningful information given mental status medical condition  REVIEW OF SYSTEMS:  Unable to provide meaningful information given mental status medical condition at this time  DRUG ALLERGIES:   Allergies  Allergen Reactions  . Penicillins Rash  . Codeine Other (See Comments)    Unknown reaction.  . Ceftin [Cefuroxime Axetil] Hives    Unknown reaction  . Erythromycin Other (See Comments)    Unknown reaction  . Ibuprofen Other (See Comments)    Unknown reaction  . Sulfa Antibiotics Other (See Comments)    Unknown reaction    VITALS:  Blood pressure 134/73, pulse 100, temperature 99.1 F (37.3 C), temperature source Axillary, resp. rate 21, height 5\' 5"  (1.651 m), weight 84.9 kg (187 lb 2.7 oz), SpO2 87 %.  PHYSICAL EXAMINATION:   VITAL SIGNS: Filed Vitals:   03/17/15 0924 03/17/15 1000  BP:  134/73  Pulse: 93 100  Temp:    Resp: 21 21   GENERAL:75 y.o.female moderate distress given mental status/respiratory status. Currently on BiPAP HEAD: Normocephalic, atraumatic.  EYES: Pupils equal, round, reactive to light. Unable to assess extraocular muscles given mental status/medical condition. No scleral icterus.  MOUTH: Moist mucosal membrane. Dentition intact. No abscess noted.  EAR, NOSE, THROAT: Clear without exudates. No external lesions.  NECK: Supple. No thyromegaly. No nodules. No JVD.  PULMONARY: Coarse breath sounds throughout without wheeze. No use of accessory muscles, poor respiratory effort. good air entry bilaterally CHEST: Nontender to palpation.  CARDIOVASCULAR: S1 and S2. Regular rate and rhythm. No murmurs, rubs, or gallops. No edema. Pedal pulses 2+ bilaterally.   GASTROINTESTINAL: Soft, nontender, nondistended. No masses. Positive bowel sounds. No hepatosplenomegaly.  MUSCULOSKELETAL: No swelling, clubbing, or edema. Range of motion full in all extremities.  NEUROLOGIC: Unable to assess given mental status/medical condition SKIN: No ulceration, lesions, rashes, or cyanosis. Skin warm and dry. Turgor intact.  PSYCHIATRIC: Unable to assess given mental status/medical condition        LABORATORY PANEL:   CBC  Recent Labs Lab 03/17/15 0444  WBC 11.2*  HGB 10.6*  HCT 33.7*  PLT 109*   ------------------------------------------------------------------------------------------------------------------  Chemistries   Recent Labs Lab 03/14/15 0459  03/16/15 0424 03/17/15 0444  NA 148*  < > 151* 151*  K 4.6  < > 4.6 3.8  CL 106  < > 109 113*  CO2 37*  < > 39* 35*  GLUCOSE 96  < > 91 92  BUN 29*  < > 36* 33*  CREATININE 0.94  < > 0.86 0.78  CALCIUM 8.3*  < > 8.0* 7.7*  MG  --   --  2.0  --   AST 33  --   --   --   ALT 180*  --   --   --   ALKPHOS 80  --   --   --   BILITOT 0.6  --   --   --   < > = values in this interval not displayed. ------------------------------------------------------------------------------------------------------------------  Cardiac Enzymes No results for input(s): TROPONINI in the last 168 hours. ------------------------------------------------------------------------------------------------------------------  RADIOLOGY:  Dg Chest 1 View  03/17/2015  CLINICAL DATA:  75 year old female with dyspnea. Cancer post radiation therapy. Subsequent encounter. EXAM: CHEST  1 VIEW COMPARISON:  03/16/2015 FINDINGS: Cardiomegaly.  Calcified tortuous aorta. Slight improved aeration left perihilar region with persistent asymmetric airspace disease greater left perihilar region and lung bases. Question infiltrate, atelectasis, tumor and/ or posteriorly layering pleural effusion. No gross pneumothorax. Right PICC line is in  place. Tip seen to level of the mid superior vena cava. IMPRESSION: Slight improved aeration left perihilar region with remainder of findings without change as detailed above. Electronically Signed   By: Genia Del M.D.   On: 03/17/2015 07:35   Dg Chest 1 View  03/16/2015  CLINICAL DATA:  Shortness of breath. EXAM: CHEST 1 VIEW COMPARISON:  03/15/2015, 03/15/2015, 03/11/2015. FINDINGS: Right PICC line in stable position. Stable cardiomegaly. Persistent dense left lung infiltrate. Interim slight improvement right lung infiltrate. Bilateral pleural effusions. No pneumothorax. Surgical clips right axilla . IMPRESSION: 1. Right PICC line in stable position. 2. Persistent dense diffuse left lung infiltrate. No interim change.Slight partial clearing of right lung infiltrate. Persistent small bilateral pleural effusions. 3. Stable cardiomegaly. Electronically Signed   By: Marcello Moores  Register   On: 03/16/2015 07:46    EKG:   Orders placed or performed during the hospital encounter of 03/14/2015  . ED EKG  . ED EKG    ASSESSMENT AND PLAN:  75 year old Caucasian female with chronic respiratory failure on 2 L of oxygen through nasal cannula at home, who lives alone presents to the hospital with complaints of cough, chest congestion as well as shortness of breath. Admitted 02/19/2015  1. Acute on chronic respiratory failure with hypoxia and hypercapnia due to acute exacerbation of COPD, left lower lobe pneumonia, concerning for aspiration pneumonia, Meropenem completed today  BiPAP as required with goal of needing only nighttime BiPAP  Speech therapy- as per RNs report patient is tolerating thickened liquids at this time without any aspiration. Discussed with speech therapy. Their concern is that patient is regurgitating a lot which would precipitate aspiration 2. Hypernatremia: Still remains an issue we'll increase half-normal saline ideally patient would be receiving free water flushes but has no adequate means  of oral access 3. Acute kidney injury resolved 3. Acute renal failure, resolved    4. Non-Q-wave MI, type 2, due to demand ischemia, per cardiologist, continue aspirin, heparin given , nitrates, atenolol, no intervention was recommended  5. Elevated transaminases , likely due to statin, improvedFollowing LFTs intermittently  6. Venous thromboembolism prophylactic: Lovenox  Disposition: Appreciate provide care input, it seems the family wishes patient to remain full code for 1 week duration, if no improvement at that time further decisions will be made. They do not wish to pursue trach/PEG option  Care management/ palliative care input noted    All the records are reviewed and case discussed with Care Management/Social Workerr. Management plans discussed with the patient, family and they are in agreement.  CODE STATUS: full  TOTAL TIME TAKING CARE OF THIS PATIENT: 33 minutes.   POSSIBLE D/C IN 5-7 DAYS, DEPENDING ON CLINICAL CONDITION.   Hower,  Karenann Cai.D on 03/17/2015 at 2:41 PM  Between 7am to 6pm - Pager - [phone]  After 6pm: House Pager: - [place] [facility]  Office  [phone]  CC: Primary care physician; [place]

## 2015-03-17 NOTE — Progress Notes (Signed)
Speech Therapy Note: reviewed chart notes; consulted NSG re: pt's status. Pt has begun the Dys. 2 diet w/ Nectar liquids rec'd post respiratory decline and transfer to CCU. Pt has poor respiratory status at baseline and has experienced regurgitation of reflux material during this hospital stay which could have impacted her overall respiratory and medical status'. Pt remains confused but is able to interact re: basic areas of her life including her Dog.  Rec. Pt remain on a dysphagia diet during this time of declined medical and respiratory status'; strict aspiration precautions; meds in puree. Rec. continued f/u w/ Palliative care to determine pt's wishes for POC as she has asked for "water". ST will be available for further assessment and education as necessary.

## 2015-03-17 NOTE — Progress Notes (Signed)
Palliative Care Update   I am continuing to follow pt --but the plan per her HCPOA is for pt to have ongoing aggressive care for 'about a week' (the week started on 03/15/15).  Pt had low BP early this am when taken off of Precedex --she required a bolus of fluids and now her BP is nl .  I came in and found her on well-taped and stabilized Hi Flow nasal cannulas --but unfortunately, the nasal cannulas were both out of her nose --over to her cheek just enough to not be going into her nostrils.  I fixed this myself.  Five minutes later, her nurse found pt had done it again.  Pt says, "I don't want it."  She is confused.  Not rational at all. Asks for water.  Knows about her dog.  But is not logical at all otherwise.    I will continue to follow along and get more involved after a week's time or if pt deteriorates drastically before that time.     Colleen Can, MD

## 2015-03-17 NOTE — Progress Notes (Signed)
PULMONARY / CRITICAL CARE MEDICINE   Name: CARRAH BOYNES MRN: [id number] DOB: 12-12-1940    ADMISSION DATE:  02/21/2015    ASSESSMENT / PLAN: 75 yo female with PMHx of COPD on 2L, Dementia, CAD, admitted for NSTEMI and AECOPD.  Noted to have LLL opacity, worsening respiratory distress on biPAP  PULMONARY 1.AECOPD - The patient's COPD exacerbation appears to have improved, she has been transitioned to oral prednisone taper. -Continue BiPAP at night and as needed, transitioned to high flow nasal cannula during the day. -Continue nebs with Mucomyst to promote airway clearance, as the patient had significant left-sided mucous plugging.    2.LLL opacity c/w pneumonia with mucus plugs.  -. Chest x-ray from2022017 and images reviewed, shows continued left-sided pneumonia, with significant improvement from previous films.   3.Multifocal Pneumonia - the patient has completed a 10 day course of meropenem, and has been on antibiotics for a total of 13 days. -The patient has significant radiographic improvement, but has not progressed clinically despite completing a course of antibiotics. She continues to have severe hypoxemia.  -. Should the patient decompensate once again and require intubation, she will likely require tracheostomy if continued aggressive care is desired.  4.NSTEMI - CE if needed - cont with asa, heparin, nitrates, BP control  GASTRO: Continue GI prophylaxis, continue bowel regimen.  INFECTIOUS: --Pneumonia, completing 10 day course of antibiotics today.  Respiratory culture 03/14/2015: Negative. MRSA screen negative. Blood culture 02/14/2015: Negative.  Antibiotics: Meropenem 03/09/2015 >>   STUDIES:  1/23 CT Chest - Left upper and left lower lobe pulmonary opacities, suspicious for infection or aspiration. Fluid in the left endobronchial tree could relate to mucous plugging or aspiration. Enlarged PA.  SIGNIFICANT EVENTS: Intubated 03/14/2015. Self  extubated 2 on 03/15/2015.   CULTURES: Bld Cx x 2 1/21 >>negative to date MRSA >>negative Sputum Cx 1/22>> negative  sputum culture 1/30>> negative  ANTIBIOTICS Levaquin 1/21>>1/24 Meropenem 1/24>> 2/02  Vanc 1/21>>1/24 Aztreonam 1/21>>1/24  ENDOCRINE:  -Blood glucose controlled  SOCIAL: -Palliative care following, patient enrolled in pace and as a healthcare power of attorney. They would like to continue the patient has full code and monitor the patient's progress over the next week. -Patient has self extubated 2, she she appears to have reached a plateau in her condition, she does not appear to be making significant progress.   VITAL SIGNS: Temp:  [97.6 F (36.4 C)-99.1 F (37.3 C)] 99.1 F (37.3 C) (02/02 0755) Pulse Rate:  [68-100] 100 (02/02 1000) Resp:  [17-34] 21 (02/02 1000) BP: (79-134)/(46-83) 134/73 mmHg (02/02 1000) SpO2:  [82 %-100 %] 87 % (02/02 1000) FiO2 (%):  [55 %-90 %] 90 % (02/02 1145) Weight:  [187 lb 2.7 oz (84.9 kg)] 187 lb 2.7 oz (84.9 kg) (02/02 0532) HEMODYNAMICS:   VENTILATOR SETTINGS: Vent Mode:  [-]  FiO2 (%):  [55 %-90 %] 90 % INTAKE / OUTPUT:  Intake/Output Summary (Last 24 hours) at 03/17/15 1502 Last data filed at 03/17/15 1115  Gross per 24 hour  Intake 2445.27 ml  Output    350 ml  Net 2095.27 ml    Review of Systems  Unable to perform ROS: critical illness  Respiratory: Positive for shortness of breath and wheezing.     Physical Exam  Constitutional: She appears distressed.  HENT:  Head: Normocephalic and atraumatic.  Eyes: Pupils are equal, round, and reactive to light.  Neck: Normal range of motion. Neck supple.  Cardiovascular: Normal rate, regular rhythm and normal heart sounds.  No murmur heard. Pulmonary/Chest: She is in respiratory distress. She has wheezes. She has rales.  Abdominal: Soft. Bowel sounds are normal.  Musculoskeletal: Normal range of motion.  Neurological:  lethargic  Skin: Skin is warm.  She is diaphoretic.  Nursing note and vitals reviewed.    LABS:  CBC  Recent Labs Lab 03/15/15 0641 03/16/15 0424 03/17/15 0444  WBC 14.4* 11.2* 11.2*  HGB 11.4* 11.0* 10.6*  HCT 35.3 34.2* 33.7*  PLT 138* 117* 109*   Coag's No results for input(s): APTT, INR in the last 168 hours. BMET  Recent Labs Lab 03/15/15 0641 03/16/15 0424 03/17/15 0444  NA 144 151* 151*  K 3.7 4.6 3.8  CL 105 109 113*  CO2 31 39* 35*  BUN 33* 36* 33*  CREATININE 0.85 0.86 0.78  GLUCOSE 83 91 92   Electrolytes  Recent Labs Lab 03/15/15 0641 03/16/15 0424 03/17/15 0444  CALCIUM 7.9* 8.0* 7.7*  MG  --  2.0  --   PHOS  --  2.7  --    Sepsis Markers No results for input(s): LATICACIDVEN, PROCALCITON, O2SATVEN in the last 168 hours. ABG  Recent Labs Lab 03/15/15 1320 03/16/15 0431 03/17/15 0346  PHART 7.40 7.37 7.36  PCO2ART 57* 66* 65*  PO2ART 98 74* 86   Liver Enzymes  Recent Labs Lab 03/14/15 0459 03/15/15 0641  AST 33  --   ALT 180*  --   ALKPHOS 80  --   BILITOT 0.6  --   ALBUMIN 2.7* 2.4*   Cardiac Enzymes No results for input(s): TROPONINI, PROBNP in the last 168 hours. Glucose  Recent Labs Lab 03/16/15 1951 03/16/15 2050 03/17/15 0031 03/17/15 0342 03/17/15 0749 03/17/15 1149  GLUCAP 68 136* 115* 92 84 151*    Imaging Dg Chest 1 View  03/17/2015  CLINICAL DATA:  75 year old female with dyspnea. Cancer post radiation therapy. Subsequent encounter. EXAM: CHEST 1 VIEW COMPARISON:  03/16/2015 FINDINGS: Cardiomegaly.  Calcified tortuous aorta. Slight improved aeration left perihilar region with persistent asymmetric airspace disease greater left perihilar region and lung bases. Question infiltrate, atelectasis, tumor and/ or posteriorly layering pleural effusion. No gross pneumothorax. Right PICC line is in place. Tip seen to level of the mid superior vena cava. IMPRESSION: Slight improved aeration left perihilar region with remainder of findings without  change as detailed above. Electronically Signed   By: Genia Del M.D.   On: 03/17/2015 07:35       Deep Katrinia Straker M.D.  Critical Care Attestation.  I have personally obtained a history, examined the patient, evaluated laboratory and imaging results, formulated the assessment and plan and placed orders. The Patient requires high complexity decision making for assessment and support, frequent evaluation and titration of therapies, application of advanced monitoring technologies and extensive interpretation of multiple databases. The patient has critical illness that could lead imminently to failure of 1 or more organ systems and requires the highest level of physician preparedness to intervene.  Critical Care Time devoted to patient care services described in this note is 35 minutes and is exclusive of time spent in procedures.

## 2015-03-17 NOTE — Progress Notes (Signed)
[place] NOTE  Pharmacy Consult for Electrolyte and Phenytoin Monitoring      Allergies  Allergen Reactions  . Penicillins Rash  . Codeine Other (See Comments)    Unknown reaction.  . Ceftin [Cefuroxime Axetil] Hives    Unknown reaction  . Erythromycin Other (See Comments)    Unknown reaction  . Ibuprofen Other (See Comments)    Unknown reaction  . Sulfa Antibiotics Other (See Comments)    Unknown reaction    Patient Measurements: Height: 5\' 5"  (165.1 cm) Weight: 187 lb 2.7 oz (84.9 kg) IBW/kg (Calculated) : 57  Vital Signs: Temp: 99.1 F (37.3 C) (02/02 0755) Temp Source: Axillary (02/02 0755) BP: 134/73 mmHg (02/02 1000) Pulse Rate: 100 (02/02 1000) Intake/Output from previous day: 02/01 0701 - 02/02 0700 In: 2179.4 [I.V.:1929.4; IV Piggyback:250] Out: 500 [Urine:500] Intake/Output from this shift: Total I/O In: 328.3 [I.V.:328.3] Out: -  Vent settings for last 24 hours: Vent Mode:  [-]  FiO2 (%):  [55 %-90 %] 90 %  Labs:  Recent Labs  03/15/15 0641 03/16/15 0424 03/17/15 0444  WBC 14.4* 11.2* 11.2*  HGB 11.4* 11.0* 10.6*  HCT 35.3 34.2* 33.7*  PLT 138* 117* 109*  CREATININE 0.85 0.86 0.78  MG  --  2.0  --   PHOS  --  2.7  --   ALBUMIN 2.4*  --   --    Estimated Creatinine Clearance: 64.4 mL/min (by C-G formula based on Cr of 0.78).   Recent Labs  03/17/15 0342 03/17/15 0749 03/17/15 1149  GLUCAP 92 36 151*    Microbiology: Recent Results (from the past 720 hour(s))  Culture, blood (Routine X 2) w Reflex to ID Panel     Status: None   Collection Time: 03/09/2015  1:00 PM  Result Value Ref Range Status   Specimen Description BLOOD LEFT ANTECUBITAL  Final   Special Requests BOTTLES DRAWN AEROBIC AND ANAEROBIC  1CC  Final   Culture NO GROWTH 5 DAYS  Final   Report Status 03/10/2015 FINAL  Final  Culture, blood (Routine X 2) w Reflex to ID Panel     Status: None   Collection Time: 03/02/2015  1:04 PM  Result Value Ref  Range Status   Specimen Description BLOOD LEFT HAND  Final   Special Requests BOTTLES DRAWN AEROBIC AND ANAEROBIC  1CC  Final   Culture NO GROWTH 5 DAYS  Final   Report Status 03/10/2015 FINAL  Final  MRSA PCR Screening     Status: None   Collection Time: 03/07/15  5:32 PM  Result Value Ref Range Status   MRSA by PCR NEGATIVE NEGATIVE Final    Comment:        The GeneXpert MRSA Assay (FDA approved for NASAL specimens only), is one component of a comprehensive MRSA colonization surveillance program. It is not intended to diagnose MRSA infection nor to guide or monitor treatment for MRSA infections.   MRSA PCR Screening     Status: None   Collection Time: 03/11/15 11:20 AM  Result Value Ref Range Status   MRSA by PCR NEGATIVE NEGATIVE Final    Comment:        The GeneXpert MRSA Assay (FDA approved for NASAL specimens only), is one component of a comprehensive MRSA colonization surveillance program. It is not intended to diagnose MRSA infection nor to guide or monitor treatment for MRSA infections.   Culture, respiratory (NON-Expectorated)     Status: None   Collection Time: 03/14/15  9:28 PM  Result Value Ref Range Status   Specimen Description TRACHEAL ASPIRATE  Final   Special Requests Normal  Final   Gram Stain MODERATE WBC SEEN NO ORGANISMS SEEN   Final   Culture NO GROWTH 3 DAYS  Final   Report Status 03/17/2015 FINAL  Final    Medications:  Scheduled:  . acetylcysteine  3 mL Nebulization BID  . ALPRAZolam  0.25 mg Oral TID  . antiseptic oral rinse  7 mL Mouth Rinse q12n4p  . antiseptic oral rinse  7 mL Mouth Rinse QID  . aspirin EC  81 mg Oral Daily  . budesonide (PULMICORT) nebulizer solution  0.5 mg Nebulization BID  . chlorhexidine gluconate  15 mL Mouth Rinse BID  . cholecalciferol  1,000 Units Oral Daily  . clopidogrel  75 mg Oral Daily  . docusate sodium  100 mg Oral BID  . DULoxetine  60 mg Oral Daily  . enoxaparin (LOVENOX) injection  40 mg  Subcutaneous Q24H  . famotidine (PEPCID) IV  20 mg Intravenous Q12H  . ipratropium-albuterol  3 mL Nebulization Q4H  . lactulose  30 g Oral BID  . levothyroxine  175 mcg Oral QHS  . loratadine  10 mg Oral Daily  . meropenem (MERREM) IV  1 g Intravenous 3 times per day  . metoCLOPramide  5 mg Oral TID AC & HS  . mirtazapine  7.5 mg Oral QHS  . phenytoin (DILANTIN) IV  100 mg Intravenous 3 times per day  . [START ON 03/18/2015] predniSONE  50 mg Oral Q breakfast   Followed by  . [START ON 03/19/2015] predniSONE  40 mg Oral Q breakfast   Followed by  . [START ON 03/20/2015] predniSONE  30 mg Oral Q breakfast   Followed by  . [START ON 03/21/2015] predniSONE  20 mg Oral Q breakfast   Followed by  . [START ON 03/22/2015] predniSONE  10 mg Oral Q breakfast  . senna  2 tablet Oral QHS  . sodium chloride  3 mL Intravenous Q12H  . sodium chloride flush  10-40 mL Intracatheter Q12H  . traZODone  50 mg Oral QHS   Infusions:  . dexmedetomidine 0.4 mcg/kg/hr (03/17/15 0917)  . dextrose 5 % and 0.9% NaCl      Assessment: Pharmacy consulted to monitor electrolytes and phenytoin for [name].  Plan:  1. Phenytoin: Level 1/31 obtained 5.1, albumin 2.4, corrected phenytoin level 6.4. Phenytoin level is low, however patient has missed several doses of phenytoin due to inability to take PO. Will continue phenytoin 100 mg iv q 8 hours and check another level at steady-state.   2. Electrolytes: Electrolytes are within normal limits. Will recheck electrolytes with am labs.   Pharmacy will continue to monitor and adjust per consult.    Ulice Dash D 03/17/2015,12:37 PM

## 2015-03-18 ENCOUNTER — Inpatient Hospital Stay: Payer: Medicare (Managed Care)

## 2015-03-18 DIAGNOSIS — N179 Acute kidney failure, unspecified: Secondary | ICD-10-CM

## 2015-03-18 DIAGNOSIS — G9341 Metabolic encephalopathy: Secondary | ICD-10-CM

## 2015-03-18 DIAGNOSIS — D696 Thrombocytopenia, unspecified: Secondary | ICD-10-CM

## 2015-03-18 DIAGNOSIS — I129 Hypertensive chronic kidney disease with stage 1 through stage 4 chronic kidney disease, or unspecified chronic kidney disease: Secondary | ICD-10-CM

## 2015-03-18 DIAGNOSIS — J90 Pleural effusion, not elsewhere classified: Secondary | ICD-10-CM

## 2015-03-18 DIAGNOSIS — Z9911 Dependence on respirator [ventilator] status: Secondary | ICD-10-CM

## 2015-03-18 DIAGNOSIS — D72829 Elevated white blood cell count, unspecified: Secondary | ICD-10-CM

## 2015-03-18 DIAGNOSIS — J189 Pneumonia, unspecified organism: Secondary | ICD-10-CM | POA: Insufficient documentation

## 2015-03-18 DIAGNOSIS — J690 Pneumonitis due to inhalation of food and vomit: Secondary | ICD-10-CM

## 2015-03-18 DIAGNOSIS — J9621 Acute and chronic respiratory failure with hypoxia: Secondary | ICD-10-CM

## 2015-03-18 DIAGNOSIS — R7989 Other specified abnormal findings of blood chemistry: Secondary | ICD-10-CM

## 2015-03-18 DIAGNOSIS — J449 Chronic obstructive pulmonary disease, unspecified: Secondary | ICD-10-CM

## 2015-03-18 DIAGNOSIS — I214 Non-ST elevation (NSTEMI) myocardial infarction: Secondary | ICD-10-CM

## 2015-03-18 LAB — BLOOD GAS, ARTERIAL
ACID-BASE EXCESS: 7.1 mmol/L — AB (ref 0.0–3.0)
Allens test (pass/fail): POSITIVE — AB
Bicarbonate: 33.4 mEq/L — ABNORMAL HIGH (ref 21.0–28.0)
FIO2: 0.75
MECHVT: 450 mL
O2 SAT: 89.7 %
PCO2 ART: 54 mmHg — AB (ref 32.0–48.0)
PEEP: 5 cmH2O
PO2 ART: 58 mmHg — AB (ref 83.0–108.0)
Patient temperature: 37
RATE: 20 resp/min
pH, Arterial: 7.4 (ref 7.350–7.450)

## 2015-03-18 LAB — CBC
HCT: 35.6 % (ref 35.0–47.0)
Hemoglobin: 11.2 g/dL — ABNORMAL LOW (ref 12.0–16.0)
MCH: 31.2 pg (ref 26.0–34.0)
MCHC: 31.5 g/dL — AB (ref 32.0–36.0)
MCV: 99.1 fL (ref 80.0–100.0)
PLATELETS: 109 10*3/uL — AB (ref 150–440)
RBC: 3.59 MIL/uL — ABNORMAL LOW (ref 3.80–5.20)
RDW: 16.3 % — AB (ref 11.5–14.5)
WBC: 9.6 10*3/uL (ref 3.6–11.0)

## 2015-03-18 LAB — GLUCOSE, CAPILLARY
GLUCOSE-CAPILLARY: 199 mg/dL — AB (ref 65–99)
GLUCOSE-CAPILLARY: 68 mg/dL (ref 65–99)
Glucose-Capillary: 145 mg/dL — ABNORMAL HIGH (ref 65–99)
Glucose-Capillary: 140 mg/dL — ABNORMAL HIGH (ref 65–99)
Glucose-Capillary: 199 mg/dL — ABNORMAL HIGH (ref 65–99)
Glucose-Capillary: 152 mg/dL — ABNORMAL HIGH (ref 65–99)

## 2015-03-18 LAB — BASIC METABOLIC PANEL
ANION GAP: 4 — AB (ref 5–15)
BUN: 31 mg/dL — ABNORMAL HIGH (ref 6–20)
CALCIUM: 7.9 mg/dL — AB (ref 8.9–10.3)
CO2: 35 mmol/L — AB (ref 22–32)
CREATININE: 0.73 mg/dL (ref 0.44–1.00)
Chloride: 110 mmol/L (ref 101–111)
Glucose, Bld: 175 mg/dL — ABNORMAL HIGH (ref 65–99)
Potassium: 3.8 mmol/L (ref 3.5–5.1)
SODIUM: 149 mmol/L — AB (ref 135–145)

## 2015-03-18 LAB — PHOSPHORUS: PHOSPHORUS: 2.1 mg/dL — AB (ref 2.5–4.6)

## 2015-03-18 LAB — MAGNESIUM: MAGNESIUM: 1.7 mg/dL (ref 1.7–2.4)

## 2015-03-18 MED ORDER — VECURONIUM BROMIDE 10 MG IV SOLR
INTRAVENOUS | Status: AC
Start: 2015-03-18 — End: 2015-03-18
  Administered 2015-03-18: 10 mg via INTRAVENOUS
  Filled 2015-03-18: qty 10

## 2015-03-18 MED ORDER — FENTANYL CITRATE (PF) 100 MCG/2ML IJ SOLN
50.0000 ug | Freq: Once | INTRAMUSCULAR | Status: AC
  Administered 2015-03-18: 50 ug via INTRAVENOUS

## 2015-03-18 MED ORDER — CHLORHEXIDINE GLUCONATE 0.12% ORAL RINSE (MEDLINE KIT)
15.0000 mL | Freq: Two times a day (BID) | OROMUCOSAL | Status: DC
  Administered 2015-03-18 – 2015-03-21 (×6): 15 mL via OROMUCOSAL
  Filled 2015-03-18 (×8): qty 15

## 2015-03-18 MED ORDER — MIDAZOLAM HCL 2 MG/2ML IJ SOLN
1.0000 mg | INTRAMUSCULAR | Status: DC | PRN
  Administered 2015-03-18: 17:00:00 via INTRAVENOUS
  Administered 2015-03-18: 1 mg via INTRAVENOUS
  Filled 2015-03-18 (×3): qty 2

## 2015-03-18 MED ORDER — VECURONIUM BROMIDE 10 MG IV SOLR
10.0000 mg | Freq: Once | INTRAVENOUS | Status: AC
  Administered 2015-03-18: 10 mg via INTRAVENOUS

## 2015-03-18 MED ORDER — DEXTROSE 50 % IV SOLN
25.0000 mL | Freq: Once | INTRAVENOUS | Status: AC
  Administered 2015-03-18: 25 mL via INTRAVENOUS
  Filled 2015-03-18: qty 50

## 2015-03-18 MED ORDER — NOREPINEPHRINE BITARTRATE 1 MG/ML IV SOLN
0.0000 ug/min | INTRAVENOUS | Status: DC
  Administered 2015-03-18: 5 ug/min via INTRAVENOUS
  Administered 2015-03-21: 12 ug/min via INTRAVENOUS
  Administered 2015-03-22: 20 ug/min via INTRAVENOUS
  Administered 2015-03-23: 18.027 ug/min via INTRAVENOUS
  Administered 2015-03-23: 18 ug/min via INTRAVENOUS
  Filled 2015-03-18 (×6): qty 16

## 2015-03-18 MED ORDER — MIDAZOLAM HCL 2 MG/2ML IJ SOLN
1.0000 mg | INTRAMUSCULAR | Status: DC | PRN
  Administered 2015-03-18 – 2015-03-20 (×4): 1 mg via INTRAVENOUS
  Filled 2015-03-18: qty 2

## 2015-03-18 MED ORDER — FENTANYL CITRATE (PF) 100 MCG/2ML IJ SOLN
INTRAMUSCULAR | Status: AC
  Administered 2015-03-18: 100 ug via INTRAVENOUS
  Filled 2015-03-18: qty 2

## 2015-03-18 MED ORDER — FENTANYL CITRATE (PF) 100 MCG/2ML IJ SOLN
100.0000 ug | Freq: Once | INTRAMUSCULAR | Status: AC
  Administered 2015-03-18: 100 ug via INTRAVENOUS

## 2015-03-18 MED ORDER — ANTISEPTIC ORAL RINSE SOLUTION (CORINZ)
7.0000 mL | Freq: Four times a day (QID) | OROMUCOSAL | Status: DC
  Administered 2015-03-18 – 2015-03-21 (×13): 7 mL via OROMUCOSAL
  Filled 2015-03-18 (×15): qty 7

## 2015-03-18 MED ORDER — MIDAZOLAM HCL 2 MG/2ML IJ SOLN
1.0000 mg | Freq: Once | INTRAMUSCULAR | Status: AC
  Administered 2015-03-18: 1 mg via INTRAVENOUS

## 2015-03-18 MED ORDER — FENTANYL BOLUS VIA INFUSION
25.0000 ug | INTRAVENOUS | Status: DC | PRN
  Administered 2015-03-18 – 2015-03-22 (×8): 25 ug via INTRAVENOUS
  Filled 2015-03-18: qty 25

## 2015-03-18 MED ORDER — POTASSIUM PHOSPHATES 15 MMOLE/5ML IV SOLN
15.0000 mmol | Freq: Once | INTRAVENOUS | Status: AC
  Administered 2015-03-18: 15 mmol via INTRAVENOUS
  Filled 2015-03-18: qty 5

## 2015-03-18 MED ORDER — FENTANYL 2500MCG IN NS 250ML (10MCG/ML) PREMIX INFUSION
25.0000 ug/h | INTRAVENOUS | Status: DC
  Administered 2015-03-18: 100 ug/h via INTRAVENOUS
  Administered 2015-03-19 – 2015-03-20 (×4): 400 ug/h via INTRAVENOUS
  Filled 2015-03-18 (×7): qty 250

## 2015-03-18 MED ORDER — DOCUSATE SODIUM 50 MG/5ML PO LIQD
100.0000 mg | Freq: Two times a day (BID) | ORAL | Status: DC | PRN
  Filled 2015-03-18: qty 10

## 2015-03-18 MED ORDER — MIDAZOLAM HCL 2 MG/2ML IJ SOLN
INTRAMUSCULAR | Status: AC
  Administered 2015-03-18: 1 mg via INTRAVENOUS
  Filled 2015-03-18: qty 2

## 2015-03-18 NOTE — Progress Notes (Signed)
Patient's blood pressure is 144/77; levophed on hold. She is biting the ETT and PRN versed given.

## 2015-03-18 NOTE — Progress Notes (Signed)
Patient's blood pressure 72/39. Dr. Stevenson Clinch notified and orders received for levophed. Levophed started at 10 mcg.

## 2015-03-18 NOTE — Progress Notes (Signed)
RT to room, patient more lethargic, placed her back on Bipap at previous settings.

## 2015-03-18 NOTE — Progress Notes (Signed)
SLP Cancellation Note  Patient Details Name: Julie Anthony MRN: [id number] DOB: 1940/05/29   Cancelled treatment:       Reason Eval/Treat Not Completed: Medical issues which prohibited therapy Reviewed chart. Per chart review pt's condition is worsening and may need to be re-intubated. No appropriate for tx today. Will f/u when appropriate.    Bevier,Maaran 03/18/2015, 2:45 PM

## 2015-03-18 NOTE — Progress Notes (Signed)
RT to room for intubation by Dr. Stevenson Clinch.  Patient intubated with 7.5ETT on first attempt using glidoscope with 3MAC blade at 23 at lip.  ABG pending, patient tolerated intubation well, will continue to monitor.

## 2015-03-18 NOTE — Care Management (Signed)
Patient's clinical status is declining and plan of care have been revised.  HCPOA no longer wish to wait one week to again address code status. Patient has been reintubated (third time- last two patient self extubated.) .  She is on levophed.  HCPOA informed care team patient informed her in the presence of a nurse she want everything done to prolong life.  At present, are anticipating that patient will have tracheostomy and PEG.  [facility] is following closely.

## 2015-03-18 NOTE — Progress Notes (Signed)
[place] NOTE  Pharmacy Consult for Electrolyte and Phenytoin Monitoring      Allergies  Allergen Reactions  . Penicillins Rash  . Codeine Other (See Comments)    Unknown reaction.  . Ceftin [Cefuroxime Axetil] Hives    Unknown reaction  . Erythromycin Other (See Comments)    Unknown reaction  . Ibuprofen Other (See Comments)    Unknown reaction  . Sulfa Antibiotics Other (See Comments)    Unknown reaction    Patient Measurements: Height: 5\' 5"  (165.1 cm) Weight: 188 lb 11.4 oz (85.6 kg) IBW/kg (Calculated) : 57  Vital Signs: Temp: 97.5 F (36.4 C) (02/03 1222) Temp Source: Axillary (02/03 1222) BP: 103/55 mmHg (02/03 1200) Pulse Rate: 52 (02/03 1200) Intake/Output from previous day: 02/02 0701 - 02/03 0700 In: 2683.3 [P.O.:330; I.V.:2053.3; IV Piggyback:300] Out: 525 [Urine:525] Intake/Output from this shift: Total I/O In: 805 [I.V.:500; IV Piggyback:305] Out: 300 [Urine:300] Vent settings for last 24 hours: Vent Mode:  [-]  FiO2 (%):  [75 %] 75 %  Labs:  Recent Labs  03/16/15 0424 03/17/15 0444 03/18/15 0617  WBC 11.2* 11.2* 9.6  HGB 11.0* 10.6* 11.2*  HCT 34.2* 33.7* 35.6  PLT 117* 109* 109*  CREATININE 0.86 0.78 0.73  MG 2.0  --  1.7  PHOS 2.7  --  2.1*   Estimated Creatinine Clearance: 64.6 mL/min (by C-G formula based on Cr of 0.73).   Recent Labs  03/18/15 0354 03/18/15 0758 03/18/15 1203  GLUCAP 199* 140* 199*    Microbiology: Recent Results (from the past 720 hour(s))  Culture, blood (Routine X 2) w Reflex to ID Panel     Status: None   Collection Time: 03/07/2015  1:00 PM  Result Value Ref Range Status   Specimen Description BLOOD LEFT ANTECUBITAL  Final   Special Requests BOTTLES DRAWN AEROBIC AND ANAEROBIC  1CC  Final   Culture NO GROWTH 5 DAYS  Final   Report Status 03/10/2015 FINAL  Final  Culture, blood (Routine X 2) w Reflex to ID Panel     Status: None   Collection Time: 03/06/2015  1:04 PM  Result  Value Ref Range Status   Specimen Description BLOOD LEFT HAND  Final   Special Requests BOTTLES DRAWN AEROBIC AND ANAEROBIC  1CC  Final   Culture NO GROWTH 5 DAYS  Final   Report Status 03/10/2015 FINAL  Final  MRSA PCR Screening     Status: None   Collection Time: 03/07/15  5:32 PM  Result Value Ref Range Status   MRSA by PCR NEGATIVE NEGATIVE Final    Comment:        The GeneXpert MRSA Assay (FDA approved for NASAL specimens only), is one component of a comprehensive MRSA colonization surveillance program. It is not intended to diagnose MRSA infection nor to guide or monitor treatment for MRSA infections.   MRSA PCR Screening     Status: None   Collection Time: 03/11/15 11:20 AM  Result Value Ref Range Status   MRSA by PCR NEGATIVE NEGATIVE Final    Comment:        The GeneXpert MRSA Assay (FDA approved for NASAL specimens only), is one component of a comprehensive MRSA colonization surveillance program. It is not intended to diagnose MRSA infection nor to guide or monitor treatment for MRSA infections.   Culture, respiratory (NON-Expectorated)     Status: None   Collection Time: 03/14/15  9:28 PM  Result Value Ref Range Status  Specimen Description TRACHEAL ASPIRATE  Final   Special Requests Normal  Final   Gram Stain MODERATE WBC SEEN NO ORGANISMS SEEN   Final   Culture NO GROWTH 3 DAYS  Final   Report Status 03/17/2015 FINAL  Final    Medications:  Scheduled:  . acetylcysteine  3 mL Nebulization BID  . ALPRAZolam  0.25 mg Oral TID  . antiseptic oral rinse  7 mL Mouth Rinse q12n4p  . antiseptic oral rinse  7 mL Mouth Rinse QID  . aspirin EC  81 mg Oral Daily  . budesonide (PULMICORT) nebulizer solution  0.5 mg Nebulization BID  . chlorhexidine gluconate  15 mL Mouth Rinse BID  . cholecalciferol  1,000 Units Oral Daily  . clopidogrel  75 mg Oral Daily  . docusate sodium  100 mg Oral BID  . DULoxetine  60 mg Oral Daily  . enoxaparin (LOVENOX) injection   40 mg Subcutaneous Q24H  . famotidine (PEPCID) IV  20 mg Intravenous Q12H  . ipratropium-albuterol  3 mL Nebulization Q4H  . lactulose  30 g Oral BID  . levothyroxine  175 mcg Oral QHS  . loratadine  10 mg Oral Daily  . metoCLOPramide  5 mg Oral TID AC & HS  . mirtazapine  7.5 mg Oral QHS  . phenytoin (DILANTIN) IV  100 mg Intravenous 3 times per day  . potassium phosphate IVPB (mmol)  15 mmol Intravenous Once  . predniSONE  50 mg Oral Q breakfast   Followed by  . [START ON 03/19/2015] predniSONE  40 mg Oral Q breakfast   Followed by  . [START ON 03/20/2015] predniSONE  30 mg Oral Q breakfast   Followed by  . [START ON 03/21/2015] predniSONE  20 mg Oral Q breakfast   Followed by  . [START ON 03/22/2015] predniSONE  10 mg Oral Q breakfast  . senna  2 tablet Oral QHS  . sodium chloride  3 mL Intravenous Q12H  . sodium chloride flush  10-40 mL Intracatheter Q12H  . traZODone  50 mg Oral QHS   Infusions:  . dexmedetomidine 0.4 mcg/kg/hr (03/17/15 0917)  . dextrose 5 % and 0.45% NaCl 100 mL/hr at 03/18/15 0700    Assessment: Pharmacy consulted to monitor electrolytes and phenytoin for [name].  Plan:  1. Phenytoin: Level 1/31 obtained 5.1, albumin 2.4, corrected phenytoin level 6.4. Phenytoin level is low, however patient has missed several doses of phenytoin due to inability to take PO. Will continue phenytoin 100 mg iv q 8 hours and check another level at steady-state.   2. Phos is low at 2.1. Will replace with potassium phosphate 15 mmol iv once. Will recheck electrolytes with am labs.   Pharmacy will continue to monitor and adjust per consult.    Ulice Dash D 03/18/2015,12:53 PM

## 2015-03-18 NOTE — Progress Notes (Addendum)
Update: Patient clinically worsening today, now with hypotension requiring levo fed, increased confusion and moderately unresponsive at this time, unable to protect her airway and will require urgent if not emergent intubation. Spoke with health care power of attorney, in coordination with Dr. Megan Salon, [facility] stated that patient would want and full measures. She also stated that last night patient was "in her right state of mind" and would want full aggressive medical measures to prolong life, which would include intubation/tracheostomy/PEG tube placement. Review of chart showed that patient had hypercapnia on her blood gas yesterday. Patient is also self extubated twice in the recent past, and stated that intubation and BiPAP were completely uncomfortable and she did not want them anymore. However, healthcare power of attorney stated that in the presence of a nurse, last night patient would want all life prolonging and sustaining measures performed. I explained patient's poor clinical prognosis, limited ability to make rational decisions, however, healthcare power of attorney stated that patient would want full measures as stated above. Dr. Megan Salon and myself addressed all questions from healthcare power of attorney concerning patient's clinical status.    Critical care time = 14mins  Vilinda Boehringer, MD [facility] Pager [phone] (please enter 7-digits) On Call Pager - [phone] (please enter 7-digits)

## 2015-03-18 NOTE — Progress Notes (Signed)
SBP is 66. Levophed resumed at 5 mcg

## 2015-03-18 NOTE — Progress Notes (Signed)
Palliative Medicine Inpatient Consult Follow Up Note   UPDATE:  I spoke with HCPOA, Jannifer Hick. She stated that she and her husband were here last night and they had a talk with her about her wishes.  They say that pt 'was in her right mind' and they described everything to pt such as the trache and PEG etc.  Pt told them she wanted everything to be done to keep her alive.    No MD was present during this discussion last evening so no MD was able to assess pt for capacity.  And --an ABG showed CO2 retention yesterday in an ABG (CO2 65). I am also aware that pt has stated she wants mutually exclusive things (like being off all masks but staying alive).    HCPOA is adamant, however, that she is going to honor pts request to do everything, even though it is most likely that pt had limited ability to appreciate what she was asking for due to her CO2 retention.    I have tried to explain that this next intubation will likely cause harm to pt and that HCPOA has the right to make a more logical choice than pt expressed (especially knowing how her choices thus far have been illogical --asking for mutually exclusive things like 'no mask' and 'I want to live but no mask'.  Dr Stevenson Clinch also spoke in some detail with HCPOA, Jannifer Hick.    Pt to be intubated now. She will end up needing trache and PEG most likely soon.    Colleen Can, MD

## 2015-03-18 NOTE — Procedures (Signed)
Procedure Note:  Orotracheal Intubation  Implied consent due to emergent nature of patient's condition. Correct Patient, Name & ID confirmed.  The patient was pre-oxygenated and then, under direct visualization, a 7.5 mm cuffed endotracheal tube was placed through the vocal cords into the trachea, using the Glidescope.  Total attempts made 1, using Glidescope During intubation an assistant applied gentle pressure to the cricoid cartilage.  Position confirmed by auscultation of lungs (good breath sounds bilaterally) and no stomach sounds.  Tube secured at 23 cm. Pulse ox 96 %. CO2 detector in place with appropriate color change.   Pt tolerated procedure well.  No complications were noted.   CXR ordered.   Vilinda Boehringer, MD [facility] Pager [phone] On Call Pager [phone]

## 2015-03-18 NOTE — Clinical Social Work Note (Signed)
Dr. Megan Salon informed CSW that patient was reintubated and that currently family is wishing to have aggressive care. Patient may need to be evaluated for LTAC. Shela Leff MSW,LCSW [phone]

## 2015-03-18 NOTE — Clinical Documentation Improvement (Signed)
Critical Care  Can the diagnosis of confusion be further specified? Please document response in next progress note NOT in BPA drop down box. Thanks!   Confusion/delirium (including drug induced)  Stupor/Semi-coma  Transient alteration of awareness  Encephalopathy - Alcoholic, Anoxic/Hypoxia, Drug Induced/Toxic (specify drug), Hepatic, Hypertensive, Hypoglycemic, Metabolic/Septic, Traumatic/post concussive, Wernicke, Other   Dementia  Other  Clinically Undetermined  Document any associated diagnoses/conditions.  Supporting Information:  "Unable to provide meaningful information given mental status medical condition" 2/2 progress note   "Unable to assess given mental status/medical condition" same 2/2 progress note   Please exercise your independent, professional judgment when responding. A specific answer is not anticipated or expected.  Thank You,  Zoila Shutter RN, BSN, [place] [phone]; Cell: [phone]

## 2015-03-18 NOTE — Progress Notes (Signed)
Chest pt given with percussor bilaterally. Pt confused tolerated fair.

## 2015-03-18 NOTE — Progress Notes (Signed)
RT to room to place patient on HFNC per verbal order from Dr. Juanell Fairly during morning rounds.  Patient tolerated change well at this time, but RT will monitor closely.

## 2015-03-18 NOTE — Progress Notes (Signed)
Patient intubated by Dr. Stevenson Clinch with 7.5 ETT, 23cm at lip.  Equal bilateral breath sounds, condensation noted in ETT, positive color change on C02 detector. ETT secured to face with commercial holder. Apolonio Schneiders, RN and [place], CP assisted with procedure.

## 2015-03-18 NOTE — Progress Notes (Signed)
Palliative Medicine Inpatient Consult Follow Up Note   Name: Julie Anthony Date: 03/18/2015 MRN: [id number]  DOB: January 06, 1941  Referring Physician: Lytle Butte, MD  Palliative Care consult requested for this 75 y.o. female for goals of medical therapy in patient with acute on chronic respiratory failure. She had been getting Septra DS to treat her pneumonia as an outpt. She was found to have persistent LLL pneumonia with ARF ( CR 2.26) and abnormal LFTs. AST was 1143 with ALT 1249. She also had elevated troponin's c/w a NSTEMI (5.5). She had no chest pain. She had been sent to the ER by Home health and since she was cared for by Home health, was diagnosed with HCAP/ /Aspiraton Pneumonia.  TODAY'S EVENTS/ PLANS:  She is not better. She is worse. She will likely need intubation tonite per critical care MDs.  They asked if I could help to try to reach the [place].  I only have the same one number listed in Demographics that they have.  We have all called and left a message.  It may be that she cannot check her phone until after work sometime after 3 pm.  I have left a text message for Dr. Coralie Common, with [facility] program.  Her CELL NUMBER is [phone]  (I would not give this out or use it unless it is an urgent matter --so considering this to be urgent, I have texted her on that number and await a response).    It would not be in patient's interest to have a continued aggressive care directive.  At the very minimum, a DNR/ DNI would be helpful to have --in her interest.  Her HCPOA [employer]) says she knows pt would not want to live on a vent with a trache and a feeding tube --living in an LTAC type hospital or simiilar institutional setting.  The challenge they have is coming to peace with the pt's MOST form which indicated that pt wanted everything done.  They do realize that pt could not foresee all the negative things that could happen if she were to have all-out aggressive care in this  scenario..    I will check back later and see if care mgmt or [facility] has other contact info for [facility], HCPOA for pt.     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 COPD/ asthma Bilateral Pleural Effusions Elevated LFTs ---GI consult obtained (AST and ALT both >1k ---now near or at nl) ---Deemed to be 'shock liver' likely from ischemia / hypoxia. 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  ----she could prepare some of her own meals and was 'pretty functional' Mild Cardiomyopathy with EF 50% Leukocytosis Anemia --unclear etiology Mild Thrombocytopenia Metabolic Encephalopathy causing confusion  ---due to delerium and illness  REVIEW  OF SYSTEMS:  Patient is not able to provide ROS due to illness and metabolic encephalopathy  CODE STATUS: Full code --for now --seeking a change   PAST MEDICAL HISTORY: Past Medical History  Diagnosis Date  . Cancer [facility])     right breast cancer  . S/P radiation therapy     right breast cancer    PAST SURGICAL HISTORY:  Past Surgical History  Procedure Laterality Date  . Abdominal hysterectomy    . Breast excisional biopsy Right     positive 2006  . Breast biopsy Left      negative 02/2012 stereotactic    Vital Signs: BP 103/55 mmHg  Pulse 52  Temp(Src) 97.5 F (36.4 C) (Axillary)  Resp 24  Ht 5\' 5"  (1.651 m)  Wt 85.6 kg (188 lb 11.4 oz)  BMI 31.40 kg/m2  SpO2 84% Filed Weights   03/16/15 0538 03/17/15 0532 03/18/15 0500  Weight: 80.4 kg (177 lb 4 oz) 84.9 kg (187 lb 2.7 oz) 85.6 kg (188 lb 11.4 oz)    Estimated body mass index is 31.4 kg/(m^2) as calculated from the following:   Height as of this encounter: 5\' 5"  (1.651 m).   Weight as of this encounter: 85.6 kg (188 lb 11.4 oz).  PHYSICAL EXAM: Sedated On BIPAP Mouth open Eyes partly open but not awake No JVD seen Hrt rrr distant HS Lungs cta ant  Abd soft and NT Ext no cyanosis or mottling Skin pale, no mottling or cyanosis  LABS: CBC:    Component Value Date/Time   WBC 9.6 03/18/2015 0617   HGB 11.2* 03/18/2015 0617   HCT 35.6 03/18/2015 0617   PLT 109* 03/18/2015 0617   MCV 99.1 03/18/2015 0617   NEUTROABS 10.4* 02/13/2015 1300   LYMPHSABS 0.8* 03/06/2015 1300   MONOABS 0.9 03/12/2015 1300   EOSABS 0.0 03/04/2015 1300   BASOSABS 0.0 02/21/2015 1300   Comprehensive Metabolic Panel:    Component Value Date/Time   NA 149* 03/18/2015 0617   K 3.8 03/18/2015 0617   CL 110 03/18/2015 0617   CO2 35* 03/18/2015 0617   BUN 31* 03/18/2015 0617   CREATININE 0.73 03/18/2015 0617   GLUCOSE 175* 03/18/2015 0617   CALCIUM 7.9* 03/18/2015 0617   AST 33 03/14/2015 0459   ALT 180* 03/14/2015 0459   ALKPHOS 80 03/14/2015 0459   BILITOT 0.6 03/14/2015 0459   PROT 5.4* 03/14/2015 0459   ALBUMIN 2.4* 03/15/2015 0641     More than 50% of the visit was spent in counseling/coordination of care: YES  Time Spent:  25 min

## 2015-03-18 NOTE — Progress Notes (Addendum)
[place] [facility] [place] NAME: Julie Anthony    MR#:  [id number]  DATE OF BIRTH:  12/24/1940  SUBJECTIVE:  Remains on BiPAP this morning, agitated  REVIEW OF SYSTEMS:  Unable to obtain given patient's mental status /medical condition-baseline dementia with metabolic encephalopathy  DRUG ALLERGIES:   Allergies  Allergen Reactions  . Penicillins Rash  . Codeine Other (See Comments)    Unknown reaction.  . Ceftin [Cefuroxime Axetil] Hives    Unknown reaction  . Erythromycin Other (See Comments)    Unknown reaction  . Ibuprofen Other (See Comments)    Unknown reaction  . Sulfa Antibiotics Other (See Comments)    Unknown reaction    VITALS:  Blood pressure 103/55, pulse 52, temperature 97.5 F (36.4 C), temperature source Axillary, resp. rate 24, height 5\' 5"  (1.651 m), weight 85.6 kg (188 lb 11.4 oz), SpO2 84 %.  PHYSICAL EXAMINATION:   VITAL SIGNS: Filed Vitals:   03/18/15 1200 03/18/15 1222  BP: 103/55   Pulse: 52   Temp:  97.5 F (36.4 C)  Resp: 24    GENERAL:75 y.o.female moderate distress given mental status.  Respiratory status HEAD: Normocephalic, atraumatic.  EYES: Pupils equal, round, reactive to light. Unable to assess extraocular muscles given mental status/medical condition. No scleral icterus.  MOUTH: Dry mucosal membrane. Dentition intact. No abscess noted.  EAR, NOSE, THROAT: Clear without exudates. No external lesions.  NECK: Supple. No thyromegaly. No nodules. No JVD.  PULMONARY: Coarse breath sounds throughout without wheeze rails or rhonci. No use of accessory muscles, poor respiratory effort requiring BiPAP. Poor air entry bilaterally CHEST: Nontender to palpation.  CARDIOVASCULAR: S1 and S2. Regular rate and rhythm. No murmurs, rubs, or gallops. No edema. Pedal pulses 2+ bilaterally.  GASTROINTESTINAL: Soft, nontender, nondistended. No masses. Positive bowel sounds. No hepatosplenomegaly.   MUSCULOSKELETAL: No swelling, clubbing, or edema. Range of motion full in all extremities.  NEUROLOGIC: Unable to assess given mental status/medical condition, spontaneous movement in all extremities SKIN: No ulceration, lesions, rashes, or cyanosis. Skin warm and dry. Turgor intact.  PSYCHIATRIC: Unable to assess given mental status/medical condition       LABORATORY PANEL:   CBC  Recent Labs Lab 03/18/15 0617  WBC 9.6  HGB 11.2*  HCT 35.6  PLT 109*   ------------------------------------------------------------------------------------------------------------------  Chemistries   Recent Labs Lab 03/14/15 0459  03/18/15 0617  NA 148*  < > 149*  K 4.6  < > 3.8  CL 106  < > 110  CO2 37*  < > 35*  GLUCOSE 96  < > 175*  BUN 29*  < > 31*  CREATININE 0.94  < > 0.73  CALCIUM 8.3*  < > 7.9*  MG  --   < > 1.7  AST 33  --   --   ALT 180*  --   --   ALKPHOS 80  --   --   BILITOT 0.6  --   --   < > = values in this interval not displayed. ------------------------------------------------------------------------------------------------------------------  Cardiac Enzymes No results for input(s): TROPONINI in the last 168 hours. ------------------------------------------------------------------------------------------------------------------  RADIOLOGY:  Dg Chest 1 View  03/17/2015  CLINICAL DATA:  75 year old female with dyspnea. Cancer post radiation therapy. Subsequent encounter. EXAM: CHEST 1 VIEW COMPARISON:  03/16/2015 FINDINGS: Cardiomegaly.  Calcified tortuous aorta. Slight improved aeration left perihilar region with persistent asymmetric airspace disease greater left perihilar region and lung bases. Question infiltrate, atelectasis, tumor and/ or posteriorly  layering pleural effusion. No gross pneumothorax. Right PICC line is in place. Tip seen to level of the mid superior vena cava. IMPRESSION: Slight improved aeration left perihilar region with remainder of findings  without change as detailed above. Electronically Signed   By: Genia Del M.D.   On: 03/17/2015 07:35    EKG:   Orders placed or performed during the hospital encounter of 02/27/2015  . ED EKG  . ED EKG    ASSESSMENT AND PLAN:   75 year old Caucasian female with chronic respiratory failure on 2 L of oxygen through nasal cannula at home, who lives alone presents to the hospital with complaints of cough, chest congestion as well as shortness of breath. Admitted 03/12/2015  1. Acute on chronic respiratory failure with hypoxia and hypercapnia due to acute exacerbation of COPD, left lower lobe pneumonia, concerning for aspiration pneumonia, Meropenem completed 03/17/15 Continued need for BiPAP will attempt high flow nasal cannula today  Speech therapy- as per RNs report patient is tolerating thickened liquids at this time without any aspiration. Discussed with speech therapy. Their concern is that patient is regurgitating a lot which would precipitate aspiration    2. Hypernatremia: Improving slightly, continue half-normal saline  3. Acute renal failure, resolved   4. Non-Q-wave MI, type 2, due to demand ischemia, per cardiologist, continue aspirin, heparin given , nitrates, atenolol, no intervention was recommended  5. Elevated transaminases , improved  6. Venous thromboembolism prophylactic: Lovenox  7. Acute on chronic encephalopathy in the setting of baseline dementia worsened by hypercapnia/hypoxia  Disposition: Appreciate provide care input, it seems the family wishes patient to remain full code for 1 week duration, if no improvement at that time further decisions will be made. They do not wish to pursue trach/PEG option at this time  Care management/ palliative care input noted      All the records are reviewed and case discussed with Care Management/Social Workerr. Management plans discussed with the patient, family and they are in agreement.  CODE STATUS: Full  TOTAL  TIME TAKING CARE OF THIS PATIENT: 31 minutes.   POSSIBLE D/C IN 5 DAYS, DEPENDING ON CLINICAL CONDITION.   Naven Giambalvo,  Karenann Cai.D on 03/18/2015 at 12:47 PM  Between 7am to 6pm - Pager - [phone]  After 6pm: House Pager: - [phone]  [facility]  Office  [phone]  CC: Primary care physician; [place]

## 2015-03-18 NOTE — Progress Notes (Signed)
PULMONARY / CRITICAL CARE MEDICINE   Name: Julie Anthony MRN: [id number] DOB: 1941/01/31    ADMISSION DATE:  02/19/2015    ASSESSMENT / PLAN: 75 yo female with PMHx of COPD on 2L, Dementia, CAD, admitted for NSTEMI and AECOPD.  Noted to have LLL opacity, worsening respiratory distress on biPAP  PULMONARY 1.Continued respiratory failure. The patient does not appear to be making any appreciable progress in terms of her respiratory status, she continues to be severely hypoxemic, even with maximum support from BiPAP and on 100% high flow nasal cannula. -Despite her good mental status, she appears to be on the verge of requiring intubation. Overall, I suspect that given her severe respiratory failure, if continued aggressive care is desired- she will likely require intubation, and subsequently tracheostomy with placement of a feeding tube, and transferred to a long-term acute care facility. At this juncture, it does not appear that this patient will  be able to transfer to outpatient care, she will require higher  intensity inpatient treatment for very prolonged period of time, and possibly permanently -If the above interventions are not desired by the power of attorney and PACE, I would strongly recommend that this patient be transitioned to hospice, this would likely need to be take place in the inpatient setting.  -Attempted to reach the patient's power of attorney, by telephone, but was sent to voice mail.  Chest x-ray shows continued bibasilar atelectasis, pneumonia, very reduced lung volumes bilaterally.  2.LLL opacity c/w pneumonia with mucus plugs.  -. Chest x-ray from2022017 and images reviewed, shows continued left-sided pneumonia, with significant improvement from previous films.   3.Multifocal Pneumonia - the patient has completed a 10 day course of meropenem, and has been on antibiotics for a total of 13 days. -. Should the patient decompensate once again and require intubation,  she will likely require tracheostomy if continued aggressive care is desired.  4.NSTEMI - CE if needed - cont with asa, heparin, nitrates, BP control  GASTRO: Continue GI prophylaxis, continue bowel regimen.  INFECTIOUS: --Pneumonia, completing 10 day course of antibiotics today.  Respiratory culture 03/14/2015: Negative. MRSA screen negative. Blood culture 02/16/2015: Negative.  Antibiotics: Meropenem 03/09/2015 >>03/17/15   STUDIES:  1/23 CT Chest - Left upper and left lower lobe pulmonary opacities, suspicious for infection or aspiration. Fluid in the left endobronchial tree could relate to mucous plugging or aspiration. Enlarged PA.  SIGNIFICANT EVENTS: Intubated 03/14/2015. Self extubated 2 on 03/15/2015.   CULTURES: Bld Cx x 2 1/21 >>negative to date MRSA >>negative Sputum Cx 1/22>> negative  sputum culture 1/30>> negative  ANTIBIOTICS Levaquin 1/21>>1/24 Meropenem 1/24>> 2/02  Vanc 1/21>>1/24 Aztreonam 1/21>>1/24  ENDOCRINE:  -Blood glucose controlled -Mild hypernatremia with sodium of 149, improved from yesterday.  SOCIAL: -Palliative care following, patient enrolled in pace and has a healthcare power of attorney. They would like to continue the patient has full code and monitor the patient's progress over the next week. -Patient has self extubated 2, she she appears to have reached a plateau in her condition, she does not appear to be making significant progress.   VITAL SIGNS: Temp:  [97.8 F (36.6 C)-98.5 F (36.9 C)] 97.8 F (36.6 C) (02/03 0758) Pulse Rate:  [54-110] 54 (02/03 0642) Resp:  [14-25] 19 (02/03 0642) BP: (70-166)/(50-97) 124/59 mmHg (02/03 0642) SpO2:  [84 %-100 %] 91 % (02/03 0642) FiO2 (%):  [55 %-90 %] 75 % (02/03 0728) Weight:  [188 lb 11.4 oz (85.6 kg)] 188 lb 11.4 oz (85.6 kg) (  02/03 0500) HEMODYNAMICS:   VENTILATOR SETTINGS: Vent Mode:  [-]  FiO2 (%):  [55 %-90 %] 75 % INTAKE / OUTPUT:  Intake/Output Summary (Last 24  hours) at 03/18/15 [id number] Last data filed at 03/18/15 0600  Gross per 24 hour  Intake   2075 ml  Output    525 ml  Net   1550 ml    Review of Systems  Unable to perform ROS: critical illness  Respiratory: Positive for shortness of breath and wheezing.     Physical Exam  Constitutional: She appears distressed.  HENT:  Head: Normocephalic and atraumatic.  Eyes: Pupils are equal, round, and reactive to light.  Neck: Normal range of motion. Neck supple.  Cardiovascular: Normal rate, regular rhythm and normal heart sounds.   No murmur heard. Pulmonary/Chest: She is in respiratory distress. She has wheezes. She has rales.  Abdominal: Soft. Bowel sounds are normal.  Musculoskeletal: Normal range of motion.  Neurological:  lethargic  Skin: Skin is warm. She is diaphoretic.  Nursing note and vitals reviewed.    LABS:  CBC  Recent Labs Lab 03/16/15 0424 03/17/15 0444 03/18/15 0617  WBC 11.2* 11.2* 9.6  HGB 11.0* 10.6* 11.2*  HCT 34.2* 33.7* 35.6  PLT 117* 109* 109*   Coag's No results for input(s): APTT, INR in the last 168 hours. BMET  Recent Labs Lab 03/16/15 0424 03/17/15 0444 03/18/15 0617  NA 151* 151* 149*  K 4.6 3.8 3.8  CL 109 113* 110  CO2 39* 35* 35*  BUN 36* 33* 31*  CREATININE 0.86 0.78 0.73  GLUCOSE 91 92 175*   Electrolytes  Recent Labs Lab 03/16/15 0424 03/17/15 0444 03/18/15 0617  CALCIUM 8.0* 7.7* 7.9*  MG 2.0  --  1.7  PHOS 2.7  --  2.1*   Sepsis Markers No results for input(s): LATICACIDVEN, PROCALCITON, O2SATVEN in the last 168 hours. ABG  Recent Labs Lab 03/15/15 1320 03/16/15 0431 03/17/15 0346  PHART 7.40 7.37 7.36  PCO2ART 57* 66* 65*  PO2ART 98 74* 86   Liver Enzymes  Recent Labs Lab 03/14/15 0459 03/15/15 0641  AST 33  --   ALT 180*  --   ALKPHOS 80  --   BILITOT 0.6  --   ALBUMIN 2.7* 2.4*   Cardiac Enzymes No results for input(s): TROPONINI, PROBNP in the last 168 hours. Glucose  Recent Labs Lab  03/17/15 0749 03/17/15 1149 03/17/15 1555 03/18/15 0108 03/18/15 0354 03/18/15 0758  GLUCAP 84 151* 189* 145* 199* 140*    Imaging No results found.     Marda Stalker M.D.  Critical Care Attestation.  I have personally obtained a history, examined the patient, evaluated laboratory and imaging results, formulated the assessment and plan and placed orders. The Patient requires high complexity decision making for assessment and support, frequent evaluation and titration of therapies, application of advanced monitoring technologies and extensive interpretation of multiple databases. The patient has critical illness that could lead imminently to failure of 1 or more organ systems and requires the highest level of physician preparedness to intervene.  Critical Care Time devoted to patient care services described in this note is 35 minutes and is exclusive of time spent in procedures.

## 2015-03-19 ENCOUNTER — Inpatient Hospital Stay: Payer: Medicare (Managed Care)

## 2015-03-19 LAB — GLUCOSE, CAPILLARY
GLUCOSE-CAPILLARY: 109 mg/dL — AB (ref 65–99)
GLUCOSE-CAPILLARY: 122 mg/dL — AB (ref 65–99)
GLUCOSE-CAPILLARY: 153 mg/dL — AB (ref 65–99)
GLUCOSE-CAPILLARY: 182 mg/dL — AB (ref 65–99)
GLUCOSE-CAPILLARY: 81 mg/dL (ref 65–99)
GLUCOSE-CAPILLARY: 83 mg/dL (ref 65–99)

## 2015-03-19 LAB — BLOOD GAS, ARTERIAL
ACID-BASE EXCESS: 10.5 mmol/L — AB (ref 0.0–3.0)
Bicarbonate: 32.6 mEq/L — ABNORMAL HIGH (ref 21.0–28.0)
FIO2: 0.5
MECHANICAL RATE: 20
O2 Saturation: 96.9 %
PEEP/CPAP: 5 cmH2O
Patient temperature: 37
VT: 450 mL
pCO2 arterial: 34 mmHg (ref 32.0–48.0)
pH, Arterial: 7.59 — ABNORMAL HIGH (ref 7.350–7.450)
pO2, Arterial: 75 mmHg — ABNORMAL LOW (ref 83.0–108.0)

## 2015-03-19 LAB — MAGNESIUM
MAGNESIUM: 2.4 mg/dL (ref 1.7–2.4)
Magnesium: 1.5 mg/dL — ABNORMAL LOW (ref 1.7–2.4)

## 2015-03-19 LAB — BASIC METABOLIC PANEL
ANION GAP: 7 (ref 5–15)
BUN: 30 mg/dL — ABNORMAL HIGH (ref 6–20)
CALCIUM: 7.9 mg/dL — AB (ref 8.9–10.3)
CO2: 33 mmol/L — ABNORMAL HIGH (ref 22–32)
Chloride: 109 mmol/L (ref 101–111)
Creatinine, Ser: 0.89 mg/dL (ref 0.44–1.00)
Glucose, Bld: 119 mg/dL — ABNORMAL HIGH (ref 65–99)
POTASSIUM: 3.6 mmol/L (ref 3.5–5.1)
Sodium: 149 mmol/L — ABNORMAL HIGH (ref 135–145)

## 2015-03-19 LAB — CBC
HEMATOCRIT: 35.9 % (ref 35.0–47.0)
Hemoglobin: 11.4 g/dL — ABNORMAL LOW (ref 12.0–16.0)
MCH: 30.7 pg (ref 26.0–34.0)
MCHC: 31.8 g/dL — ABNORMAL LOW (ref 32.0–36.0)
MCV: 96.7 fL (ref 80.0–100.0)
PLATELETS: 156 10*3/uL (ref 150–440)
RBC: 3.71 MIL/uL — AB (ref 3.80–5.20)
RDW: 15.4 % — AB (ref 11.5–14.5)
WBC: 14.6 10*3/uL — AB (ref 3.6–11.0)

## 2015-03-19 LAB — ALBUMIN: Albumin: 2.1 g/dL — ABNORMAL LOW (ref 3.5–5.0)

## 2015-03-19 LAB — HEPARIN INDUCED PLATELET AB (HIT ANTIBODY): Heparin Induced Plt Ab: 0.374 OD (ref 0.000–0.400)

## 2015-03-19 LAB — PHOSPHORUS
PHOSPHORUS: 1.2 mg/dL — AB (ref 2.5–4.6)
PHOSPHORUS: 3.3 mg/dL (ref 2.5–4.6)

## 2015-03-19 LAB — PHENYTOIN LEVEL, TOTAL: Phenytoin Lvl: 9.2 ug/mL — ABNORMAL LOW (ref 10.0–20.0)

## 2015-03-19 MED ORDER — DOCUSATE SODIUM 50 MG/5ML PO LIQD
100.0000 mg | Freq: Two times a day (BID) | ORAL | Status: DC
  Administered 2015-03-19 – 2015-03-21 (×5): 100 mg via ORAL
  Filled 2015-03-19 (×5): qty 10

## 2015-03-19 MED ORDER — FREE WATER
200.0000 mL | Freq: Three times a day (TID) | Status: DC
  Administered 2015-03-19 – 2015-03-24 (×16): 200 mL

## 2015-03-19 MED ORDER — MAGNESIUM SULFATE 4 GM/100ML IV SOLN
4.0000 g | Freq: Once | INTRAVENOUS | Status: AC
  Administered 2015-03-19: 4 g via INTRAVENOUS
  Filled 2015-03-19: qty 100

## 2015-03-19 MED ORDER — METOCLOPRAMIDE HCL 5 MG/5ML PO SOLN
5.0000 mg | Freq: Three times a day (TID) | ORAL | Status: DC
  Administered 2015-03-19 – 2015-03-21 (×9): 5 mg via ORAL
  Filled 2015-03-19 (×13): qty 5

## 2015-03-19 MED ORDER — ASPIRIN 81 MG PO CHEW
81.0000 mg | CHEWABLE_TABLET | Freq: Every day | ORAL | Status: DC
  Administered 2015-03-19 – 2015-03-21 (×3): 81 mg via ORAL
  Filled 2015-03-19 (×3): qty 1

## 2015-03-19 MED ORDER — VITAL HIGH PROTEIN PO LIQD
1000.0000 mL | ORAL | Status: DC
  Administered 2015-03-19 – 2015-03-24 (×6): 1000 mL

## 2015-03-19 MED ORDER — POTASSIUM PHOSPHATES 15 MMOLE/5ML IV SOLN
30.0000 mmol | Freq: Once | INTRAVENOUS | Status: AC
  Administered 2015-03-19: 30 mmol via INTRAVENOUS
  Filled 2015-03-19: qty 10

## 2015-03-19 NOTE — Progress Notes (Signed)
Patient's CBG at 0000 was 68, gave 54mL of D50 per hypoglycemia standing orders.  Rechecked CBG 15 minutes later was 83, will continue to monitor.

## 2015-03-19 NOTE — Progress Notes (Addendum)
Julie Anthony NOTE  Pharmacy Consult for Electrolyte and Phenytoin Monitoring      Allergies  Allergen Reactions  . Penicillins Rash  . Codeine Other (See Comments)    Unknown reaction.  . Ceftin [Cefuroxime Axetil] Hives    Unknown reaction  . Erythromycin Other (See Comments)    Unknown reaction  . Ibuprofen Other (See Comments)    Unknown reaction  . Sulfa Antibiotics Other (See Comments)    Unknown reaction    Patient Measurements: Height: 5\' 5"  (165.1 cm) Weight: 187 lb 11.2 oz (85.14 kg) IBW/kg (Calculated) : 57  Vital Signs: Temp: 100.4 F (38 C) (02/04 0600) BP: 139/77 mmHg (02/04 0600) Pulse Rate: 74 (02/04 0600) Intake/Output from previous day: 02/03 0701 - 02/04 0700 In: 3181.5 [I.V.:2826.5; IV Piggyback:355] Out: [id number] [Urine:875] Intake/Output from this shift:   Vent settings for last 24 hours: Vent Mode:  [-] PRVC FiO2 (%):  [50 %-75 %] 50 % Set Rate:  [20 bmp] 20 bmp Vt Set:  [450 mL] 450 mL PEEP:  [5 cmH20] 5 cmH20 Plateau Pressure:  [23 cmH20-28 cmH20] 23 cmH20  Labs:  Recent Labs  03/17/15 0444 03/18/15 0617 03/19/15 0548  WBC 11.2* 9.6 14.6*  HGB 10.6* 11.2* 11.4*  HCT 33.7* 35.6 35.9  PLT 109* 109* 156  CREATININE 0.78 0.73 0.89  MG  --  1.7 1.5*  PHOS  --  2.1* 1.2*   Estimated Creatinine Clearance: 57.9 mL/min (by C-G formula based on Cr of 0.89).   Recent Labs  03/19/15 0016 03/19/15 0338 03/19/15 0736  GLUCAP 83 48 122*    Microbiology: Recent Results (from the past 720 hour(s))  Culture, blood (Routine X 2) w Reflex to ID Panel     Status: None   Collection Time: 03/12/2015  1:00 PM  Result Value Ref Range Status   Specimen Description BLOOD LEFT ANTECUBITAL  Final   Special Requests BOTTLES DRAWN AEROBIC AND ANAEROBIC  1CC  Final   Culture NO GROWTH 5 DAYS  Final   Report Status 03/10/2015 FINAL  Final  Culture, blood (Routine X 2) w Reflex to ID Panel     Status: None   Collection Time: 02/28/2015   1:04 PM  Result Value Ref Range Status   Specimen Description BLOOD LEFT HAND  Final   Special Requests BOTTLES DRAWN AEROBIC AND ANAEROBIC  1CC  Final   Culture NO GROWTH 5 DAYS  Final   Report Status 03/10/2015 FINAL  Final  MRSA PCR Screening     Status: None   Collection Time: 03/07/15  5:32 PM  Result Value Ref Range Status   MRSA by PCR NEGATIVE NEGATIVE Final    Comment:        The GeneXpert MRSA Assay (FDA approved for NASAL specimens only), is one component of a comprehensive MRSA colonization surveillance program. It is not intended to diagnose MRSA infection nor to guide or monitor treatment for MRSA infections.   MRSA PCR Screening     Status: None   Collection Time: 03/11/15 11:20 AM  Result Value Ref Range Status   MRSA by PCR NEGATIVE NEGATIVE Final    Comment:        The GeneXpert MRSA Assay (FDA approved for NASAL specimens only), is one component of a comprehensive MRSA colonization surveillance program. It is not intended to diagnose MRSA infection nor to guide or monitor treatment for MRSA infections.   Culture, respiratory (NON-Expectorated)     Status: None  Collection Time: 03/14/15  9:28 PM  Result Value Ref Range Status   Specimen Description TRACHEAL ASPIRATE  Final   Special Requests Normal  Final   Gram Stain MODERATE WBC SEEN NO ORGANISMS SEEN   Final   Culture NO GROWTH 3 DAYS  Final   Report Status 03/17/2015 FINAL  Final    Medications:  Scheduled:  . acetylcysteine  3 mL Nebulization BID  . ALPRAZolam  0.25 mg Oral TID  . antiseptic oral rinse  7 mL Mouth Rinse q12n4p  . antiseptic oral rinse  7 mL Mouth Rinse QID  . aspirin  81 mg Oral Daily  . budesonide (PULMICORT) nebulizer solution  0.5 mg Nebulization BID  . chlorhexidine gluconate  15 mL Mouth Rinse BID  . cholecalciferol  1,000 Units Oral Daily  . clopidogrel  75 mg Oral Daily  . docusate  100 mg Oral BID  . DULoxetine  60 mg Oral Daily  . enoxaparin (LOVENOX)  injection  40 mg Subcutaneous Q24H  . famotidine (PEPCID) IV  20 mg Intravenous Q12H  . ipratropium-albuterol  3 mL Nebulization Q4H  . lactulose  30 g Oral BID  . levothyroxine  175 mcg Oral QHS  . loratadine  10 mg Oral Daily  . magnesium sulfate 1 - 4 g bolus IVPB  4 g Intravenous Once  . metoCLOPramide  5 mg Oral TID AC & HS  . mirtazapine  7.5 mg Oral QHS  . phenytoin (DILANTIN) IV  100 mg Intravenous 3 times per day  . potassium phosphate IVPB (mmol)  30 mmol Intravenous Once  . predniSONE  40 mg Oral Q breakfast   Followed by  . [START ON 03/20/2015] predniSONE  30 mg Oral Q breakfast   Followed by  . [START ON 03/21/2015] predniSONE  20 mg Oral Q breakfast   Followed by  . [START ON 03/22/2015] predniSONE  10 mg Oral Q breakfast  . senna  2 tablet Oral QHS  . sodium chloride  3 mL Intravenous Q12H  . sodium chloride flush  10-40 mL Intracatheter Q12H  . traZODone  50 mg Oral QHS   Infusions:  . dextrose 5 % and 0.45% NaCl 100 mL/hr at 03/19/15 0753  . fentaNYL infusion INTRAVENOUS 400 mcg/hr (03/19/15 0032)  . norepinephrine (LEVOPHED) Adult infusion 8 mcg/min (03/19/15 [id number])    Assessment: Pharmacy consulted to monitor electrolytes and phenytoin for [name].  Plan:  1. Phenytoin: Level 1/31 obtained 9.2, albumin 2.1, corrected phenytoin level 17.9. Phenytoin level is within goal range; therefore will continue current regimen of Phenytoin 100 mg IV q8 hours.   2. Phos is low at 1.2 and Magnesium is 1.5. Will replace with potassium phosphate 30 mmol iv once and Magnesium 4 g IV x 1. Will recheck Phosphorus and Magnesium @ 1800  Pharmacy will continue to monitor and adjust per consult.    Julie Anthony D 03/19/2015,8:28 AM

## 2015-03-19 NOTE — Progress Notes (Signed)
[facility] NOTE  Pharmacy Consult for electrolyte management Indication: hypomagnesemia, hypophosphatemia, hypokalemia   Vital Signs: Temp: 99.5 F (37.5 C) (02/04 1700) Temp Source: Rectal (02/04 1600) BP: 112/63 mmHg (02/04 1700) Pulse Rate: 77 (02/04 1700) Intake/Output from previous day: 02/03 0701 - 02/04 0700 In: 3329.4 [I.V.:2974.4; IV Piggyback:355] Out: [id number] [Urine:875] Intake/Output from this shift: Total I/O In: 2547.5 [I.V.:1622.5; NG/GT:365; IV Piggyback:560] Out: -   Assessment: Pharmacy monitoring and replacing electrolytes as needed.   Labs at 1700 today (after being replaced this morning): Mg of 2.4 WNL Phos of 3.3 WNL  Goal of Therapy:  Mg and Phos are within normal limits  Plan:  Electrolytes this evening are within normal limits, no replacement needed at this time.   Will check K/Mg/Phos with AM labs tomorrow.  Lenis Noon, PharmD Clinical Pharmacist 03/19/2015,6:34 PM

## 2015-03-19 NOTE — Progress Notes (Signed)
Received call from [facility] stated that ETT tube needed to be pulled back. Notified Iona Beard RT and will continue to assess and monitor.

## 2015-03-19 NOTE — Progress Notes (Signed)
[place] [facility] [place] NAME: Julie Anthony    MR#:  [id number]  DATE OF BIRTH:  07-28-1940  SUBJECTIVE:  Patient is 75 year old Caucasian female who presents to the hospital with complaints of cough, congestion, shortness of breath . On arrival to emergency room, she was found to have persistent left lower lobe pneumonia and acute renal failure and abnormal LFTs, elevated troponin of 5.5, consistent with non-STEMI . Initially managed on BiPAP , deteriorated. On February 3 and intubated due to hypotension requiring Levophed, metabolic encephalopathy due to hypercapnia. Remains intubated and sedated. Patient's family is considering terminal extubation next Thursday, one week after medical therapy initiation, discussed with power of attorney for medical care  REVIEW OF SYSTEMS:  Unable to obtain given patient's mental status /medical condition-baseline dementia with metabolic encephalopathy  DRUG ALLERGIES:   Allergies  Allergen Reactions  . Penicillins Rash  . Codeine Other (See Comments)    Unknown reaction.  . Ceftin [Cefuroxime Axetil] Hives    Unknown reaction  . Erythromycin Other (See Comments)    Unknown reaction  . Ibuprofen Other (See Comments)    Unknown reaction  . Sulfa Antibiotics Other (See Comments)    Unknown reaction    VITALS:  Blood pressure 112/63, pulse 73, temperature 100 F (37.8 C), temperature source Rectal, resp. rate 15, height 5\' 5"  (1.651 m), weight 84.823 kg (187 lb), SpO2 95 %.  PHYSICAL EXAMINATION:   VITAL SIGNS: Filed Vitals:   03/19/15 1200 03/19/15 1300  BP: 114/63 112/63  Pulse: 81 73  Temp: 100 F (37.8 C) 100 F (37.8 C)  Resp: 28 15   GENERAL:75 y.o.female intubated and sedated,  now on mechanical ventilation Respiratory status HEAD: Normocephalic, atraumatic.  EYES: Pupils equal, round, reactive to light. Unable to assess extraocular muscles given mental status/medical condition.  No scleral icterus.  MOUTH: Dry mucosal membrane. Dentition intact. No abscess noted.  EAR, NOSE, THROAT: Clear without exudates. No external lesions.  NECK: Supple. No thyromegaly. No nodules. No JVD.  PULMONARY: Coarse breath sounds throughout without wheeze rails or rhonci. No use of accessory muscles, poor respiratory effort requiring BiPAP. Poor air entry bilaterally   CHEST: Nontender to palpation.  CARDIOVASCULAR: S1 and S2. Regular rate and rhythm. No murmurs, rubs, or gallops. No edema. Pedal pulses 2+ bilaterally.  GASTROINTESTINAL: Soft, nontender, nondistended. No masses. Positive bowel sounds. No hepatosplenomegaly.  MUSCULOSKELETAL: No swelling, clubbing, or edema. Range of motion full in all extremities.  NEUROLOGIC: Unable to assess given mental status/medical condition, spontaneous movement in all extremities SKIN: No ulceration, lesions, rashes, or cyanosis. Skin warm and dry. Turgor intact.  PSYCHIATRIC: Unable to assess given mental status/medical condition       LABORATORY PANEL:   CBC  Recent Labs Lab 03/19/15 0548  WBC 14.6*  HGB 11.4*  HCT 35.9  PLT 156   ------------------------------------------------------------------------------------------------------------------  Chemistries   Recent Labs Lab 03/14/15 0459  03/19/15 0548  NA 148*  < > 149*  K 4.6  < > 3.6  CL 106  < > 109  CO2 37*  < > 33*  GLUCOSE 96  < > 119*  BUN 29*  < > 30*  CREATININE 0.94  < > 0.89  CALCIUM 8.3*  < > 7.9*  MG  --   < > 1.5*  AST 33  --   --   ALT 180*  --   --   ALKPHOS 80  --   --  BILITOT 0.6  --   --   < > = values in this interval not displayed. ------------------------------------------------------------------------------------------------------------------  Cardiac Enzymes No results for input(s): TROPONINI in the last 168  hours. ------------------------------------------------------------------------------------------------------------------  RADIOLOGY:  Dg Chest 1 View  03/19/2015  CLINICAL DATA:  Dyspnea, RIGHT breast cancer EXAM: CHEST 1 VIEW COMPARISON:  Radiograph 03/18/2015 FINDINGS: Endotracheal tube at the carina. RIGHT central venous line and NG tube unchanged. There is a decrease in lung volumes and increase in basilar atelectasis. There is increase in perihilar airspace disease. IMPRESSION: 1. Endotracheal tube at carina.  Consider retraction by several cm. 2. Increased in a pulmonary edema pattern and pleural effusions. 3. Decrease in lung volumes. These results will be called to the ordering clinician or representative by the Radiologist Assistant, and communication documented in the PACS or zVision Dashboard. Electronically Signed   By: Suzy Bouchard M.D.   On: 03/19/2015 09:25   Dg Abd 1 View  03/18/2015  CLINICAL DATA:  Nasogastric catheter placement EXAM: ABDOMEN - 1 VIEW COMPARISON:  03/15/2015 FINDINGS: A nasogastric catheter is noted within the stomach. Endotracheal tube is seen approximately 2 cm above the carina. Scattered large and small bowel gas is noted. No free air is seen. Bilateral effusions are noted with basilar changes. A right PICC line is noted. IMPRESSION: Nasogastric catheter within the stomach. Electronically Signed   By: Inez Catalina M.D.   On: 03/18/2015 16:04   Dg Chest Port 1 View  03/18/2015  CLINICAL DATA:  Status post intubation.  Respiratory failure. EXAM: PORTABLE CHEST 1 VIEW COMPARISON:  03/17/2015 FINDINGS: The ET tube tip is above the carina. There is a nasogastric tube with tip in the stomach. The side port is well below the GE junction. PICC line tip is in the projection of the SVC. Heart size appears normal. There are bilateral pleural effusions noted left greater night. Airspace opacification within the left midlung and left lower lobe is similar to previous exam.  IMPRESSION: 1. No change in bilateral pleural effusions and diminished aeration to the left lung. Electronically Signed   By: Kerby Moors M.D.   On: 03/18/2015 16:05    EKG:   Orders placed or performed during the hospital encounter of 03/08/2015  . ED EKG  . ED EKG    ASSESSMENT AND PLAN:   75 year old Caucasian female with chronic respiratory failure on 2 L of oxygen through nasal cannula at home, who lives alone presents to the hospital with complaints of cough, chest congestion as well as shortness of breath. Admitted 02/21/2015  1. Acute on chronic respiratory failure with hypoxia and hypercapnia due to acute exacerbation of COPD, left lower lobe pneumonia, concerning for aspiration pneumonia, Meropenem completed 03/17/15 Continued need for BiPAP Initially , now intubated , continue current therapy , pulmonologist was consulted and the care is appreciated . An oncologist feels that patient's condition is just not improving and she will likely need to have tracheostomy done and placement of feeding tube and transferred to long-term acute care facility. Final discussions will be held with healthcare power of attorney, paced, M.D. and palliative care on Monday, which is to be scheduled. continue weaning as tolerated   2. HypernatremiaNo significant improvement, discontinue half-normal saline , as patient will be initiated on tube feeds, follow in the morning with advanced water intake  3. Acute renal failure, resolved   4. Non-Q-wave MI, type 2, due to demand ischemia, per cardiologist, continue aspirin, heparin , nitrates, atenolol, no intervention was recommended  5. Elevated transaminases , improved  6. Venous thromboembolism prophylactic: Lovenox  7. Acute on chronic encephalopathy in the setting of baseline dementia worsened by hypercapnia/hypoxia  Disposition, Appreciate palliative   care input, it seems the family wishes patient to remain full code for 1 week duration, if no  improvement at that time further decisions will be made. They do not wish to pursue trach/PEG option at this time, discussed with power of attorney for medical care today   Care management/ palliative care input noted      All the records are reviewed and case discussed with Care Management/Social Workerr. Management plans discussed with the patient, family and they are in agreement.  CODE STATUS: Full  TOTAL TIME TAKING CARE OF THIS PATIENT:  56  Minutes   discussed with power of attorney for medical care for 10-15 minutes , all questions were answered   POSSIBLE D/C IN 5 DAYS, DEPENDING ON CLINICAL CONDITION.   Theodoro Grist M.D on 03/19/2015 at 3:13 PM  Between 7am to 6pm - Pager - [phone]  After 6pm: House Pager: - [place]  Office  [phone]  CC: Primary care physician; [place]

## 2015-03-19 NOTE — Progress Notes (Signed)
Palliative Care Update  I was reviewing notes offsite and see that a meeting is planned for Monday to include Palliative Care MD as well as PACE MD and others. I have a scheduled day off on Monday, so I cannot be there.  Originally, the [facility] (who is a friend --there is no competent involved family), has said they would consider changing her to comfort care after [place] after our first meeting on Jan 31st.  That was before they had their own meeting with the pt who told them she wanted 'everything done' to keep her alive.  No MD was present at that time to help determine pt's capacity for appreciating what she was asking for, etc.  I am not sure if they would still be amenable to considering stopping futile care one week after that first meeting date --or not.  BUT --that would be Feb 7th --Tuesday of next week.  I will be back then and perhaps we can have a meeting on that date ---Later in the day as the [place] works until 3 or 4 every day.     Colleen Can, MD

## 2015-03-19 NOTE — Progress Notes (Signed)
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  Noted to have LLL opacity, worsening respiratory distress on biPAP reintubated on 2/3  PULMONARY 1.Continued respiratory failure. The patient does not appear to be making any appreciable progress in terms of her respiratory status, and was subsequently reintubated (3rd time) on 2/3 -she will likely require intubation, and subsequently tracheostomy with placement of a feeding tube, and transferred to a long-term acute care facility. At this juncture, it does not appear that this patient will  be able to transfer to outpatient care, she will require higher  intensity inpatient treatment for very prolonged period of time, and possibly permanently -the above interventions are desired by the power of attorney, and final discussions to be held with Chauncey Reading, PACE MD and palliative care on Monday (this is need to scheduled).  -continue mechanical ventilation, wean as tolerated -Maintain O2 saturations greater than 88% -Wake up assessment/spontaneous breathing trial  2.LLL opacity c/w pneumonia with mucus plugs.  -. Chest x-ray from 2/3 and images reviewed, shows continued left-sided pneumonia, with significant improvement from previous films.   3.Multifocal Pneumonia - the patient has completed a 10 day course of meropenem, and has been on antibiotics for a total of 13 days.  4.NSTEMI - CE if needed - cont with asa, heparin, nitrates, BP control  GASTRO: Continue GI prophylaxis, continue bowel regimen.  INFECTIOUS: --Pneumonia, completed 10 day course of antibiotics today.  Respiratory culture 03/14/2015: Negative. MRSA screen negative. Blood culture 02/27/2015: Negative.  Antibiotics: Meropenem 03/09/2015 >>03/17/15   STUDIES:  1/23 CT Chest - Left upper  and left lower lobe pulmonary opacities, suspicious for infection or aspiration. Fluid in the left endobronchial tree could relate to mucous plugging or aspiration. Enlarged PA.  SIGNIFICANT EVENTS: Intubated 03/14/2015. Self extubated 2 on 03/15/2015. Reintubated on 2/3>>   CULTURES: Bld Cx x 2 1/21 >>negative to date MRSA >>negative Sputum Cx 1/22>> negative  sputum culture 1/30>> negative  ANTIBIOTICS Levaquin 1/21>>1/24 Meropenem 1/24>> 2/02  Vanc 1/21>>1/24 Aztreonam 1/21>>1/24  ENDOCRINE:  -Blood glucose controlled -Mild hypernatremia with sodium of 149, improved from yesterday.  SOCIAL: -Palliative care following, patient enrolled in pace and has a healthcare power of attorney. At this time, health care power of attorney has decided to proceed with full medical care including the possibility of tracheostomy and PEG tube placement. This will need to be further discussed and finalized on Monday with palliative care, health care power of attorney and pace physician   VITAL SIGNS: Temp:  [96.6 F (35.9 C)-100.6 F (38.1 C)] 100.2 F (37.9 C) (02/04 0900) Pulse Rate:  [48-88] 84 (02/04 0900) Resp:  [5-25] 20 (02/04 0900) BP: (63-217)/(35-104) 114/67 mmHg (02/04 0900) SpO2:  [89 %-100 %] 97 % (02/04 0900) FiO2 (%):  [50 %-75 %] 50 % (02/04 1117) Weight:  [187 lb (84.823 kg)-187 lb 11.2 oz (85.14 kg)] 187 lb (84.823 kg) (02/04 1041) HEMODYNAMICS:   VENTILATOR SETTINGS: Vent Mode:  [-] PRVC FiO2 (%):  [50 %-75 %] 50 % Set Rate:  [15 bmp-20 bmp] 15 bmp Vt Set:  [450 mL] 450 mL PEEP:  [5 cmH20] 5 cmH20 Plateau Pressure:  [23 cmH20-28 cmH20] 23 cmH20 INTAKE / OUTPUT:  Intake/Output Summary (Last 24 hours) at 03/19/15 1234 Last data filed at 03/19/15 0800  Gross per  24 hour  Intake 2466.54 ml  Output    575 ml  Net 1891.54 ml    Review of Systems  Unable to perform ROS: intubated    Physical Exam  HENT:  Head: Normocephalic and atraumatic.  Eyes: Right eye  exhibits no discharge. Left eye exhibits no discharge.  Neck: Normal range of motion. Neck supple.  Cardiovascular: Normal rate, regular rhythm and normal heart sounds.   No murmur heard. Pulmonary/Chest: She is in respiratory distress. She has wheezes. She has rales.  Abdominal: Soft. Bowel sounds are normal.  Musculoskeletal: Normal range of motion.  Neurological:  lethargic  Skin: Skin is warm.  Nursing note and vitals reviewed.    LABS:  CBC  Recent Labs Lab 03/17/15 0444 03/18/15 0617 03/19/15 0548  WBC 11.2* 9.6 14.6*  HGB 10.6* 11.2* 11.4*  HCT 33.7* 35.6 35.9  PLT 109* 109* 156   Coag's No results for input(s): APTT, INR in the last 168 hours. BMET  Recent Labs Lab 03/17/15 0444 03/18/15 0617 03/19/15 0548  NA 151* 149* 149*  K 3.8 3.8 3.6  CL 113* 110 109  CO2 35* 35* 33*  BUN 33* 31* 30*  CREATININE 0.78 0.73 0.89  GLUCOSE 92 175* 119*   Electrolytes  Recent Labs Lab 03/16/15 0424 03/17/15 0444 03/18/15 0617 03/19/15 0548  CALCIUM 8.0* 7.7* 7.9* 7.9*  MG 2.0  --  1.7 1.5*  PHOS 2.7  --  2.1* 1.2*   Sepsis Markers No results for input(s): LATICACIDVEN, PROCALCITON, O2SATVEN in the last 168 hours. ABG  Recent Labs Lab 03/17/15 0346 03/18/15 1706 03/19/15 0600  PHART 7.36 7.40 7.59*  PCO2ART 65* 54* 34  PO2ART 86 58* 75*   Liver Enzymes  Recent Labs Lab 03/14/15 0459 03/15/15 0641  AST 33  --   ALT 180*  --   ALKPHOS 80  --   BILITOT 0.6  --   ALBUMIN 2.7* 2.4*   Cardiac Enzymes No results for input(s): TROPONINI, PROBNP in the last 168 hours. Glucose  Recent Labs Lab 03/18/15 1932 03/18/15 2325 03/19/15 0016 03/19/15 0338 03/19/15 0736 03/19/15 1126  GLUCAP 152* 68 83 81 122* 153*    Imaging Dg Chest 1 View  03/19/2015  CLINICAL DATA:  Dyspnea, RIGHT breast cancer EXAM: CHEST 1 VIEW COMPARISON:  Radiograph 03/18/2015 FINDINGS: Endotracheal tube at the carina. RIGHT central venous line and NG tube unchanged.  There is a decrease in lung volumes and increase in basilar atelectasis. There is increase in perihilar airspace disease. IMPRESSION: 1. Endotracheal tube at carina.  Consider retraction by several cm. 2. Increased in a pulmonary edema pattern and pleural effusions. 3. Decrease in lung volumes. These results will be called to the ordering clinician or representative by the Radiologist Assistant, and communication documented in the PACS or zVision Dashboard. Electronically Signed   By: Suzy Bouchard M.D.   On: 03/19/2015 09:25   Dg Abd 1 View  03/18/2015  CLINICAL DATA:  Nasogastric catheter placement EXAM: ABDOMEN - 1 VIEW COMPARISON:  03/15/2015 FINDINGS: A nasogastric catheter is noted within the stomach. Endotracheal tube is seen approximately 2 cm above the carina. Scattered large and small bowel gas is noted. No free air is seen. Bilateral effusions are noted with basilar changes. A right PICC line is noted. IMPRESSION: Nasogastric catheter within the stomach. Electronically Signed   By: Inez Catalina M.D.   On: 03/18/2015 16:04   Dg Chest Port 1 View  03/18/2015  CLINICAL DATA:  Status post  intubation.  Respiratory failure. EXAM: PORTABLE CHEST 1 VIEW COMPARISON:  03/17/2015 FINDINGS: The ET tube tip is above the carina. There is a nasogastric tube with tip in the stomach. The side port is well below the GE junction. PICC line tip is in the projection of the SVC. Heart size appears normal. There are bilateral pleural effusions noted left greater night. Airspace opacification within the left midlung and left lower lobe is similar to previous exam. IMPRESSION: 1. No change in bilateral pleural effusions and diminished aeration to the left lung. Electronically Signed   By: Kerby Moors M.D.   On: 03/18/2015 16:05       I have personally obtained a history, examined the patient, evaluated laboratory and imaging results, formulated the assessment and plan and placed orders. CRITICAL CARE: The  patient is critically ill with multiple organ systems failure and requires high complexity decision making for assessment and support, frequent evaluation and titration of therapies, application of advanced monitoring technologies and extensive interpretation of multiple databases. Critical Care Time devoted to patient care services described in this note is 40 minutes.    Vilinda Boehringer, MD [facility] Pager [phone] (please enter 7-digits) On Call Pager - [phone] (please enter 7-digits)

## 2015-03-19 NOTE — Progress Notes (Signed)
ET Tube pulled back 2 cm from 24 to 22 per order Dr.Mungal

## 2015-03-19 NOTE — Progress Notes (Signed)
Nutrition Follow-up     INTERVENTION:  GY:IRSWN with Dr. Stevenson Clinch and agreeable to start enteral nutrition. Recommend vital high protein at goal rate of 62m/hr.  Provides 1200 kcals, 105 g of protein (92% of needs met) and 10051mfree water.     NUTRITION DIAGNOSIS:   Inadequate oral intake related to inability to eat as evidenced by NPO status.    GOAL:   Patient will meet greater than or equal to 90% of their needs    MONITOR:    (Energy Intake, Anthropometrics, Pulmonary Profile, Digestive System)  REASON FOR ASSESSMENT:   LOS    ASSESSMENT:     Pt intubated again this admission. Palliative care following, noted HCPOA wants everything done at this time (ie trach and PEG if needed)   Current Nutrition: NPO   Gastrointestinal Profile: Last BM: 2/2   Scheduled Medications:  . acetylcysteine  3 mL Nebulization BID  . ALPRAZolam  0.25 mg Oral TID  . antiseptic oral rinse  7 mL Mouth Rinse q12n4p  . antiseptic oral rinse  7 mL Mouth Rinse QID  . aspirin  81 mg Oral Daily  . budesonide (PULMICORT) nebulizer solution  0.5 mg Nebulization BID  . chlorhexidine gluconate  15 mL Mouth Rinse BID  . cholecalciferol  1,000 Units Oral Daily  . clopidogrel  75 mg Oral Daily  . docusate  100 mg Oral BID  . enoxaparin (LOVENOX) injection  40 mg Subcutaneous Q24H  . famotidine (PEPCID) IV  20 mg Intravenous Q12H  . ipratropium-albuterol  3 mL Nebulization Q4H  . lactulose  30 g Oral BID  . levothyroxine  175 mcg Oral QHS  . loratadine  10 mg Oral Daily  . metoCLOPramide  5 mg Oral TID AC & HS  . mirtazapine  7.5 mg Oral QHS  . phenytoin (DILANTIN) IV  100 mg Intravenous 3 times per day  . potassium phosphate IVPB (mmol)  30 mmol Intravenous Once  . [START ON 03/20/2015] predniSONE  30 mg Oral Q breakfast   Followed by  . [START ON 03/21/2015] predniSONE  20 mg Oral Q breakfast   Followed by  . [START ON 03/22/2015] predniSONE  10 mg Oral Q breakfast  . senna  2 tablet  Oral QHS  . sodium chloride  3 mL Intravenous Q12H  . sodium chloride flush  10-40 mL Intracatheter Q12H  . traZODone  50 mg Oral QHS    Continuous Medications:  . dextrose 5 % and 0.45% NaCl 100 mL/hr at 03/19/15 0753  . fentaNYL infusion INTRAVENOUS 400 mcg/hr (03/19/15 0032)  . norepinephrine (LEVOPHED) Adult infusion 8 mcg/min (03/19/15 0612)     Electrolyte/Renal Profile and Glucose Profile:   Recent Labs Lab 03/16/15 0424 03/17/15 0444 03/18/15 0617 03/19/15 0548  NA 151* 151* 149* 149*  K 4.6 3.8 3.8 3.6  CL 109 113* 110 109  CO2 39* 35* 35* 33*  BUN 36* 33* 31* 30*  CREATININE 0.86 0.78 0.73 0.89  CALCIUM 8.0* 7.7* 7.9* 7.9*  MG 2.0  --  1.7 1.5*  PHOS 2.7  --  2.1* 1.2*  GLUCOSE 91 92 175* 119*   Protein Profile:  Recent Labs Lab 03/14/15 0459 03/15/15 0641  ALBUMIN 2.7* 2.4*      Weight Trend since Admission: Filed Weights   03/18/15 0500 03/19/15 0500 03/19/15 1041  Weight: 188 lb 11.4 oz (85.6 kg) 187 lb 11.2 oz (85.14 kg) 187 lb (84.823 kg)      Diet Order:  Diet  NPO time specified  Skin:   reviewed   Height:   Ht Readings from Last 1 Encounters:  03/19/15 5' 5"  (1.651 m)    Weight:   Wt Readings from Last 1 Encounters:  03/19/15 187 lb (84.823 kg)    Ideal Body Weight:     BMI:  Body mass index is 31.12 kg/(m^2).  Estimated Nutritional Needs:   Kcal:  (11-14 kcals/kg) Using wt of 85kg) [phone] kcals/d.   Protein:  (2.0-2.5 g/kg) Using IBW of 57kg 114-143 g/d  Fluid:  (25-57m/kg) [id number]  EDUCATION NEEDS:   Education needs no appropriate at this time  HICapronAlZenia ResidesRDLaMoureLDLakewoodpager) Weekend/On-Call pager ([phone]

## 2015-03-20 LAB — MAGNESIUM: MAGNESIUM: 2.2 mg/dL (ref 1.7–2.4)

## 2015-03-20 LAB — BASIC METABOLIC PANEL
Anion gap: 3 — ABNORMAL LOW (ref 5–15)
BUN: 31 mg/dL — ABNORMAL HIGH (ref 6–20)
CHLORIDE: 109 mmol/L (ref 101–111)
CO2: 33 mmol/L — ABNORMAL HIGH (ref 22–32)
Calcium: 7.3 mg/dL — ABNORMAL LOW (ref 8.9–10.3)
Creatinine, Ser: 0.99 mg/dL (ref 0.44–1.00)
GFR calc non Af Amer: 54 mL/min — ABNORMAL LOW (ref >60)
Glucose, Bld: 143 mg/dL — ABNORMAL HIGH (ref 65–99)
POTASSIUM: 3.9 mmol/L (ref 3.5–5.1)
SODIUM: 145 mmol/L (ref 135–145)

## 2015-03-20 LAB — GLUCOSE, CAPILLARY
GLUCOSE-CAPILLARY: 152 mg/dL — AB (ref 65–99)
GLUCOSE-CAPILLARY: 165 mg/dL — AB (ref 65–99)
Glucose-Capillary: 115 mg/dL — ABNORMAL HIGH (ref 65–99)
Glucose-Capillary: 172 mg/dL — ABNORMAL HIGH (ref 65–99)
Glucose-Capillary: 94 mg/dL (ref 65–99)

## 2015-03-20 LAB — PHOSPHORUS: PHOSPHORUS: 3 mg/dL (ref 2.5–4.6)

## 2015-03-20 MED ORDER — SODIUM CHLORIDE 0.9 % IV SOLN
0.0000 ug/h | INTRAVENOUS | Status: DC
  Administered 2015-03-20 (×2): 400 ug/h via INTRAVENOUS
  Administered 2015-03-21: 150 ug/h via INTRAVENOUS
  Administered 2015-03-21: 400 ug/h via INTRAVENOUS
  Administered 2015-03-22 (×2): 25 ug/h via INTRAVENOUS
  Administered 2015-03-22: 100 ug/h via INTRAVENOUS
  Administered 2015-03-23 – 2015-03-24 (×2): 150 ug/h via INTRAVENOUS
  Filled 2015-03-20 (×11): qty 50

## 2015-03-20 NOTE — Progress Notes (Signed)
[facility] NOTE  Pharmacy Consult for electrolyte management Indication: hypomagnesemia, hypophosphatemia, hypokalemia   Vital Signs: Temp: 99.1 F (37.3 C) (02/04 1900) Temp Source: Rectal (02/04 2000) BP: 106/62 mmHg (02/04 1900) Pulse Rate: 69 (02/04 1900) Intake/Output from previous day: 02/04 0701 - 02/05 0700 In: 4931.3 [I.V.:3079.5; NG/GT:1241.8; IV Piggyback:610] Out: 600 [Urine:600] Intake/Output from this shift: Total I/O In: 1966.3 [I.V.:1309.5; NG/GT:606.8; IV Piggyback:50] Out: 100 [Urine:100]  Assessment: Pharmacy monitoring and replacing electrolytes as needed.   Labs at 1700 today (after being replaced this morning): Mg of 2.4 WNL Phos of 3.3 WNL  Goal of Therapy:  Mg and Phos are within normal limits  Plan:  Electrolytes WNL. Will recheck with AM labs.  Laural Benes, Pharm.D., BCPS Clinical Pharmacist 03/20/2015,5:23 AM

## 2015-03-20 NOTE — Progress Notes (Signed)
Pt's sisterHolley Dexter) arrived to see patient.  Sister noticeably upset.  She was unaware pt had given healthcare power of attorney to a "stranger."  I was able to call POA who agreed to talk with sister.  Permission was received from [place] to give update to [employer].  Dr. Stevenson Clinch spoke with Mrs baker and gave her an update.  Sister is asking to be involved in further decision making.  Telephone numbers added to chart, and Dr. Stevenson Clinch will inform social worker and  hospice doctor for update.

## 2015-03-20 NOTE — Progress Notes (Signed)
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  Noted to have LLL opacity, worsening respiratory distress on biPAP reintubated on 2/3  PULMONARY 1.Continued respiratory failure. The patient does not appear to be making any appreciable progress in terms of her respiratory status, and was subsequently reintubated (3rd time) on 2/3 -she will likely require intubation, and subsequently tracheostomy with placement of a feeding tube, and transferred to a long-term acute care facility. At this juncture, it does not appear that this patient will  be able to transfer to outpatient care, she will require higher  intensity inpatient treatment for very prolonged period of time, and possibly permanently -the above interventions are desired by the power of attorney, and final discussions to be held with Chauncey Reading, PACE MD and palliative care on Monday (this is need to scheduled).  -continue mechanical ventilation, wean as tolerated -Maintain O2 saturations greater than 88% -Wake up assessment/spontaneous breathing trial  2.LLL opacity c/w pneumonia with mucus plugs.  -. Chest x-ray from 2/3 and images reviewed, shows continued left-sided pneumonia, with significant improvement from previous films.   3.Multifocal Pneumonia - the patient has completed a 10 day course of meropenem, and has been on antibiotics for a total of 13 days. -question aspiration with advancing dementia.   4.NSTEMI - CE if needed - cont with asa, heparin, nitrates, BP control  GASTRO: Continue GI prophylaxis, continue bowel regimen.  INFECTIOUS: --Pneumonia, completed 10 day course of antibiotics today.  Respiratory culture 03/14/2015: Negative. MRSA screen negative. Blood culture 02/22/2015: Negative.  Antibiotics: Meropenem 03/09/2015  >>03/17/15  SOCIAL- -Today patient's eldest sister visited the patient in the ICU, she was very concerned about the decisions being made by the healthcare power of attorney, she stated that the family and the patient would not want these advanced measures to be performed. She will have further discussions with the healthcare power of attorney, and sister would like to be involved with any discussions with social worker, palliative care, medical professionals. Sister name is Loran Senters contact number is [phone] (cell).  STUDIES:  1/23 CT Chest - Left upper and left lower lobe pulmonary opacities, suspicious for infection or aspiration. Fluid in the left endobronchial tree could relate to mucous plugging or aspiration. Enlarged PA.  SIGNIFICANT EVENTS: Intubated 03/14/2015. Self extubated 2 on 03/15/2015. Reintubated on 2/3>>   CULTURES: Bld Cx x 2 1/21 >>negative to date MRSA >>negative Sputum Cx 1/22>> negative  sputum culture 1/30>> negative  ANTIBIOTICS Levaquin 1/21>>1/24 Meropenem 1/24>> 2/02  Vanc 1/21>>1/24 Aztreonam 1/21>>1/24  ENDOCRINE:  -Blood glucose controlled -Mild hypernatremia with sodium of 149, improved from yesterday.  SOCIAL: -Palliative care following, patient enrolled in pace and has a healthcare power of attorney. At this time, health care power of attorney has decided to proceed with full medical care including the possibility of tracheostomy and PEG tube placement. This will need to be further discussed and finalized on Monday with palliative care, health care power of attorney and pace physician   VITAL SIGNS: Temp:  [99.1 F (37.3 C)-100.8 F (38.2 C)] 100 F (37.8 C) (02/05 0900) Pulse Rate:  [69-90] 71 (02/05 0900) Resp:  [14-17] 16 (02/05 0900) BP: (88-112)/(51-70) 93/62 mmHg (02/05 0900) SpO2:  [92 %-98 %] 94 % (02/05 0900) FiO2 (%):  [50 %] 50 % (02/05  1119) Weight:  [195 lb 5.2 oz (88.6 kg)] 195 lb 5.2 oz (88.6 kg) (02/05  0300) HEMODYNAMICS:   VENTILATOR SETTINGS: Vent Mode:  [-] PRVC FiO2 (%):  [50 %] 50 % Set Rate:  [15 bmp] 15 bmp Vt Set:  [450 mL] 450 mL PEEP:  [5 cmH20] 5 cmH20 Plateau Pressure:  [19 cmH20-22 cmH20] 19 cmH20 INTAKE / OUTPUT:  Intake/Output Summary (Last 24 hours) at 03/20/15 1328 Last data filed at 03/20/15 0500  Gross per 24 hour  Intake 3641.89 ml  Output    620 ml  Net 3021.89 ml    Review of Systems  Unable to perform ROS: intubated    Physical Exam  HENT:  Head: Normocephalic and atraumatic.  Eyes: Right eye exhibits no discharge. Left eye exhibits no discharge.  Neck: Normal range of motion. Neck supple.  Cardiovascular: Normal rate, regular rhythm and normal heart sounds.   No murmur heard. Pulmonary/Chest: She is in respiratory distress. She has wheezes. She has rales.  Abdominal: Soft. Bowel sounds are normal.  Musculoskeletal: Normal range of motion.  Neurological:  lethargic  Skin: Skin is warm.  Nursing note and vitals reviewed.    LABS:  CBC  Recent Labs Lab 03/17/15 0444 03/18/15 0617 03/19/15 0548  WBC 11.2* 9.6 14.6*  HGB 10.6* 11.2* 11.4*  HCT 33.7* 35.6 35.9  PLT 109* 109* 156   Coag's No results for input(s): APTT, INR in the last 168 hours. BMET  Recent Labs Lab 03/18/15 0617 03/19/15 0548 03/20/15 0426  NA 149* 149* 145  K 3.8 3.6 3.9  CL 110 109 109  CO2 35* 33* 33*  BUN 31* 30* 31*  CREATININE 0.73 0.89 0.99  GLUCOSE 175* 119* 143*   Electrolytes  Recent Labs Lab 03/18/15 0617 03/19/15 0548 03/19/15 1704 03/20/15 0426  CALCIUM 7.9* 7.9*  --  7.3*  MG 1.7 1.5* 2.4 2.2  PHOS 2.1* 1.2* 3.3 3.0   Sepsis Markers No results for input(s): LATICACIDVEN, PROCALCITON, O2SATVEN in the last 168 hours. ABG  Recent Labs Lab 03/17/15 0346 03/18/15 1706 03/19/15 0600  PHART 7.36 7.40 7.59*  PCO2ART 65* 54* 34  PO2ART 86 58* 75*   Liver Enzymes  Recent Labs Lab 03/14/15 0459 03/15/15 0641 03/19/15 1404   AST 33  --   --   ALT 180*  --   --   ALKPHOS 80  --   --   BILITOT 0.6  --   --   ALBUMIN 2.7* 2.4* 2.1*   Cardiac Enzymes No results for input(s): TROPONINI, PROBNP in the last 168 hours. Glucose  Recent Labs Lab 03/19/15 1126 03/19/15 1604 03/19/15 1956 03/19/15 2347 03/20/15 0739 03/20/15 1134  GLUCAP 153* 182* 109* 94 172* 152*    Imaging No results found.     I have personally obtained a history, examined the patient, evaluated laboratory and imaging results, formulated the assessment and plan and placed orders. CRITICAL CARE: The patient is critically ill with multiple organ systems failure and requires high complexity decision making for assessment and support, frequent evaluation and titration of therapies, application of advanced monitoring technologies and extensive interpretation of multiple databases. Critical Care Time devoted to patient care services described in this note is 40 minutes.    Vilinda Boehringer, MD [facility] Pager [phone] (please enter 7-digits) On Call Pager - [phone] (please enter 7-digits)

## 2015-03-20 NOTE — Progress Notes (Signed)
[facility] [place] NAME: Julie Anthony    MR#:  [id number]  DATE OF BIRTH:  1940-02-17  SUBJECTIVE:  Patient is 75 year old Caucasian female who presents to the hospital with complaints of cough, congestion, shortness of breath . On arrival to emergency room, she was found to have persistent left lower lobe pneumonia and acute renal failure and abnormal LFTs, elevated troponin of 5.5, consistent with non-STEMI . Initially managed on BiPAP , deteriorated. On February 3 and intubated due to hypotension requiring Levophed, she was obtunded due to metabolic encephalopathy due to hypercapnia. Remains intubated and sedated. Patient's family was considering terminal extubation , but now  they have changed their mind, they want patient to be continued on mechanical ventilation, including getting a tracheostomy tube, PEG tube. Patient is to meet palliative care and intensive care specialists and to discuss final arrangements tomorrow.  Patient is intubated, sedated, not able to review systems  REVIEW OF SYSTEMS:  Unable to obtain given patient's mental status /medical condition-baseline dementia with metabolic encephalopathy  DRUG ALLERGIES:   Allergies  Allergen Reactions  . Penicillins Rash  . Codeine Other (See Comments)    Unknown reaction.  . Ceftin [Cefuroxime Axetil] Hives    Unknown reaction  . Erythromycin Other (See Comments)    Unknown reaction  . Ibuprofen Other (See Comments)    Unknown reaction  . Sulfa Antibiotics Other (See Comments)    Unknown reaction    VITALS:  Blood pressure 93/62, pulse 71, temperature 100 F (37.8 C), temperature source Rectal, resp. rate 16, height 5\' 5"  (1.651 m), weight 88.6 kg (195 lb 5.2 oz), SpO2 94 %.  PHYSICAL EXAMINATION:   VITAL SIGNS: Filed Vitals:   03/20/15 0800 03/20/15 0900  BP: 90/67 93/62  Pulse: 90 71  Temp: 100.2 F (37.9 C) 100 F (37.8 C)  Resp: 15 10   GENERAL:74  y.o.female intubated and sedated,  now on mechanical ventilation Respiratory status HEAD: Normocephalic, atraumatic.  EYES: Pupils equal, round, reactive to light. Unable to assess extraocular muscles given mental status/medical condition. No scleral icterus.  MOUTH: Dry mucosal membrane. Dentition intact. No abscess noted.  EAR, NOSE, THROAT: Clear without exudates. No external lesions.  NECK: Supple. No thyromegaly. No nodules. No JVD.  PULMONARY: Coarse breath sounds throughout without wheeze rails or rhonci. No use of accessory muscles, poor respiratory effort requiring BiPAP. Poor air entry bilaterally   CHEST: Nontender to palpation.  CARDIOVASCULAR: S1 and S2. Regular rate and rhythm. No murmurs, rubs, or gallops. No edema. Pedal pulses 2+ bilaterally.  GASTROINTESTINAL: Soft, nontender, nondistended. No masses. Positive bowel sounds. No hepatosplenomegaly.  MUSCULOSKELETAL: No swelling, clubbing, or edema. Range of motion full in all extremities.  NEUROLOGIC: Unable to assess given mental status/medical condition, spontaneous movement in all extremities SKIN: No ulceration, lesions, rashes, or cyanosis. Skin warm and dry. Turgor intact.  PSYCHIATRIC: Unable to assess given mental status/medical condition       LABORATORY PANEL:   CBC  Recent Labs Lab 03/19/15 0548  WBC 14.6*  HGB 11.4*  HCT 35.9  PLT 156   ------------------------------------------------------------------------------------------------------------------  Chemistries   Recent Labs Lab 03/14/15 0459  03/20/15 0426  NA 148*  < > 145  K 4.6  < > 3.9  CL 106  < > 109  CO2 37*  < > 33*  GLUCOSE 96  < > 143*  BUN 29*  < > 31*  CREATININE 0.94  < > 0.99  CALCIUM 8.3*  < > 7.3*  MG  --   < > 2.2  AST 33  --   --   ALT 180*  --   --   ALKPHOS 80  --   --   BILITOT 0.6  --   --   < > = values in this interval not  displayed. ------------------------------------------------------------------------------------------------------------------  Cardiac Enzymes No results for input(s): TROPONINI in the last 168 hours. ------------------------------------------------------------------------------------------------------------------  RADIOLOGY:  Dg Chest 1 View  03/19/2015  CLINICAL DATA:  Dyspnea, RIGHT breast cancer EXAM: CHEST 1 VIEW COMPARISON:  Radiograph 03/18/2015 FINDINGS: Endotracheal tube at the carina. RIGHT central venous line and NG tube unchanged. There is a decrease in lung volumes and increase in basilar atelectasis. There is increase in perihilar airspace disease. IMPRESSION: 1. Endotracheal tube at carina.  Consider retraction by several cm. 2. Increased in a pulmonary edema pattern and pleural effusions. 3. Decrease in lung volumes. These results will be called to the ordering clinician or representative by the Radiologist Assistant, and communication documented in the PACS or zVision Dashboard. Electronically Signed   By: Suzy Bouchard M.D.   On: 03/19/2015 09:25   Dg Abd 1 View  03/18/2015  CLINICAL DATA:  Nasogastric catheter placement EXAM: ABDOMEN - 1 VIEW COMPARISON:  03/15/2015 FINDINGS: A nasogastric catheter is noted within the stomach. Endotracheal tube is seen approximately 2 cm above the carina. Scattered large and small bowel gas is noted. No free air is seen. Bilateral effusions are noted with basilar changes. A right PICC line is noted. IMPRESSION: Nasogastric catheter within the stomach. Electronically Signed   By: Inez Catalina M.D.   On: 03/18/2015 16:04   Dg Chest Port 1 View  03/18/2015  CLINICAL DATA:  Status post intubation.  Respiratory failure. EXAM: PORTABLE CHEST 1 VIEW COMPARISON:  03/17/2015 FINDINGS: The ET tube tip is above the carina. There is a nasogastric tube with tip in the stomach. The side port is well below the GE junction. PICC line tip is in the projection of the  SVC. Heart size appears normal. There are bilateral pleural effusions noted left greater night. Airspace opacification within the left midlung and left lower lobe is similar to previous exam. IMPRESSION: 1. No change in bilateral pleural effusions and diminished aeration to the left lung. Electronically Signed   By: Kerby Moors M.D.   On: 03/18/2015 16:05    EKG:   Orders placed or performed during the hospital encounter of 03/02/2015  . ED EKG  . ED EKG    ASSESSMENT AND PLAN:   75 year old Caucasian female with chronic respiratory failure on 2 L of oxygen through nasal cannula at home, who lives alone presents to the hospital with complaints of cough, chest congestion as well as shortness of breath. Admitted 02/20/2015  1. Acute on chronic respiratory failure with hypoxia and hypercapnia due to acute exacerbation of COPD, left lower lobe pneumonia, concerning for aspiration pneumonia, Meropenem completed 03/17/15 Continued need for BiPAP Initially , now intubated , continue current therapy , pulmonologist was consulted and the care is appreciated . The pulmonologist feels that patient's condition is not improving and she will likely need to have tracheostomy done and placement of feeding tube and transferred to long-term acute care facility. Final discussions will be held with healthcare power of attorney, PACE M.D. and palliative care on Monday, which is to be scheduled. continue weaning as tolerated   2. Hypernatremia, resolved on tube feeds, follow closely, dietitian is involved  3. Acute renal failure, resolved   4. Non-Q-wave MI, type 2, due to demand ischemia, per cardiologist, continue aspirin, heparin , nitrates, atenolol, no intervention was recommended  5. Elevated transaminases , improved  6. Venous thromboembolism prophylactic: Lovenox  7. Acute on chronic encephalopathy in the setting of baseline dementia worsened by hypercapnia/hypoxia  Patient's family/power of attorney  for medical care now wishes to pursue trach/PEG, final determination of further care tomorrow. Critical care, Care management/ palliative care input noted and appreciated     All the records are reviewed and case discussed with Care Management/Social Workerr. Management plans discussed with the patient, family and they are in agreement.  CODE STATUS: Full  TOTAL TIME TAKING CARE OF THIS PATIENT:  21 Minutes   discussed with pulmonologist/intensivist   POSSIBLE D/C IN 5 DAYS, DEPENDING ON CLINICAL CONDITION.   Theodoro Grist M.D on 03/20/2015 at 2:30 PM  Between 7am to 6pm - Pager - [phone]  After 6pm: House Pager: - [phone]  [facility]  Office  [phone]  CC: Primary care physician; [place]

## 2015-03-21 DIAGNOSIS — L8990 Pressure ulcer of unspecified site, unspecified stage: Secondary | ICD-10-CM | POA: Insufficient documentation

## 2015-03-21 LAB — CBC
HEMATOCRIT: 35.2 % (ref 35.0–47.0)
HEMOGLOBIN: 11.4 g/dL — AB (ref 12.0–16.0)
MCH: 31.3 pg (ref 26.0–34.0)
MCHC: 32.3 g/dL (ref 32.0–36.0)
MCV: 96.9 fL (ref 80.0–100.0)
Platelets: 151 10*3/uL (ref 150–440)
RBC: 3.63 MIL/uL — AB (ref 3.80–5.20)
RDW: 16.4 % — AB (ref 11.5–14.5)
WBC: 10.5 10*3/uL (ref 3.6–11.0)

## 2015-03-21 LAB — BASIC METABOLIC PANEL
ANION GAP: 5 (ref 5–15)
BUN: 38 mg/dL — ABNORMAL HIGH (ref 6–20)
CALCIUM: 7.4 mg/dL — AB (ref 8.9–10.3)
CHLORIDE: 106 mmol/L (ref 101–111)
CO2: 30 mmol/L (ref 22–32)
Creatinine, Ser: 1.18 mg/dL — ABNORMAL HIGH (ref 0.44–1.00)
GFR calc non Af Amer: 44 mL/min — ABNORMAL LOW (ref >60)
GFR, EST AFRICAN AMERICAN: 51 mL/min — AB (ref >60)
GLUCOSE: 130 mg/dL — AB (ref 65–99)
POTASSIUM: 4 mmol/L (ref 3.5–5.1)
Sodium: 141 mmol/L (ref 135–145)

## 2015-03-21 LAB — MAGNESIUM: Magnesium: 2 mg/dL (ref 1.7–2.4)

## 2015-03-21 LAB — GLUCOSE, CAPILLARY
GLUCOSE-CAPILLARY: 133 mg/dL — AB (ref 65–99)
GLUCOSE-CAPILLARY: 148 mg/dL — AB (ref 65–99)
Glucose-Capillary: 113 mg/dL — ABNORMAL HIGH (ref 65–99)
Glucose-Capillary: 131 mg/dL — ABNORMAL HIGH (ref 65–99)
Glucose-Capillary: 179 mg/dL — ABNORMAL HIGH (ref 65–99)
Glucose-Capillary: 109 mg/dL — ABNORMAL HIGH (ref 65–99)

## 2015-03-21 LAB — TRIGLYCERIDES: Triglycerides: 73 mg/dL (ref <150)

## 2015-03-21 LAB — PHOSPHORUS: PHOSPHORUS: 3.3 mg/dL (ref 2.5–4.6)

## 2015-03-21 MED ORDER — MIDAZOLAM HCL 2 MG/2ML IJ SOLN
1.0000 mg | INTRAMUSCULAR | Status: DC | PRN
  Administered 2015-03-22: 1 mg via INTRAVENOUS
  Filled 2015-03-21: qty 2

## 2015-03-21 MED ORDER — CLOPIDOGREL BISULFATE 75 MG PO TABS
75.0000 mg | ORAL_TABLET | Freq: Every day | ORAL | Status: DC
  Administered 2015-03-22 – 2015-03-24 (×3): 75 mg
  Filled 2015-03-21 (×3): qty 1

## 2015-03-21 MED ORDER — MIDAZOLAM HCL 2 MG/2ML IJ SOLN
1.0000 mg | INTRAMUSCULAR | Status: AC | PRN
  Administered 2015-03-21 – 2015-03-22 (×3): 1 mg via INTRAVENOUS
  Filled 2015-03-21 (×3): qty 2

## 2015-03-21 MED ORDER — IPRATROPIUM-ALBUTEROL 0.5-2.5 (3) MG/3ML IN SOLN
3.0000 mL | Freq: Four times a day (QID) | RESPIRATORY_TRACT | Status: DC
  Administered 2015-03-21 – 2015-03-24 (×13): 3 mL via RESPIRATORY_TRACT
  Filled 2015-03-21 (×13): qty 3

## 2015-03-21 MED ORDER — DOCUSATE SODIUM 50 MG/5ML PO LIQD
100.0000 mg | Freq: Two times a day (BID) | ORAL | Status: DC
  Administered 2015-03-21 – 2015-03-22 (×3): 100 mg
  Filled 2015-03-21 (×2): qty 10

## 2015-03-21 MED ORDER — MIRTAZAPINE 15 MG PO TABS
7.5000 mg | ORAL_TABLET | Freq: Every day | ORAL | Status: DC
  Administered 2015-03-21 – 2015-03-23 (×3): 7.5 mg
  Filled 2015-03-21 (×3): qty 1

## 2015-03-21 MED ORDER — ANTISEPTIC ORAL RINSE SOLUTION (CORINZ)
7.0000 mL | OROMUCOSAL | Status: DC
Start: 2015-03-21 — End: 2015-03-24
  Administered 2015-03-21 – 2015-03-24 (×27): 7 mL via OROMUCOSAL
  Filled 2015-03-21 (×35): qty 7

## 2015-03-21 MED ORDER — LEVOTHYROXINE SODIUM 25 MCG PO TABS
175.0000 ug | ORAL_TABLET | Freq: Every day | ORAL | Status: DC
  Administered 2015-03-21: 175 ug
  Filled 2015-03-21: qty 1

## 2015-03-21 MED ORDER — ASPIRIN 81 MG PO CHEW
81.0000 mg | CHEWABLE_TABLET | Freq: Every day | ORAL | Status: DC
  Administered 2015-03-22 – 2015-03-24 (×3): 81 mg
  Filled 2015-03-21 (×3): qty 1

## 2015-03-21 MED ORDER — CHLORHEXIDINE GLUCONATE 0.12% ORAL RINSE (MEDLINE KIT)
15.0000 mL | Freq: Two times a day (BID) | OROMUCOSAL | Status: DC
Start: 2015-03-21 — End: 2015-03-24
  Administered 2015-03-21 – 2015-03-24 (×6): 15 mL via OROMUCOSAL
  Filled 2015-03-21 (×8): qty 15

## 2015-03-21 NOTE — Progress Notes (Addendum)
Spoke with Pace MD in person about family dynamics and family meeting (Care Manager had been attempting to contact her). Pace MD willing to be present for family meeting, just contact [place] and let her know. Contacted HCPOA and spoke to both [facility]) and her husband. They had spoken with Dr. Alva Garnet today and cannot come back to unit until Thursday. They would like to meet with the Pace MD and Dr. Megan Salon and/or the Intensivist on Thursday at 10:30 (they had requested 10:00 but RN explained that patient rounds were 10:00 and that might not be feasible). They DO NOT want the sister present for this meeting. Elmo Putt stated "this woman claiming to be Marshay's sister showed up recently and I have not seen her in the 25 years I have known [Daci] and not once has she helped [Nakhia] when she needed it over the years." Elmo Putt and her husband also stated that sister was "rude and hateful" and had stated that she would contest the [employer] paperwork from 2012. They stated they did not want staff to call the sister and to only give her information if she called the staff and had the password.

## 2015-03-21 NOTE — Progress Notes (Signed)
Paged elink concerning pt becoming very agitated, gave fentanyl bolus with no relief.  Pt's eyes wide, flailing arms and legs.  Will not follow commands.  Fentanyl increased to 150 mcgs with no relief.  MD ordered prn versed for agitation.  Given to pt with relief noted.

## 2015-03-21 NOTE — Progress Notes (Signed)
[place] NOTE  Pharmacy Consult for electrolyte management Indication: hypomagnesemia, hypophosphatemia, hypokalemia   Vital Signs: Temp: 99.7 F (37.6 C) (02/06 0400) BP: 98/57 mmHg (02/06 0500) Pulse Rate: 90 (02/06 0500) Intake/Output from previous day: 02/05 0701 - 02/06 0700 In: 1740.6 [I.V.:1540.6; NG/GT:200] Out: 100 [Urine:100] Intake/Output from this shift: Total I/O In: 1593.1 [I.V.:1393.1; NG/GT:200] Out: -   Assessment: Pharmacy monitoring and replacing electrolytes as needed.   Labs at 1700 today (after being replaced this morning): Mg of 2.4 WNL Phos of 3.3 WNL  Goal of Therapy:  Mg and Phos are within normal limits  Plan:  Electrolytes WNL. Will recheck with AM labs in 2 days.  Laural Benes, Pharm.D., BCPS Clinical Pharmacist 03/21/2015,5:53 AM

## 2015-03-21 NOTE — Progress Notes (Signed)
PULMONARY / CRITICAL CARE MEDICINE   Name: Julie Anthony MRN: [id number] DOB: 30-Sep-1940    ADMISSION DATE:  03/02/2015   CHIEF COMPLAINT: SOB and cough  HISTORY OF PRESENT ILLNESS:   Julie Anthony is a 75 y.o. female has a past medical history significant for CAD, dementia, COPD/asthma, OA and breast cancer who presents to ER with persistent cough, congestion, and SOB. Was being treated by her PCP for pneumonia with Septra DS. In ER, pt found to have persistent LLL pneumonia with ARF and abnormal LFT's. Also noted to have troponin=5.5 c/w NSTEMI. She denies CP. On 02/03, she was noted to have LLL opacity, worsening respiratory distress on BiPAP, hypotension and intubated on 2/3.   SUBJECTIVE:  Remains on full vent support and mildly hypotensive with blood pressures in the low 90s  VITAL SIGNS: BP 98/53 mmHg  Pulse 86  Temp(Src) 98.4 F (36.9 C) (Oral)  Resp 15  Ht 5\' 5"  (1.651 m)  Wt 208 lb 1.8 oz (94.4 kg)  BMI 34.63 kg/m2  SpO2 95%  HEMODYNAMICS:    VENTILATOR SETTINGS: Vent Mode:  [-] PRVC FiO2 (%):  [30 %-100 %] 40 % Set Rate:  [15 bmp] 15 bmp Vt Set:  [450 mL] 450 mL PEEP:  [5 cmH20] 5 cmH20 Plateau Pressure:  [22 cmH20-26 cmH20] 26 cmH20  INTAKE / OUTPUT: I/O last 3 completed shifts: In: 5895.6 [I.V.:3288.8; NG/GT:2556.8; IV Piggyback:50] Out: 395 [Urine:395]  PHYSICAL EXAMINATION: General: Sedated  Neuro: Sedated; withdraws to pain HEENT:  PERRLA, ETT, Cardiovascular: NSR, s1/s2, no mrg; +2 pulses  Lungs: Scattered rhonchi, no wheezing Abdomen: normal BS, ND/ND Musculoskeletal:  No joint deformities Skin:  No rash, warm  LABS:  BMET  Recent Labs Lab 03/19/15 0548 03/20/15 0426 03/21/15 0455  NA 149* 145 141  K 3.6 3.9 4.0  CL 109 109 106  CO2 33* 33* 30  BUN 30* 31* 38*  CREATININE 0.89 0.99 1.18*  GLUCOSE 119* 143* 130*    Electrolytes  Recent Labs Lab 03/19/15 0548 03/19/15 1704 03/20/15 0426 03/21/15 0455  CALCIUM 7.9*  --   7.3* 7.4*  MG 1.5* 2.4 2.2 2.0  PHOS 1.2* 3.3 3.0 3.3    CBC  Recent Labs Lab 03/18/15 0617 03/19/15 0548 03/21/15 0455  WBC 9.6 14.6* 10.5  HGB 11.2* 11.4* 11.4*  HCT 35.6 35.9 35.2  PLT 109* 156 151    Coag's No results for input(s): APTT, INR in the last 168 hours.  Sepsis Markers No results for input(s): LATICACIDVEN, PROCALCITON, O2SATVEN in the last 168 hours.  ABG  Recent Labs Lab 03/17/15 0346 03/18/15 1706 03/19/15 0600  PHART 7.36 7.40 7.59*  PCO2ART 65* 54* 34  PO2ART 86 58* 75*    Liver Enzymes  Recent Labs Lab 03/15/15 0641 03/19/15 1404  ALBUMIN 2.4* 2.1*    Cardiac Enzymes No results for input(s): TROPONINI, PROBNP in the last 168 hours.  Glucose  Recent Labs Lab 03/20/15 1600 03/20/15 1932 03/21/15 0007 03/21/15 0412 03/21/15 0728 03/21/15 1137  GLUCAP 165* 115* 133* 113* 131* 179*    Imaging No results found.  STUDIES:  1/23 CT Chest - Left upper and left lower lobe pulmonary opacities, suspicious for infection or aspiration. Fluid in the left endobronchial tree could relate to mucous plugging or aspiration. Enlarged PA.  SIGNIFICANT EVENTS: Intubated 03/14/2015. Self extubated 2 on 03/15/2015. Reintubated on 2/3>>   CULTURES: Bld Cx x 2 1/21 >>negative to date MRSA >>negative Sputum Cx 1/22>> negative  sputum culture  1/30>> negative  ANTIBIOTICS Levaquin 1/21>>1/24 Meropenem 1/24>> 2/02  Vanc 1/21>>1/24 Aztreonam 1/21>>1/24  ENDOCRINE:  -Blood glucose controlled -Mild hypernatremia with sodium of 149, improved from yesterday.  ASSESSMENT / PLAN:  PULMONARY A: Acute hypoxic respiratory failure(Re-intubated x 3; on 02/03)  LLL opacity c/w pneumonia with mucus plugs. Multifocal Pneumonia  P:   -Full vent support -VAP prevention -F/U CXR -Pulmicort -Duoneb -Prednisone taper  CARDIOVASCULAR A:  NSTEMI Hypotension P:  -Cont with asa and plavix -Levophed and titrate to MAP  goal  INFECTIOUS A:   HCAP P:   -Abx as above -F/U cultures   ENDOCRINE A Hypothyroidism P -Check TSH -Continue synthroid  NEUROLOGIC A:   Acute encepahlopathy P:   RASS goal: 0 to -1 -Fentanyl for vent sedation   FAMILY  - Updates: Dr. Leonidas Romberg spoke at length with patient's family. They will consider withdrawing care if no improvement in patient's status by Thursday 02/09  - Inter-disciplinary family meet or Palliative Care meeting due by:  02/09    Best Practice: Code Status:  Full. Diet: NPO/Tube feeds GI prophylaxis:  PPI. VTE prophylaxis:  SCD's / lovenox.    CC time 35 minutes  Magdalene S. [facility] ANP-BC Pulmonary and [place] Pager: [phone]  03/21/2015, 3:44 PM  PCCM ATTENDING ATTESTATION I have evaluated patient with ANP Julie Anthony, reviewed database in its entirety and discussed care plan in detail. In addition, this patient was discussed on multidisciplinary rounds.   Important exam findings: Very frail Intubated, sedated, not F/C Coarse diffuse rhonchi CXR with extensive bilateral infiltrates Still requiring full vent support Vasopressor dependent hypotension   Major problems addressed by PCCM team: VDRF - repeated failure of extubation  Very poor candidate for Hypotension, shock AKI, oliguric   PLAN/REC: I spoke at length with the patient's pastor and his wife. The two of them function as his HCPOA which she designated many years ago. I explained that the likelihood of her recovering from this illness in a way that could be celebrated as a good outcome is vanishingly small. Further, I indicated that the likelihood of achieving such an outcome after even longer VDRF including trach and G tube would be essentially nil. They know her well and are certain that she would not wish to pursue such a course. They indicate that they intend to pursue extubation on Thurs 02/09. This will be either a terminal extubation  or a one way extubation depending on whether she makes any progress in the ensuing 2 days. I assured them that we would not let her suffer. I have made her DNR on the basis of our discussion and the HCPOAs would not want to escalate in any substantial way. We specifically agreed that we would not pursue HD under any circumstances   CCM time: 45 mins The above time includes time spent in consultation with patient and/or family members and reviewing care plan on multidisciplinary rounds  Merton Border, MD PCCM service Mobile [phone] Pager [phone]

## 2015-03-21 NOTE — Progress Notes (Signed)
[place] NOTE  Pharmacy Consult for Electrolyte and Phenytoin Monitoring      Allergies  Allergen Reactions  . Penicillins Rash  . Codeine Other (See Comments)    Unknown reaction.  . Ceftin [Cefuroxime Axetil] Hives    Unknown reaction  . Erythromycin Other (See Comments)    Unknown reaction  . Ibuprofen Other (See Comments)    Unknown reaction  . Sulfa Antibiotics Other (See Comments)    Unknown reaction    Patient Measurements: Height: 5\' 5"  (165.1 cm) Weight: 208 lb 1.8 oz (94.4 kg) IBW/kg (Calculated) : 57  Vital Signs: Temp: 98.9 F (37.2 C) (02/06 0800) Temp Source: Oral (02/06 0800) BP: 74/48 mmHg (02/06 1045) Pulse Rate: 95 (02/06 1045) Intake/Output from previous day: 02/05 0701 - 02/06 0700 In: 3533.7 [I.V.:1833.7; NG/GT:1700] Out: 275 [Urine:275] Intake/Output from this shift: Total I/O In: 681.2 [I.V.:391.2; NG/GT:240; IV Piggyback:50] Out: 45 [Urine:45] Vent settings for last 24 hours: Vent Mode:  [-] PRVC FiO2 (%):  [30 %-100 %] 50 % Set Rate:  [15 bmp] 15 bmp Vt Set:  [450 mL] 450 mL PEEP:  [5 cmH20] 5 cmH20 Plateau Pressure:  [22 cmH20-26 cmH20] 26 cmH20  Labs:  Recent Labs  03/19/15 0548 03/19/15 1404 03/19/15 1704 03/20/15 0426 03/21/15 0455  WBC 14.6*  --   --   --  10.5  HGB 11.4*  --   --   --  11.4*  HCT 35.9  --   --   --  35.2  PLT 156  --   --   --  151  CREATININE 0.89  --   --  0.99 1.18*  MG 1.5*  --  2.4 2.2 2.0  PHOS 1.2*  --  3.3 3.0 3.3  ALBUMIN  --  2.1*  --   --   --    Estimated Creatinine Clearance: 46.1 mL/min (by C-G formula based on Cr of 1.18).   Recent Labs  03/21/15 0007 03/21/15 0412 03/21/15 0728  GLUCAP 133* 113* 131*    Microbiology: Recent Results (from the past 720 hour(s))  Culture, blood (Routine X 2) w Reflex to ID Panel     Status: None   Collection Time: 02/13/2015  1:00 PM  Result Value Ref Range Status   Specimen Description BLOOD LEFT ANTECUBITAL  Final   Special Requests BOTTLES DRAWN AEROBIC AND ANAEROBIC  1CC  Final   Culture NO GROWTH 5 DAYS  Final   Report Status 03/10/2015 FINAL  Final  Culture, blood (Routine X 2) w Reflex to ID Panel     Status: None   Collection Time: 03/04/2015  1:04 PM  Result Value Ref Range Status   Specimen Description BLOOD LEFT HAND  Final   Special Requests BOTTLES DRAWN AEROBIC AND ANAEROBIC  1CC  Final   Culture NO GROWTH 5 DAYS  Final   Report Status 03/10/2015 FINAL  Final  MRSA PCR Screening     Status: None   Collection Time: 03/07/15  5:32 PM  Result Value Ref Range Status   MRSA by PCR NEGATIVE NEGATIVE Final    Comment:        The GeneXpert MRSA Assay (FDA approved for NASAL specimens only), is one component of a comprehensive MRSA colonization surveillance program. It is not intended to diagnose MRSA infection nor to guide or monitor treatment for MRSA infections.   MRSA PCR Screening     Status: None   Collection Time: 03/11/15 11:20 AM  Result Value Ref Range Status   MRSA by PCR NEGATIVE NEGATIVE Final    Comment:        The GeneXpert MRSA Assay (FDA approved for NASAL specimens only), is one component of a comprehensive MRSA colonization surveillance program. It is not intended to diagnose MRSA infection nor to guide or monitor treatment for MRSA infections.   Culture, respiratory (NON-Expectorated)     Status: None   Collection Time: 03/14/15  9:28 PM  Result Value Ref Range Status   Specimen Description TRACHEAL ASPIRATE  Final   Special Requests Normal  Final   Gram Stain MODERATE WBC SEEN NO ORGANISMS SEEN   Final   Culture NO GROWTH 3 DAYS  Final   Report Status 03/17/2015 FINAL  Final    Medications:  Scheduled:  . antiseptic oral rinse  7 mL Mouth Rinse QID  . [START ON 03/22/2015] aspirin  81 mg Per Tube Daily  . budesonide (PULMICORT) nebulizer solution  0.5 mg Nebulization BID  . chlorhexidine gluconate  15 mL Mouth Rinse BID  . [START ON 03/22/2015]  clopidogrel  75 mg Per Tube Daily  . docusate  100 mg Per Tube BID  . enoxaparin (LOVENOX) injection  40 mg Subcutaneous Q24H  . famotidine (PEPCID) IV  20 mg Intravenous Q12H  . free water  200 mL Per Tube 3 times per day  . ipratropium-albuterol  3 mL Nebulization Q6H  . lactulose  30 g Oral BID  . levothyroxine  175 mcg Per Tube QHS  . loratadine  10 mg Oral Daily  . mirtazapine  7.5 mg Per Tube QHS  . phenytoin (DILANTIN) IV  100 mg Intravenous 3 times per day  . [START ON 03/22/2015] predniSONE  10 mg Oral Q breakfast  . sodium chloride  3 mL Intravenous Q12H  . sodium chloride flush  10-40 mL Intracatheter Q12H   Infusions:  . feeding supplement (VITAL HIGH PROTEIN) 1,000 mL (03/21/15 0600)  . fentaNYL 10 mcg/ml infusion 100 mcg/hr (03/21/15 0933)  . norepinephrine (LEVOPHED) Adult infusion 8 mcg/min (03/21/15 1045)    Assessment: Pharmacy consulted to monitor phenytoin for [name].  Plan:  1. Phenytoin: Level 2/4 obtained 9.2, albumin 2.1, corrected phenytoin level 17.9. Phenytoin level is within goal range; therefore will continue current regimen of Phenytoin 100 mg IV q8 hours.   Pharmacy will continue to monitor and adjust per consult.    Ulice Dash D 03/21/2015,12:08 PM

## 2015-03-21 NOTE — Progress Notes (Addendum)
[facility] at [place] NAME: Julie Anthony    MR#:  [id number]  DATE OF BIRTH:  1941-01-20  SUBJECTIVE:  Patient is 75 year old Caucasian female who presents to the hospital with complaints of cough, congestion, shortness of breath . On arrival to emergency room, she was found to have persistent left lower lobe pneumonia and acute renal failure and abnormal LFTs, elevated troponin of 5.5, consistent with non-STEMI . Initially managed on BiPAP , deteriorated. On February 3 and intubated due to hypotension requiring Levophed, she was obtunded due to metabolic encephalopathy due to hypercapnia. Remains intubated and sedated. Patient's family was considering terminal extubation , but now  they have changed their mind, they want patient to be continued on mechanical ventilation, including getting a tracheostomy tube, PEG tube.  Patient is intubated, sedated, not able to review systems  REVIEW OF SYSTEMS:  Unable to obtain given patient's mental status /medical condition-baseline dementia with metabolic encephalopathy  DRUG ALLERGIES:   Allergies  Allergen Reactions  . Penicillins Rash  . Codeine Other (See Comments)    Unknown reaction.  . Ceftin [Cefuroxime Axetil] Hives    Unknown reaction  . Erythromycin Other (See Comments)    Unknown reaction  . Ibuprofen Other (See Comments)    Unknown reaction  . Sulfa Antibiotics Other (See Comments)    Unknown reaction    VITALS:  Blood pressure 87/55, pulse 92, temperature 98.4 F (36.9 C), temperature source Oral, resp. rate 15, height 5\' 5"  (1.651 m), weight 94.4 kg (208 lb 1.8 oz), SpO2 98 %.  PHYSICAL EXAMINATION:   VITAL SIGNS: Filed Vitals:   03/21/15 1300 03/21/15 1324  BP: 87/55   Pulse: 92 92  Temp:    Resp: 67 15   GENERAL:74 y.o.female intubated and sedated,  now on mechanical ventilation Respiratory status HEAD: Normocephalic, atraumatic.  EYES: Pupils equal, round,  reactive to light. Unable to assess extraocular muscles given mental status/medical condition. No scleral icterus.  MOUTH: Dry mucosal membrane. Dentition intact. No abscess noted.  EAR, NOSE, THROAT: Clear without exudates. No external lesions.  NECK: Supple. No thyromegaly. No nodules. No JVD.  PULMONARY: Coarse breath sounds throughout without wheeze rails or rhonci. No use of accessory muscles, poor respiratory effort requiring BiPAP. Poor air entry bilaterally   CHEST: Nontender to palpation.  CARDIOVASCULAR: S1 and S2. Regular rate and rhythm. No murmurs, rubs, or gallops. No edema. Pedal pulses 2+ bilaterally.  GASTROINTESTINAL: Soft, nontender, nondistended. No masses. Positive bowel sounds. No hepatosplenomegaly.  MUSCULOSKELETAL: No swelling, clubbing, or edema. Range of motion full in all extremities.  NEUROLOGIC: Unable to assess given mental status/medical condition, spontaneous movement in all extremities SKIN: No ulceration, lesions, rashes, or cyanosis. Skin warm and dry. Turgor intact.  PSYCHIATRIC: Unable to assess given mental status/medical condition       LABORATORY PANEL:   CBC  Recent Labs Lab 03/21/15 0455  WBC 10.5  HGB 11.4*  HCT 35.2  PLT 151   ------------------------------------------------------------------------------------------------------------------  Chemistries   Recent Labs Lab 03/21/15 0455  NA 141  K 4.0  CL 106  CO2 30  GLUCOSE 130*  BUN 38*  CREATININE 1.18*  CALCIUM 7.4*  MG 2.0   ------------------------------------------------------------------------------------------------------------------  Cardiac Enzymes No results for input(s): TROPONINI in the last 168 hours. ------------------------------------------------------------------------------------------------------------------  RADIOLOGY:  No results found.  EKG:   Orders placed or performed during the hospital encounter of 03/02/2015  . ED EKG  . ED EKG  ASSESSMENT AND PLAN:   75 year old Caucasian female with chronic respiratory failure on 2 L of oxygen through nasal cannula at home, who lives alone presents to the hospital with complaints of cough, chest congestion as well as shortness of breath. Admitted 03/08/2015  1. Acute on chronic respiratory failure with hypoxia and hypercapnia due to acute exacerbation of COPD, left lower lobe pneumonia, concerning for aspiration pneumonia, Meropenem completed 03/17/15 Continued need for BiPAP Initially , now intubated , continue current therapy , pulmonologist was consulted and their care is appreciated. The pulmonologist feels that patient's condition is not improving and she will need to have tracheostomy done and placement of feeding tube and transferred to long-term acute care facility. Final discussions will be held with healthcare power of attorney, PACE M.D. and palliative care on Monday, which is to be scheduled. continue weaning as tolerated   2. Hypernatremia, resolved on tube feeds, follow closely, dietitian is involved  3. Acute renal failure, resolved   4. Non-Q-wave MI, type 2, due to demand ischemia, per cardiologist, continue aspirin, heparin , nitrates, atenolol, no intervention was recommended by Cardiologist  5. Elevated transaminases , improved  6. Venous thromboembolism prophylactic: Lovenox  7. Acute metabolic  encephalopathy in the setting of baseline dementia due to hypercapnia/hypoxia, follow clinically. When patient is off sedation  Patient's family/power of attorney for medical care now wishes to pursue trach/PEG, final determination of further care today per prior record. Critical care, Care management/ palliative care input noted and appreciated     All the records are reviewed and case discussed with Care Management/Social Workerr. Management plans discussed with the patient, family and they are in agreement.  CODE STATUS: Full  TOTAL TIME TAKING CARE OF THIS  PATIENT:  30 Minutes    .   Theodoro Grist M.D on 03/21/2015 at 1:26 PM  Between 7am to 6pm - Pager - [phone]  After 6pm: House Pager: - [phone]  [facility]  Office  [phone]  CC: Primary care physician; [place]

## 2015-03-21 NOTE — Progress Notes (Deleted)
[facility] NOTE  Pharmacy Consult for Electrolyte and Phenytoin Monitoring      Allergies  Allergen Reactions  . Penicillins Rash  . Codeine Other (See Comments)    Unknown reaction.  . Ceftin [Cefuroxime Axetil] Hives    Unknown reaction  . Erythromycin Other (See Comments)    Unknown reaction  . Ibuprofen Other (See Comments)    Unknown reaction  . Sulfa Antibiotics Other (See Comments)    Unknown reaction    Patient Measurements: Height: 5\' 5"  (165.1 cm) Weight: 208 lb 1.8 oz (94.4 kg) IBW/kg (Calculated) : 57  Vital Signs: Temp: 98.9 F (37.2 C) (02/06 0800) Temp Source: Oral (02/06 0800) BP: 74/48 mmHg (02/06 1045) Pulse Rate: 95 (02/06 1045) Intake/Output from previous day: 02/05 0701 - 02/06 0700 In: 3533.7 [I.V.:1833.7; NG/GT:1700] Out: 275 [Urine:275] Intake/Output from this shift: Total I/O In: 681.2 [I.V.:391.2; NG/GT:240; IV Piggyback:50] Out: 45 [Urine:45] Vent settings for last 24 hours: Vent Mode:  [-] PRVC FiO2 (%):  [30 %-100 %] 50 % Set Rate:  [15 bmp] 15 bmp Vt Set:  [450 mL] 450 mL PEEP:  [5 cmH20] 5 cmH20 Plateau Pressure:  [22 cmH20-26 cmH20] 26 cmH20  Labs:  Recent Labs  03/19/15 0548 03/19/15 1404 03/19/15 1704 03/20/15 0426 03/21/15 0455  WBC 14.6*  --   --   --  10.5  HGB 11.4*  --   --   --  11.4*  HCT 35.9  --   --   --  35.2  PLT 156  --   --   --  151  CREATININE 0.89  --   --  0.99 1.18*  MG 1.5*  --  2.4 2.2 2.0  PHOS 1.2*  --  3.3 3.0 3.3  ALBUMIN  --  2.1*  --   --   --    Estimated Creatinine Clearance: 46.1 mL/min (by C-G formula based on Cr of 1.18).   Recent Labs  03/21/15 0007 03/21/15 0412 03/21/15 0728  GLUCAP 133* 113* 131*    Microbiology: Recent Results (from the past 720 hour(s))  Culture, blood (Routine X 2) w Reflex to ID Panel     Status: None   Collection Time: 02/27/2015  1:00 PM  Result Value Ref Range Status   Specimen Description BLOOD LEFT ANTECUBITAL  Final   Special Requests BOTTLES DRAWN AEROBIC AND ANAEROBIC  1CC  Final   Culture NO GROWTH 5 DAYS  Final   Report Status 03/10/2015 FINAL  Final  Culture, blood (Routine X 2) w Reflex to ID Panel     Status: None   Collection Time: 02/28/2015  1:04 PM  Result Value Ref Range Status   Specimen Description BLOOD LEFT HAND  Final   Special Requests BOTTLES DRAWN AEROBIC AND ANAEROBIC  1CC  Final   Culture NO GROWTH 5 DAYS  Final   Report Status 03/10/2015 FINAL  Final  MRSA PCR Screening     Status: None   Collection Time: 03/07/15  5:32 PM  Result Value Ref Range Status   MRSA by PCR NEGATIVE NEGATIVE Final    Comment:        The GeneXpert MRSA Assay (FDA approved for NASAL specimens only), is one component of a comprehensive MRSA colonization surveillance program. It is not intended to diagnose MRSA infection nor to guide or monitor treatment for MRSA infections.   MRSA PCR Screening     Status: None   Collection Time: 03/11/15 11:20 AM  Result Value Ref Range Status   MRSA by PCR NEGATIVE NEGATIVE Final    Comment:        The GeneXpert MRSA Assay (FDA approved for NASAL specimens only), is one component of a comprehensive MRSA colonization surveillance program. It is not intended to diagnose MRSA infection nor to guide or monitor treatment for MRSA infections.   Culture, respiratory (NON-Expectorated)     Status: None   Collection Time: 03/14/15  9:28 PM  Result Value Ref Range Status   Specimen Description TRACHEAL ASPIRATE  Final   Special Requests Normal  Final   Gram Stain MODERATE WBC SEEN NO ORGANISMS SEEN   Final   Culture NO GROWTH 3 DAYS  Final   Report Status 03/17/2015 FINAL  Final    Medications:  Scheduled:  . antiseptic oral rinse  7 mL Mouth Rinse QID  . [START ON 03/22/2015] aspirin  81 mg Per Tube Daily  . budesonide (PULMICORT) nebulizer solution  0.5 mg Nebulization BID  . chlorhexidine gluconate  15 mL Mouth Rinse BID  . [START ON 03/22/2015]  clopidogrel  75 mg Per Tube Daily  . docusate  100 mg Per Tube BID  . enoxaparin (LOVENOX) injection  40 mg Subcutaneous Q24H  . famotidine (PEPCID) IV  20 mg Intravenous Q12H  . free water  200 mL Per Tube 3 times per day  . ipratropium-albuterol  3 mL Nebulization Q6H  . lactulose  30 g Oral BID  . levothyroxine  175 mcg Per Tube QHS  . loratadine  10 mg Oral Daily  . mirtazapine  7.5 mg Per Tube QHS  . phenytoin (DILANTIN) IV  100 mg Intravenous 3 times per day  . [START ON 03/22/2015] predniSONE  10 mg Oral Q breakfast  . sodium chloride  3 mL Intravenous Q12H  . sodium chloride flush  10-40 mL Intracatheter Q12H   Infusions:  . feeding supplement (VITAL HIGH PROTEIN) 1,000 mL (03/21/15 0600)  . fentaNYL 10 mcg/ml infusion 100 mcg/hr (03/21/15 0933)  . norepinephrine (LEVOPHED) Adult infusion 8 mcg/min (03/21/15 1045)    Assessment: Pharmacy consulted to monitor electrolytes and phenytoin for [name].  Plan:  1. Phenytoin: Level 1/31 obtained 9.2, albumin 2.1, corrected phenytoin level 17.9. Phenytoin level is within goal range; therefore will continue current regimen of Phenytoin 100 mg IV q8 hours. Will recheck albumin and phenytoin level with am labs on 2/7.    2. Electrolytes: electrolytes are within normal limits. Will continue daily monitoring while patient is requiring ICU.    Pharmacy will continue to monitor and adjust per consult.    Simpson,Michael L 03/21/2015,12:04 PM

## 2015-03-21 NOTE — Progress Notes (Signed)
Chaplain rounded in the unit and provided a compassionate presence and support to the patient. Said silent prayer. Chaplain Mehtaab Mayeda [phone] 

## 2015-03-21 NOTE — Progress Notes (Signed)
Nutrition Follow-up    INTERVENTION:   EN: recommend continuing current TF regimen as per order set   NUTRITION DIAGNOSIS:   Inadequate oral intake related to inability to eat as evidenced by NPO status.  Being addressed via TF  GOAL:   Patient will meet greater than or equal to 90% of their needs  MONITOR:    (Energy Intake, Anthropometrics, Pulmonary Profile, Digestive System)  REASON FOR ASSESSMENT:   LOS    ASSESSMENT:   Pt remains sedated on vent Palliative care and [place] to meet with family tomorrow to further determine poc  Diet Order:   NPO  EN: tolerating Vital High Protein at rate of 50 ml/hr  Digestive System: abdomen soft, BS hypoactive, +BM, no signs of TF intolerance  Skin:   (stage I coccyx)  Last BM:  03/21/15   Electrolyte and Renal Profile:  Recent Labs Lab 03/19/15 0548 03/19/15 1704 03/20/15 0426 03/21/15 0455  BUN 30*  --  31* 38*  CREATININE 0.89  --  0.99 1.18*  NA 149*  --  145 141  K 3.6  --  3.9 4.0  MG 1.5* 2.4 2.2 2.0  PHOS 1.2* 3.3 3.0 3.3   Glucose Profile:   Recent Labs  03/21/15 0412 03/21/15 0728 03/21/15 1137  GLUCAP 113* 131* 179*   Meds: prednisone, fentanyl, lactulose, remeron  Height:   Ht Readings from Last 1 Encounters:  03/19/15 5\' 5"  (1.651 m)    Weight:   Wt Readings from Last 1 Encounters:  03/21/15 208 lb 1.8 oz (94.4 kg)    Filed Weights   03/19/15 1041 03/20/15 0300 03/21/15 0535  Weight: 187 lb (84.823 kg) 195 lb 5.2 oz (88.6 kg) 208 lb 1.8 oz (94.4 kg)    BMI:  Body mass index is 34.63 kg/(m^2).  Estimated Nutritional Needs:   Kcal:  (11-14 kcals/kg) Using wt of 85kg) [phone] kcals/d.   Protein:  (2.0-2.5 g/kg) Using IBW of 57kg 114-143 g/d  Fluid:  (25-46ml/kg) 2125-2520ml/d  EDUCATION NEEDS:   Education needs no appropriate at this time  [place], [place], LDN [phone] Pager  [phone] Weekend/On-Call Pager

## 2015-03-21 NOTE — Care Management (Signed)
Reaching out to  PACE MD, HCPOA  to reassess current treatment plan.  Was informed that patient has had some family members that have verbalized that patient would never have wanted this done (intubation)- but they doe not have the hcpoa.Marland Kitchen

## 2015-03-22 ENCOUNTER — Telehealth: Payer: Self-pay | Admitting: *Deleted

## 2015-03-22 ENCOUNTER — Inpatient Hospital Stay: Payer: Medicare (Managed Care)

## 2015-03-22 DIAGNOSIS — R918 Other nonspecific abnormal finding of lung field: Secondary | ICD-10-CM | POA: Insufficient documentation

## 2015-03-22 DIAGNOSIS — J9601 Acute respiratory failure with hypoxia: Secondary | ICD-10-CM | POA: Insufficient documentation

## 2015-03-22 DIAGNOSIS — I959 Hypotension, unspecified: Secondary | ICD-10-CM

## 2015-03-22 LAB — GLUCOSE, CAPILLARY
GLUCOSE-CAPILLARY: 113 mg/dL — AB (ref 65–99)
GLUCOSE-CAPILLARY: 122 mg/dL — AB (ref 65–99)
GLUCOSE-CAPILLARY: 135 mg/dL — AB (ref 65–99)
GLUCOSE-CAPILLARY: 161 mg/dL — AB (ref 65–99)
GLUCOSE-CAPILLARY: 173 mg/dL — AB (ref 65–99)

## 2015-03-22 LAB — TSH: TSH: 9.678 u[IU]/mL — ABNORMAL HIGH (ref 0.350–4.500)

## 2015-03-22 MED ORDER — PROPOFOL 1000 MG/100ML IV EMUL
5.0000 ug/kg/min | INTRAVENOUS | Status: DC
  Administered 2015-03-22: 25 ug/kg/min via INTRAVENOUS
  Administered 2015-03-22: 30 ug/kg/min via INTRAVENOUS
  Administered 2015-03-22: 35 ug/kg/min via INTRAVENOUS
  Administered 2015-03-23 – 2015-03-24 (×5): 40 ug/kg/min via INTRAVENOUS
  Filled 2015-03-22 (×10): qty 100

## 2015-03-22 MED ORDER — LEVOTHYROXINE SODIUM 100 MCG PO TABS
200.0000 ug | ORAL_TABLET | Freq: Every day | ORAL | Status: DC
  Administered 2015-03-22 – 2015-03-23 (×2): 200 ug
  Filled 2015-03-22 (×3): qty 2

## 2015-03-22 MED ORDER — MIDAZOLAM HCL 2 MG/2ML IJ SOLN
1.0000 mg | INTRAMUSCULAR | Status: DC | PRN
  Administered 2015-03-22 (×3): 1 mg via INTRAVENOUS
  Filled 2015-03-22 (×3): qty 2

## 2015-03-22 NOTE — Progress Notes (Signed)
[facility] at [place] NAME: Julie Anthony    MR#:  [id number]  DATE OF BIRTH:  1940/02/19  SUBJECTIVE:  Patient is 75 year old Caucasian female who presents to the hospital with complaints of cough, congestion, shortness of breath . On arrival to emergency room, she was found to have persistent left lower lobe pneumonia and acute renal failure and abnormal LFTs, elevated troponin of 5.5, consistent with non-STEMI . Initially managed on BiPAP , deteriorated. On February 3 and intubated due to hypotension requiring Levophed, she was obtunded due to metabolic encephalopathy due to hypercapnia. Remains intubated and sedated. Patient's family was considering terminal extubation , but then they changed their mind, Dr. Jamal Collin discussed this patient's family and terminal extubation is being planned on February 9 . Patient is intubated, sedated, not able to review systems  REVIEW OF SYSTEMS:  Unable to obtain given patient's mental status /medical condition-baseline dementia with metabolic encephalopathy  DRUG ALLERGIES:   Allergies  Allergen Reactions  . Penicillins Rash  . Codeine Other (See Comments)    Unknown reaction.  . Ceftin [Cefuroxime Axetil] Hives    Unknown reaction  . Erythromycin Other (See Comments)    Unknown reaction  . Ibuprofen Other (See Comments)    Unknown reaction  . Sulfa Antibiotics Other (See Comments)    Unknown reaction    VITALS:  Blood pressure 109/57, pulse 96, temperature 99.4 F (37.4 C), temperature source Axillary, resp. rate 10, height 5\' 5"  (1.651 m), weight 96.2 kg (212 lb 1.3 oz), SpO2 94 %.  PHYSICAL EXAMINATION:   VITAL SIGNS: Filed Vitals:   03/22/15 0800 03/22/15 1148  BP: 109/57   Pulse: 96   Temp:  99.4 F (37.4 C)  Resp: 10    GENERAL:75 y.o.female intubated and sedated,  now on mechanical ventilation. Sedation. She has occasional agitation, but not tracking and remains on full vent  support, also on low-dose of Levophed due to hypotension Respiratory status HEAD: Normocephalic, atraumatic.  EYES: Pupils equal, round, reactive to light. Unable to assess extraocular muscles given mental status/medical condition. No scleral icterus.  MOUTH: Dry mucosal membrane. Dentition intact. No abscess noted.  EAR, NOSE, THROAT: Clear without exudates. No external lesions.  NECK: Supple. No thyromegaly. No nodules. No JVD.  PULMONARY: Coarse breath sounds throughout without wheeze rails or rhonci. No use of accessory muscles, poor respiratory effort requiring BiPAP. Poor air entry bilaterally   CHEST: Nontender to palpation.  CARDIOVASCULAR: S1 and S2. Regular rate and rhythm. No murmurs, rubs, or gallops. No edema. Pedal pulses 2+ bilaterally.  GASTROINTESTINAL: Soft, nontender, nondistended. No masses. Positive bowel sounds. No hepatosplenomegaly.  MUSCULOSKELETAL: No swelling, clubbing, or edema. Range of motion full in all extremities.  NEUROLOGIC: Unable to assess given mental status/medical condition, spontaneous movement in all extremities SKIN: No ulceration, lesions, rashes, or cyanosis. Skin warm and dry. Turgor intact.  PSYCHIATRIC: Unable to assess given mental status/medical condition       LABORATORY PANEL:   CBC  Recent Labs Lab 03/21/15 0455  WBC 10.5  HGB 11.4*  HCT 35.2  PLT 151   ------------------------------------------------------------------------------------------------------------------  Chemistries   Recent Labs Lab 03/21/15 0455  NA 141  K 4.0  CL 106  CO2 30  GLUCOSE 130*  BUN 38*  CREATININE 1.18*  CALCIUM 7.4*  MG 2.0   ------------------------------------------------------------------------------------------------------------------  Cardiac Enzymes No results for input(s): TROPONINI in the last 168  hours. ------------------------------------------------------------------------------------------------------------------  RADIOLOGY:  Dg Chest Port 1 [address]  03/22/2015  CLINICAL DATA:  Respiratory failure. EXAM: PORTABLE CHEST 1 VIEW COMPARISON:  03/19/2015. FINDINGS: Endotracheal tube tip noted 9 mm above the carina. Proximal repositioning of 2-3 cm suggested . Right PICC line and NG tube in stable position. Cardiomegaly. Persistent bilateral pulmonary alveolar infiltrates consistent with pulmonary edema again noted. Slight interim improvement. Small pleural effusions cannot be excluded. No pneumothorax. IMPRESSION: 1. Endotracheal tube tip noted 9 mm above the carina. Proximal repositioning to 3 cm suggested. Remaining lines and tubes in stable position. 2. Cardiomegaly with bilateral pulmonary infiltrates suggesting pulmonary edema. Slight improvement from prior exam. Small bilateral pleural effusions. These results will be called to the ordering clinician or representative by the Radiologist Assistant, and communication documented in the PACS or zVision Dashboard. Electronically Signed   By: Marcello Moores  Register   On: 03/22/2015 07:31    EKG:   Orders placed or performed during the hospital encounter of 03/08/2015  . ED EKG  . ED EKG    ASSESSMENT AND PLAN:   75 year old Caucasian female with chronic respiratory failure on 2 L of oxygen through nasal cannula at home, who lives alone presents to the hospital with complaints of cough, chest congestion as well as shortness of breath. Admitted 02/17/2015  1. Acute on chronic respiratory failure with hypoxia and hypercapnia due to acute exacerbation of COPD, left lower lobe  aspiration pneumonia, Meropenem completed 03/17/15 Continued need for BiPAP Initially , now intubated , continue current therapy , pulmonologist was consulted and their care is appreciated. The pulmonologist/intensivist discussed case with patient's family and decided on terminal  extubation on February 9  2. Hypernatremia, resolved on tube feeds, follow closely, dietitian is involved  3. Acute renal failure, resolved   4. Non-Q-wave MI, type 2, due to demand ischemia, per cardiologist, continue aspirin, heparin , nitrates, atenolol, no intervention was recommended by Cardiologist  5. Elevated transaminases , improved  6. Venous thromboembolism prophylactic: Lovenox  7. Acute metabolic  encephalopathy in the setting of baseline dementia due to hypercapnia/hypoxia, stable clinically.   Patient's family/power of attorney for medical care desided on terminal extubation on February 9     All the records are reviewed and case discussed with Care Management/Social Workerr. Management plans discussed with the patient, family and they are in agreement.  CODE STATUS: Full  TOTAL TIME TAKING CARE OF THIS PATIENT:  30 Minutes    .   Theodoro Grist M.D on 03/22/2015 at 3:34 PM  Between 7am to 6pm - Pager - [phone]  After 6pm: House Pager: - [phone]  [facility]  Office  [phone]  CC: Primary care physician; [place]

## 2015-03-22 NOTE — Progress Notes (Signed)
eLink Physician-Brief Progress Note Patient Name: Julie Anthony DOB: [birth year] MRN: [id number]   Date of Service  03/22/2015  HPI/Events of Note  Agitation. Currently on a Fentanyl IV infusion at 150 mcg/hour and Versed PRN. Patient has already self extubated X 2.   eICU Interventions  Will order: 1. Increase ceiling on Fentanyl IV infusion to 300 mcg/hour. 2. Increase Versed to 1 mg IV Q 1 hour PRN.      Intervention Category Minor Interventions: Agitation / anxiety - evaluation and management  Sommer,Steven Eugene 03/22/2015, 3:39 AM

## 2015-03-22 NOTE — Telephone Encounter (Signed)
-----   Message from Vilinda Boehringer, MD sent at 03/10/2015  9:28 AM EST ----- Regarding: hospital follow up  Please schedule a hospital follow for this patient with Dr. Mortimer Fries within the next 2 weeks.   Thanks

## 2015-03-22 NOTE — Progress Notes (Signed)
[place] NOTE  Pharmacy Consult for Phenytoin Monitoring      Allergies  Allergen Reactions  . Penicillins Rash  . Codeine Other (See Comments)    Unknown reaction.  . Ceftin [Cefuroxime Axetil] Hives    Unknown reaction  . Erythromycin Other (See Comments)    Unknown reaction  . Ibuprofen Other (See Comments)    Unknown reaction  . Sulfa Antibiotics Other (See Comments)    Unknown reaction    Patient Measurements: Height: 5\' 5"  (165.1 cm) Weight: 212 lb 1.3 oz (96.2 kg) IBW/kg (Calculated) : 57  Vital Signs: Temp: 99.4 F (37.4 C) (02/07 1148) Temp Source: Axillary (02/07 1148) BP: 109/57 mmHg (02/07 0800) Pulse Rate: 96 (02/07 0800) Intake/Output from previous day: 02/06 0701 - 02/07 0700 In: 3154.4 [I.V.:1164.4; NG/GT:1890; IV Piggyback:100] Out: 482 [Urine:482] Intake/Output from this shift: Total I/O In: 91.3 [I.V.:41.3; NG/GT:50] Out: 175 [Urine:175] Vent settings for last 24 hours: Vent Mode:  [-] PRVC FiO2 (%):  [40 %-50 %] 40 % Set Rate:  [15 bmp] 15 bmp Vt Set:  [450 mL] 450 mL PEEP:  [5 cmH20] 5 cmH20  Labs:  Recent Labs  03/19/15 1404 03/19/15 1704 03/20/15 0426 03/21/15 0455  WBC  --   --   --  10.5  HGB  --   --   --  11.4*  HCT  --   --   --  35.2  PLT  --   --   --  151  CREATININE  --   --  0.99 1.18*  MG  --  2.4 2.2 2.0  PHOS  --  3.3 3.0 3.3  ALBUMIN 2.1*  --   --   --    Estimated Creatinine Clearance: 46.5 mL/min (by C-G formula based on Cr of 1.18).   Recent Labs  03/22/15 0005 03/22/15 0728 03/22/15 1154  GLUCAP 122* 173* 161*    Microbiology: Recent Results (from the past 720 hour(s))  Culture, blood (Routine X 2) w Reflex to ID Panel     Status: None   Collection Time: 02/26/2015  1:00 PM  Result Value Ref Range Status   Specimen Description BLOOD LEFT ANTECUBITAL  Final   Special Requests BOTTLES DRAWN AEROBIC AND ANAEROBIC  1CC  Final   Culture NO GROWTH 5 DAYS  Final   Report Status  03/10/2015 FINAL  Final  Culture, blood (Routine X 2) w Reflex to ID Panel     Status: None   Collection Time: 03/04/2015  1:04 PM  Result Value Ref Range Status   Specimen Description BLOOD LEFT HAND  Final   Special Requests BOTTLES DRAWN AEROBIC AND ANAEROBIC  1CC  Final   Culture NO GROWTH 5 DAYS  Final   Report Status 03/10/2015 FINAL  Final  MRSA PCR Screening     Status: None   Collection Time: 03/07/15  5:32 PM  Result Value Ref Range Status   MRSA by PCR NEGATIVE NEGATIVE Final    Comment:        The GeneXpert MRSA Assay (FDA approved for NASAL specimens only), is one component of a comprehensive MRSA colonization surveillance program. It is not intended to diagnose MRSA infection nor to guide or monitor treatment for MRSA infections.   MRSA PCR Screening     Status: None   Collection Time: 03/11/15 11:20 AM  Result Value Ref Range Status   MRSA by PCR NEGATIVE NEGATIVE Final    Comment:  The GeneXpert MRSA Assay (FDA approved for NASAL specimens only), is one component of a comprehensive MRSA colonization surveillance program. It is not intended to diagnose MRSA infection nor to guide or monitor treatment for MRSA infections.   Culture, respiratory (NON-Expectorated)     Status: None   Collection Time: 03/14/15  9:28 PM  Result Value Ref Range Status   Specimen Description TRACHEAL ASPIRATE  Final   Special Requests Normal  Final   Gram Stain MODERATE WBC SEEN NO ORGANISMS SEEN   Final   Culture NO GROWTH 3 DAYS  Final   Report Status 03/17/2015 FINAL  Final    Medications:  Scheduled:  . antiseptic oral rinse  7 mL Mouth Rinse 10 times per day  . aspirin  81 mg Per Tube Daily  . budesonide (PULMICORT) nebulizer solution  0.5 mg Nebulization BID  . chlorhexidine gluconate  15 mL Mouth Rinse BID  . clopidogrel  75 mg Per Tube Daily  . docusate  100 mg Per Tube BID  . enoxaparin (LOVENOX) injection  40 mg Subcutaneous Q24H  . famotidine (PEPCID)  IV  20 mg Intravenous Q12H  . free water  200 mL Per Tube 3 times per day  . ipratropium-albuterol  3 mL Nebulization Q6H  . lactulose  30 g Oral BID  . levothyroxine  175 mcg Per Tube QHS  . loratadine  10 mg Oral Daily  . mirtazapine  7.5 mg Per Tube QHS  . phenytoin (DILANTIN) IV  100 mg Intravenous 3 times per day  . sodium chloride  3 mL Intravenous Q12H  . sodium chloride flush  10-40 mL Intracatheter Q12H   Infusions:  . feeding supplement (VITAL HIGH PROTEIN) 1,000 mL (03/22/15 0700)  . fentaNYL 10 mcg/ml infusion 25 mcg/hr (03/22/15 1019)  . norepinephrine (LEVOPHED) Adult infusion 20 mcg/min (03/22/15 1148)  . propofol (DIPRIVAN) infusion 25 mcg/kg/min (03/22/15 1044)    Assessment: Pharmacy consulted to monitor phenytoin for [name].  Plan:  1. Phenytoin: Level 2/4 obtained 9.2, albumin 2.1, corrected phenytoin level 17.9. Phenytoin level is within goal range; therefore will continue current regimen of Phenytoin 100 mg IV q8 hours.   Pharmacy will continue to monitor and adjust per consult.    Ulice Dash D 03/22/2015,12:49 PM

## 2015-03-22 NOTE — Care Management (Signed)
Have spoken with Dr Megan Salon and Dr Raliegh Ip from [facility]  Coordinating meeting between care team (intensivist, PACE MD, palliative MD) with HCPOA to readdress goals of treatment.  have left message for the [facility] regarding meeting for Wed 2/8 at 4pm.  Updated patient's primary nurse.

## 2015-03-22 NOTE — Telephone Encounter (Signed)
LMOM for HCPOA Alisha to call back to schedule hosp f/u for pt since pt is still in hosp. Will wait for return call.

## 2015-03-22 NOTE — Progress Notes (Signed)
Palliative Medicine Inpatient Consult Follow Up Note   Name: Julie Anthony Date: 03/22/2015 MRN: [id number]  DOB: 30-Dec-1940  Referring Physician: Theodoro Grist, MD  Palliative Care consult requested for this 75 y.o. female for goals of medical therapy in patient with acute respiratory failure.     Current Plans ?: 1.  I see a note from [place] NP that says that a 'terminal extubation is planned for Thursday'.   I do not find any details of how this evolved other than perhaps via a conversation with Dr. Alva Garnet at some point. I am hopeful that there is now clarity in the plan for a terminal extubation this Thursday.  I will seek to learn the details with Dr. Alva Garnet tomorrow morning.    2.  I will be glad to step back and not follow pt, but it may be that I am still needed, as pt's HCPOA and husband have a need to discuss life and death from a religious perspective at times (he is a Psychologist, educational at [employer] different churches).  I am not a chaplain, but sometimes can relate to very religious family members and friends at the time of withdrawal of care, etc.  3.  I had been notified I would be needed in a meeting Wed --and now it appears to be Thursday . Not clear on need for meeting IF terminal extubation is already 'planned'.    4.  Pt's sister does not have the password and nurses are not giving out info to her.  She has apparently 'come out of nowhere' per [facility] and she has been asking to see the HCPOA form and has told nursing that her attorney told her that 'being a sister is more important that an HCPOA', etc etc.  We do need to honor HCPOA's status and having sister here at the same time as the planned meeting or extubation may be quite problematic. This warrants further planning on our part as a team.     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 COPD/ asthma Bilateral Pleural Effusions Elevated LFTs ---GI consult obtained (AST and ALT both >1k ---now near or at nl) ---Deemed to be 'shock liver' likely from ischemia / hypoxia. 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  ----she could prepare some of her own meals and was 'pretty functional' Mild Cardiomyopathy with EF 50% Leukocytosis Anemia --unclear etiology Mild Thrombocytopenia   REVIEW OF SYSTEMS:  Patient is not able to provide ROS due to being sedated and intubated  CODE STATUS: Full code   PAST MEDICAL HISTORY: Past Medical History  Diagnosis Date  . Cancer [facility])     right breast cancer  . S/P radiation therapy     right breast cancer    PAST SURGICAL HISTORY:  Past Surgical History  Procedure Laterality Date  . Abdominal hysterectomy    . Breast excisional biopsy Right     positive 2006  . Breast biopsy Left     negative 02/2012 stereotactic    Vital Signs: BP 109/57 mmHg  Pulse 96  Temp(Src) 99.4 F (37.4 C) (Axillary)  Resp 10  Ht 5\' 5"  (1.651 m)  Wt 96.2 kg (212 lb 1.3 oz)  BMI 35.29 kg/m2  SpO2 94% Filed Weights   03/20/15 0300 03/21/15 0535 03/22/15 0500  Weight: 88.6 kg (195 lb 5.2 oz) 94.4 kg (208 lb 1.8 oz) 96.2 kg (212 lb 1.3 oz)    Estimated body mass index is 35.29 kg/(m^2) as calculated from  the following:   Height as of this encounter: 5\' 5"  (1.651 m).   Weight as of this encounter: 96.2 kg (212 lb 1.3 oz).  PHYSICAL EXAM: Sedated and intubated NO JVD or TM Being suctioned copious phelgm Hrt rrr with irreg beats Lungs with ronchi and decreased BS Abd soft and NT Ext no mottling or cyanosis  LABS: CBC:    Component Value Date/Time   WBC 10.5 03/21/2015 0455   HGB 11.4* 03/21/2015 0455   HCT 35.2 03/21/2015 0455   PLT 151 03/21/2015 0455   MCV 96.9 03/21/2015 0455   NEUTROABS 10.4* 03/06/2015 1300   LYMPHSABS 0.8* 03/04/2015 1300   MONOABS 0.9 02/13/2015 1300   EOSABS 0.0 02/18/2015 1300   BASOSABS 0.0 03/04/2015 1300   Comprehensive Metabolic Panel:    Component Value Date/Time   NA 141 03/21/2015 0455   K 4.0 03/21/2015 0455   CL 106 03/21/2015 0455   CO2 30 03/21/2015 0455   BUN 38* 03/21/2015 0455   CREATININE 1.18* 03/21/2015 0455   GLUCOSE 130* 03/21/2015 0455   CALCIUM 7.4* 03/21/2015 0455   AST 33 03/14/2015 0459   ALT 180* 03/14/2015 0459   ALKPHOS 80 03/14/2015 0459   BILITOT 0.6 03/14/2015 0459   PROT 5.4* 03/14/2015 0459   ALBUMIN 2.1* 03/19/2015 1404    More than 50% of the visit was spent in counseling/coordination of care: YES  Time Spent:  35 min

## 2015-03-22 NOTE — Progress Notes (Signed)
Patient remains intubated on the ventilator. Her eyes are open but she does not focus or follow commands. She is throwing her arms around and trying to pull ETT out. PRN versed given without relief. Fentanyl bolus given.

## 2015-03-22 NOTE — Progress Notes (Signed)
Patient's sister in to visit and requested she be shown the [facility] paperwork. She says she just left her attorney and he said we have to show it to her. I told her that was incorrect and would not be providing her with the requested information. She then asked if she can come to the meeting with the physicians and HCPOA and I told her I cannot give her permission and she needs to call HCPOA.

## 2015-03-22 NOTE — Care Management (Signed)
Spoke with [place].  She is planning to meet with care team Thursday at 10:30.  She verbalizes that the outcome of the meeting may be to withdraw care.  She says that she does not wish to have patient's sister Ruby present.  This sister has not been in patient's life for at least the last five years.   Ruby has apparently gone to the funeral home and informed staff that there is no legal documents indicating that anyone but Ruby should deal with any funeral arrangements. Assured Alisha that care team fully understand that Lars Mage is the decision maker in this case and Ruby will not be involved.  Lars Mage allows that she did visit patient's brother who is in a nursing home and updated him on his sister's medical condition. Cm will not be able to attend the meeting due to prior engagement but care team would communicate outcome of meeting to CM.  CM will confirm Thursday meeting with Dr Rito Ehrlich from [facility] and Dr Megan Salon.

## 2015-03-22 NOTE — Progress Notes (Signed)
PULMONARY / CRITICAL CARE MEDICINE   Name: Julie Anthony MRN: [id number] DOB: 11-11-1940    ADMISSION DATE:  03/13/2015   CHIEF COMPLAINT: SOB and cough  HISTORY OF PRESENT ILLNESS:   Julie Anthony is a 75 y.o. female has a past medical history significant for CAD, dementia, COPD/asthma, OA and breast cancer who presents to ER with persistent cough, congestion, and SOB. Was being treated by her PCP for pneumonia with Septra DS. In ER, pt found to have persistent LLL pneumonia with ARF and abnormal LFT's. Also noted to have troponin=5.5 c/w NSTEMI. She denies CP. On 02/03, she was noted to have LLL opacity, worsening respiratory distress on BiPAP, hypotension and intubated on 2/3.   SUBJECTIVE:  Awake with occasional agitation but no tracking. Remains on full vent support; ST with rates in the low 100s. On low dose levophed. Family meeting regarding care goals planned for thursday  VITAL SIGNS: BP 109/57 mmHg  Pulse 96  Temp(Src) 99.4 F (37.4 C) (Axillary)  Resp 10  Ht 5\' 5"  (1.651 m)  Wt 212 lb 1.3 oz (96.2 kg)  BMI 35.29 kg/m2  SpO2 94%  HEMODYNAMICS:    VENTILATOR SETTINGS: Vent Mode:  [-] PRVC FiO2 (%):  [40 %] 40 % Set Rate:  [15 bmp] 15 bmp Vt Set:  [450 mL] 450 mL PEEP:  [5 cmH20] 5 cmH20  INTAKE / OUTPUT: I/O last 3 completed shifts: In: 6540.6 [I.V.:2850.6; NG/GT:3590; IV Piggyback:100] Out: 657 [Urine:657]  PHYSICAL EXAMINATION: General: Awake Neuro: Restless with mild stimulation, eye open, does not track or follow commands HEENT:  PERRLA, ETT, Cardiovascular: ST, s1/s2, no mrg; +2 pulses  Lungs: Scattered rhonchi, no wheezing Abdomen: normal BS, ND/ND Musculoskeletal:  No joint deformities Skin:  No rash, warm  LABS:  BMET  Recent Labs Lab 03/19/15 0548 03/20/15 0426 03/21/15 0455  NA 149* 145 141  K 3.6 3.9 4.0  CL 109 109 106  CO2 33* 33* 30  BUN 30* 31* 38*  CREATININE 0.89 0.99 1.18*  GLUCOSE 119* 143* 130*     Electrolytes  Recent Labs Lab 03/19/15 0548 03/19/15 1704 03/20/15 0426 03/21/15 0455  CALCIUM 7.9*  --  7.3* 7.4*  MG 1.5* 2.4 2.2 2.0  PHOS 1.2* 3.3 3.0 3.3    CBC  Recent Labs Lab 03/18/15 0617 03/19/15 0548 03/21/15 0455  WBC 9.6 14.6* 10.5  HGB 11.2* 11.4* 11.4*  HCT 35.6 35.9 35.2  PLT 109* 156 151    Coag's No results for input(s): APTT, INR in the last 168 hours.  Sepsis Markers No results for input(s): LATICACIDVEN, PROCALCITON, O2SATVEN in the last 168 hours.  ABG  Recent Labs Lab 03/17/15 0346 03/18/15 1706 03/19/15 0600  PHART 7.36 7.40 7.59*  PCO2ART 65* 54* 34  PO2ART 86 58* 75*    Liver Enzymes  Recent Labs Lab 03/19/15 1404  ALBUMIN 2.1*    Cardiac Enzymes No results for input(s): TROPONINI, PROBNP in the last 168 hours.  Glucose  Recent Labs Lab 03/21/15 1137 03/21/15 1600 03/21/15 1948 03/22/15 0005 03/22/15 0728 03/22/15 1154  GLUCAP 179* 148* 109* 122* 173* 161*    Imaging Dg Chest Port 1 View  03/22/2015  CLINICAL DATA:  Respiratory failure. EXAM: PORTABLE CHEST 1 VIEW COMPARISON:  03/19/2015. FINDINGS: Endotracheal tube tip noted 9 mm above the carina. Proximal repositioning of 2-3 cm suggested . Right PICC line and NG tube in stable position. Cardiomegaly. Persistent bilateral pulmonary alveolar infiltrates consistent with pulmonary edema again noted. Slight  interim improvement. Small pleural effusions cannot be excluded. No pneumothorax. IMPRESSION: 1. Endotracheal tube tip noted 9 mm above the carina. Proximal repositioning to 3 cm suggested. Remaining lines and tubes in stable position. 2. Cardiomegaly with bilateral pulmonary infiltrates suggesting pulmonary edema. Slight improvement from prior exam. Small bilateral pleural effusions. These results will be called to the ordering clinician or representative by the Radiologist Assistant, and communication documented in the PACS or zVision Dashboard. Electronically  Signed   By: Marcello Moores  Register   On: 03/22/2015 07:31    STUDIES:  1/23 CT Chest - Left upper and left lower lobe pulmonary opacities, suspicious for infection or aspiration. Fluid in the left endobronchial tree could relate to mucous plugging or aspiration. Enlarged PA.   SIGNIFICANT EVENTS: Intubated 03/14/2015. Self extubated 2 on 03/15/2015. Reintubated on 2/3>>   CULTURES: Bld Cx x 2 1/21 >>negative to date MRSA >>negative Sputum Cx 1/22>> negative  sputum culture 1/30>> negative  ANTIBIOTICS Levaquin 1/21>>1/24 Meropenem 1/24>> 2/02  Vanc 1/21>>1/24 Aztreonam 1/21>>1/24   ASSESSMENT / PLAN:  PULMONARY A: Acute hypoxic respiratory failure(Re-intubated x 3; on 02/03)  LLL opacity c/w pneumonia with mucus plugs. Multifocal Pneumonia  P:   -Full vent support -VAP prevention -F/U CXR -Pulmicort -Duoneb -Prednisone taper  CARDIOVASCULAR A:  NSTEMI Hypotension P:  -Cont with asa and plavix -Levophed and titrate to MAP goal  INFECTIOUS A:   HCAP- all cultures negative to date P:   -Abx completed.  -Cultures negative   ENDOCRINE A Hypothyroidism  P -Check TSH-9.67 -Increase synthroid to 200 mcg daily  NEUROLOGIC A:   Acute encepahlopathy P:   RASS goal: -1 to -2 -Fentanyl, propofol and versed for vent sedation and comfort   FAMILY  - Updates: No family at bedside during rounds on 02/02. Terminal wean planned for Thursday 02/09  - Inter-disciplinary family meet or Palliative Care meeting due by:  02/09    Best Practice: Code Status:  DNR. Diet: NPO/Tube feeds GI prophylaxis:  PPI. VTE prophylaxis:  SCD's / lovenox.    CC time 35 minutes  Magdalene S. Tukov ANP-BC Pulmonary and Critical Care Medicine [place] Pager: [phone]  03/22/2015, 2:17 PM  PCCM ATTENDING ATTESTATION: I have evaluated patient with ANP Julie Anthony, reviewed database in its entirety and discussed care plan in detail. In addition, this patient was  discussed on multidisciplinary rounds.   Important exam findings: Remains intubated and unable to wean in PSV mode Remains intermittently agitated requiring high dose infusion of fentanyl CXR continues to demonstrate severe bilateral infiltrates Remains on vasopressors  Major problems addressed by PCCM team: VDRF hypotension Poor prognosis   PLAN/REC: Continue vent support Continue vasopressors Continue fentanyl I continue to strongly encourage compassionate discontinuation of life sustaining therapies and full comfort care. Appreciate Palliative Care involvement    Merton Border, MD PCCM service Mobile [phone] Pager [phone]

## 2015-03-23 LAB — BASIC METABOLIC PANEL
Anion gap: 6 (ref 5–15)
BUN: 49 mg/dL — AB (ref 6–20)
CALCIUM: 7.6 mg/dL — AB (ref 8.9–10.3)
CHLORIDE: 105 mmol/L (ref 101–111)
CO2: 29 mmol/L (ref 22–32)
CREATININE: 1.26 mg/dL — AB (ref 0.44–1.00)
GFR calc Af Amer: 47 mL/min — ABNORMAL LOW (ref >60)
GFR, EST NON AFRICAN AMERICAN: 40 mL/min — AB (ref >60)
GLUCOSE: 138 mg/dL — AB (ref 65–99)
POTASSIUM: 3.8 mmol/L (ref 3.5–5.1)
Sodium: 140 mmol/L (ref 135–145)

## 2015-03-23 LAB — CBC
HCT: 30.3 % — ABNORMAL LOW (ref 35.0–47.0)
Hemoglobin: 9.9 g/dL — ABNORMAL LOW (ref 12.0–16.0)
MCH: 30.9 pg (ref 26.0–34.0)
MCHC: 32.8 g/dL (ref 32.0–36.0)
MCV: 94.2 fL (ref 80.0–100.0)
PLATELETS: 173 10*3/uL (ref 150–440)
RBC: 3.22 MIL/uL — AB (ref 3.80–5.20)
RDW: 15.7 % — ABNORMAL HIGH (ref 11.5–14.5)
WBC: 8 10*3/uL (ref 3.6–11.0)

## 2015-03-23 LAB — MAGNESIUM: Magnesium: 1.9 mg/dL (ref 1.7–2.4)

## 2015-03-23 LAB — GLUCOSE, CAPILLARY
GLUCOSE-CAPILLARY: 121 mg/dL — AB (ref 65–99)
GLUCOSE-CAPILLARY: 149 mg/dL — AB (ref 65–99)
GLUCOSE-CAPILLARY: 120 mg/dL — AB (ref 65–99)
Glucose-Capillary: 115 mg/dL — ABNORMAL HIGH (ref 65–99)
Glucose-Capillary: 141 mg/dL — ABNORMAL HIGH (ref 65–99)
Glucose-Capillary: 103 mg/dL — ABNORMAL HIGH (ref 65–99)

## 2015-03-23 NOTE — Progress Notes (Signed)
[place] NOTE  Pharmacy Consult for Phenytoin Monitoring      Allergies  Allergen Reactions  . Penicillins Rash  . Codeine Other (See Comments)    Unknown reaction.  . Ceftin [Cefuroxime Axetil] Hives    Unknown reaction  . Erythromycin Other (See Comments)    Unknown reaction  . Ibuprofen Other (See Comments)    Unknown reaction  . Sulfa Antibiotics Other (See Comments)    Unknown reaction    Patient Measurements: Height: 5\' 5"  (165.1 cm) Weight: 218 lb 4.1 oz (99 kg) IBW/kg (Calculated) : 57  Vital Signs: Temp: 98.4 F (36.9 C) (02/08 0800) Temp Source: Axillary (02/08 0800) BP: 110/54 mmHg (02/08 1000) Pulse Rate: 95 (02/08 1000) Intake/Output from previous day: 02/07 0701 - 02/08 0700 In: 2909.2 [I.V.:1139.2; NG/GT:1670; IV Piggyback:100] Out: 1250 [Urine:1100; Stool:150] Intake/Output from this shift: Total I/O In: 356.4 [I.V.:156.4; NG/GT:150; IV Piggyback:50] Out: -  Vent settings for last 24 hours: Vent Mode:  [-] PRVC FiO2 (%):  [40 %] 40 % Set Rate:  [15 bmp] 15 bmp Vt Set:  [450 mL] 450 mL PEEP:  [5 cmH20] 5 cmH20  Labs:  Recent Labs  03/21/15 0455 03/23/15 0412  WBC 10.5 8.0  HGB 11.4* 9.9*  HCT 35.2 30.3*  PLT 151 173  CREATININE 1.18* 1.26*  MG 2.0 1.9  PHOS 3.3  --    Estimated Creatinine Clearance: 44.3 mL/min (by C-G formula based on Cr of 1.26).   Recent Labs  03/23/15 0033 03/23/15 0407 03/23/15 0748  GLUCAP 115* 121* 141*    Microbiology: Recent Results (from the past 720 hour(s))  Culture, blood (Routine X 2) w Reflex to ID Panel     Status: None   Collection Time: 03/10/2015  1:00 PM  Result Value Ref Range Status   Specimen Description BLOOD LEFT ANTECUBITAL  Final   Special Requests BOTTLES DRAWN AEROBIC AND ANAEROBIC  1CC  Final   Culture NO GROWTH 5 DAYS  Final   Report Status 03/10/2015 FINAL  Final  Culture, blood (Routine X 2) w Reflex to ID Panel     Status: None   Collection Time:  02/25/2015  1:04 PM  Result Value Ref Range Status   Specimen Description BLOOD LEFT HAND  Final   Special Requests BOTTLES DRAWN AEROBIC AND ANAEROBIC  1CC  Final   Culture NO GROWTH 5 DAYS  Final   Report Status 03/10/2015 FINAL  Final  MRSA PCR Screening     Status: None   Collection Time: 03/07/15  5:32 PM  Result Value Ref Range Status   MRSA by PCR NEGATIVE NEGATIVE Final    Comment:        The GeneXpert MRSA Assay (FDA approved for NASAL specimens only), is one component of a comprehensive MRSA colonization surveillance program. It is not intended to diagnose MRSA infection nor to guide or monitor treatment for MRSA infections.   MRSA PCR Screening     Status: None   Collection Time: 03/11/15 11:20 AM  Result Value Ref Range Status   MRSA by PCR NEGATIVE NEGATIVE Final    Comment:        The GeneXpert MRSA Assay (FDA approved for NASAL specimens only), is one component of a comprehensive MRSA colonization surveillance program. It is not intended to diagnose MRSA infection nor to guide or monitor treatment for MRSA infections.   Culture, respiratory (NON-Expectorated)     Status: None   Collection Time: 03/14/15  9:28  PM  Result Value Ref Range Status   Specimen Description TRACHEAL ASPIRATE  Final   Special Requests Normal  Final   Gram Stain MODERATE WBC SEEN NO ORGANISMS SEEN   Final   Culture NO GROWTH 3 DAYS  Final   Report Status 03/17/2015 FINAL  Final    Medications:  Scheduled:  . antiseptic oral rinse  7 mL Mouth Rinse 10 times per day  . aspirin  81 mg Per Tube Daily  . budesonide (PULMICORT) nebulizer solution  0.5 mg Nebulization BID  . chlorhexidine gluconate  15 mL Mouth Rinse BID  . clopidogrel  75 mg Per Tube Daily  . enoxaparin (LOVENOX) injection  40 mg Subcutaneous Q24H  . famotidine (PEPCID) IV  20 mg Intravenous Q12H  . free water  200 mL Per Tube 3 times per day  . ipratropium-albuterol  3 mL Nebulization Q6H  . levothyroxine  200  mcg Per Tube QHS  . loratadine  10 mg Oral Daily  . mirtazapine  7.5 mg Per Tube QHS  . phenytoin (DILANTIN) IV  100 mg Intravenous 3 times per day  . sodium chloride  3 mL Intravenous Q12H  . sodium chloride flush  10-40 mL Intracatheter Q12H   Infusions:  . feeding supplement (VITAL HIGH PROTEIN) 1,000 mL (03/23/15 0420)  . fentaNYL 10 mcg/ml infusion 150 mcg/hr (03/23/15 0958)  . norepinephrine (LEVOPHED) Adult infusion 18.027 mcg/min (03/23/15 0812)  . propofol (DIPRIVAN) infusion 40 mcg/kg/min (03/23/15 [id number])    Assessment: Pharmacy consulted to monitor phenytoin for [name].  Plan:  1. Phenytoin: Level 2/4 obtained 9.2, albumin 2.1, corrected phenytoin level 17.9. Phenytoin level is within goal range; therefore will continue current regimen of Phenytoin 100 mg IV q8 hours.   Pharmacy will continue to monitor and adjust per consult.    Ulice Dash D 03/23/2015,11:35 AM

## 2015-03-23 NOTE — Progress Notes (Signed)
[place] [facility] [place] NAME: Julie Anthony    MR#:  [id number]  DATE OF BIRTH:  08/03/1940  SUBJECTIVE:  Patient is 75 year old Caucasian female who presents to the hospital with complaints of cough, congestion, shortness of breath . On arrival to emergency room, she was found to have persistent left lower lobe pneumonia and acute renal failure and abnormal LFTs, elevated troponin of 5.5, consistent with non-STEMI . Initially managed on BiPAP , deteriorated. On February 3 and intubated due to hypotension requiring Levophed, she was obtunded due to metabolic encephalopathy due to hypercapnia. Remains intubated and sedated. Patient's POA was considering terminal extubation , but then they changed their mind, Dr. Jamal Collin discussed with patient's family and terminal extubation was planned on February 9, however, family meeting is being planned for today again by critical care physician, Dr. Jamal Collin, palliative care physician, family members, PACE representative . Patient is intubated, sedated, not able to review systems  REVIEW OF SYSTEMS:  Unable to obtain given patient's mental status /medical condition-baseline dementia with metabolic encephalopathy  DRUG ALLERGIES:   Allergies  Allergen Reactions  . Penicillins Rash  . Codeine Other (See Comments)    Unknown reaction.  . Ceftin [Cefuroxime Axetil] Hives    Unknown reaction  . Erythromycin Other (See Comments)    Unknown reaction  . Ibuprofen Other (See Comments)    Unknown reaction  . Sulfa Antibiotics Other (See Comments)    Unknown reaction    VITALS:  Blood pressure 109/54, pulse 86, temperature 98.7 F (37.1 C), temperature source Axillary, resp. rate 15, height 5\' 5"  (1.651 m), weight 99 kg (218 lb 4.1 oz), SpO2 100 %.  PHYSICAL EXAMINATION:   VITAL SIGNS: Filed Vitals:   03/23/15 1200 03/23/15 1211  BP: 109/54   Pulse: 86   Temp:  98.7 F (37.1 C)  Resp: 77     GENERAL:74 y.o.female intubated and sedated,  now on mechanical ventilation. Sedation. She has occasional agitation, but not tracking and remains on full vent support, also on low-dose of Levophed due to hypotension Respiratory status HEAD: Normocephalic, atraumatic.  EYES: Pupils equal, round, reactive to light. Unable to assess extraocular muscles given mental status/medical condition. No scleral icterus.  MOUTH: Dry mucosal membrane. Dentition intact. No abscess noted.  EAR, NOSE, THROAT: Clear without exudates. No external lesions.  NECK: Supple. No thyromegaly. No nodules. No JVD.  PULMONARY: Coarse breath sounds throughout without wheeze rails or rhonci. No use of accessory muscles, poor respiratory effort requiring BiPAP. Poor air entry bilaterally  , more so on the left CHEST: Nontender to palpation.  CARDIOVASCULAR: S1 and S2. Regular rate and rhythm. No murmurs, rubs, or gallops. No edema. Pedal pulses 2+ bilaterally.  GASTROINTESTINAL: Soft, nontender, nondistended. No masses. Positive bowel sounds. No hepatosplenomegaly.  MUSCULOSKELETAL: No swelling, clubbing, or edema. Range of motion full in all extremities.  NEUROLOGIC: Unable to assess given mental status/medical condition, spontaneous movement in all extremities SKIN: No ulceration, lesions, rashes, or cyanosis. Skin warm and dry. Turgor intact.  PSYCHIATRIC: Unable to assess given mental status/medical condition       LABORATORY PANEL:   CBC  Recent Labs Lab 03/23/15 0412  WBC 8.0  HGB 9.9*  HCT 30.3*  PLT 173   ------------------------------------------------------------------------------------------------------------------  Chemistries   Recent Labs Lab 03/23/15 0412  NA 140  K 3.8  CL 105  CO2 29  GLUCOSE 138*  BUN 49*  CREATININE 1.26*  CALCIUM 7.6*  MG 1.9   ------------------------------------------------------------------------------------------------------------------  Cardiac  Enzymes No results for input(s): TROPONINI in the last 168 hours. ------------------------------------------------------------------------------------------------------------------  RADIOLOGY:  Dg Chest Port 1 View  03/22/2015  CLINICAL DATA:  Respiratory failure. EXAM: PORTABLE CHEST 1 VIEW COMPARISON:  03/19/2015. FINDINGS: Endotracheal tube tip noted 9 mm above the carina. Proximal repositioning of 2-3 cm suggested . Right PICC line and NG tube in stable position. Cardiomegaly. Persistent bilateral pulmonary alveolar infiltrates consistent with pulmonary edema again noted. Slight interim improvement. Small pleural effusions cannot be excluded. No pneumothorax. IMPRESSION: 1. Endotracheal tube tip noted 9 mm above the carina. Proximal repositioning to 3 cm suggested. Remaining lines and tubes in stable position. 2. Cardiomegaly with bilateral pulmonary infiltrates suggesting pulmonary edema. Slight improvement from prior exam. Small bilateral pleural effusions. These results will be called to the ordering clinician or representative by the Radiologist Assistant, and communication documented in the PACS or zVision Dashboard. Electronically Signed   By: Marcello Moores  Register   On: 03/22/2015 07:31    EKG:   Orders placed or performed during the hospital encounter of 03/09/2015  . ED EKG  . ED EKG    ASSESSMENT AND PLAN:   75 year old Caucasian female with chronic respiratory failure on 2 L of oxygen through nasal cannula at home, who lives alone presents to the hospital with complaints of cough, chest congestion as well as shortness of breath. Admitted 02/24/2015  1. Acute on chronic respiratory failure with hypoxia and hypercapnia due to acute exacerbation of COPD, left lower lobe  aspiration pneumonia, Meropenem completed 03/17/15 Continued need for BiPAP Initially , now intubated , continue current therapy , pulmonologist was consulted and his care is appreciated. The pulmonologist/intensivist is to meet  with family again today and discuss possible terminal extubation   2. Hypernatremia, resolved on tube feeds, follow closely, dietitian is involved  3. Acute renal failure, resolved   4. Non-Q-wave MI, type 2, due to demand ischemia, per cardiologist, continue aspirin, heparin , nitrates, atenolol, no intervention was recommended by Cardiologist  5. Elevated transaminases , improved  6. Venous thromboembolism prophylactic: Lovenox  7. Acute metabolic  encephalopathy in the setting of baseline dementia due to hypercapnia/hypoxia, stable clinically while patient is intubated and sedated.   Patient's family/power of attorney for medical care . He is meeting again with intensivist and PACE representative to discuss medical care, treatment plan     All the records are reviewed and case discussed with Care Management/Social Workerr. Management plans discussed with the patient, family and they are in agreement.  CODE STATUS: Full  TOTAL TIME TAKING CARE OF THIS PATIENT:  30 Minutes    .   Theodoro Grist M.D on 03/23/2015 at 1:40 PM  Between 7am to 6pm - Pager - [phone]  After 6pm: House Pager: - [phone]  [facility]  Office  [phone]  CC: Primary care physician; [place]

## 2015-03-23 NOTE — Progress Notes (Signed)
eLink Physician-Brief Progress Note Patient Name: Julie Anthony DOB: 02-07-1941 MRN: [id number]   Date of Service  03/23/2015  HPI/Events of Note  Watery stools d/t Lactulose.   eICU Interventions  Will place Flexiseal.      Intervention Category Intermediate Interventions: Other:  Karlo Goeden Cornelia Copa 03/23/2015, 3:15 AM

## 2015-03-23 NOTE — Progress Notes (Signed)
Chaplain rounded in the unit and provided a compasioate presence and support to the patient. Said silent prayer. Chaplain Teneil Shiller [phone] 

## 2015-03-23 NOTE — Progress Notes (Signed)
Nutrition Follow-up     INTERVENTION:   EN: recommend continuing current TF regimen; if pt not extubated, will need to adjust TF regimen based on kcals from diprivan. Will continue to assess  NUTRITION DIAGNOSIS:   Inadequate oral intake related to inability to eat as evidenced by NPO status. Being addressed via TF  GOAL:   Patient will meet greater than or equal to 90% of their needs  MONITOR:    (Energy Intake, Anthropometrics, Pulmonary Profile, Digestive System)  REASON FOR ASSESSMENT:   LOS    ASSESSMENT:   Pt admitted with NSTEMI, recently transferred to ICU on bipap for respiratory acidosis.   Pt remains on vent, possible terminal extubation tomorrow  Diet Order:   NPO  EN: tolerating Vital HIgh Protein at rate of 50 ml/hr  Digestive system: abdomen soft, obese; BS hypoactive, +diarrhea with rectal tube inserted (lactulose stopped today)  Skin:   (stage I coccyx)  Electrolyte and Renal Profile:  Recent Labs Lab 03/19/15 1704 03/20/15 0426 03/21/15 0455 03/23/15 0412  BUN  --  31* 38* 49*  CREATININE  --  0.99 1.18* 1.26*  NA  --  145 141 140  K  --  3.9 4.0 3.8  MG 2.4 2.2 2.0 1.9  PHOS 3.3 3.0 3.3  --    Glucose Profile:  Recent Labs  03/23/15 0407 03/23/15 0748 03/23/15 1111  GLUCAP 121* 141* 149*   Meds:  Diprivan (381 kcals in 24 hours), remeron  Height:   Ht Readings from Last 1 Encounters:  03/19/15 5\' 5"  (1.651 m)    Weight:   Wt Readings from Last 1 Encounters:  03/23/15 218 lb 4.1 oz (99 kg)    BMI:  Body mass index is 36.32 kg/(m^2).  Estimated Nutritional Needs:   Kcal:  (11-14 kcals/kg) Using wt of 85kg) [phone] kcals/d.   Protein:  (2.0-2.5 g/kg) Using IBW of 57kg 114-143 g/d  Fluid:  (25-50ml/kg) 2125-2515ml/d  EDUCATION NEEDS:   Education needs no appropriate at this time  [place], [place], LDN [phone] Pager  [phone] Weekend/On-Call Pager

## 2015-03-23 NOTE — Telephone Encounter (Signed)
LM for HCPOA to call back.

## 2015-03-23 NOTE — Progress Notes (Signed)
Spoke with Dr. Oletta Darter in [place]. PT has had four loose, watery BMs since 1800. Pt on tube feedings, and receiving lactulose. Order to place rectal tube.

## 2015-03-23 NOTE — Progress Notes (Signed)
PULMONARY / CRITICAL CARE MEDICINE   Name: Julie Anthony MRN: [id number] DOB: May 10, 1940    ADMISSION DATE:  02/26/2015   CHIEF COMPLAINT: SOB and cough  HISTORY OF PRESENT ILLNESS:   Julie Anthony is a 75 y.o. female has a past medical history significant for CAD, dementia, COPD/asthma, OA and breast cancer who presents to ER with persistent cough, congestion, and SOB. Was being treated by her PCP for pneumonia with Septra DS. In ER, pt found to have persistent LLL pneumonia with ARF and abnormal LFT's. Also noted to have troponin=5.5 c/w NSTEMI. She denies CP. On 02/03, she was noted to have LLL opacity, worsening respiratory distress on BiPAP, hypotension and intubated on 2/3.   SUBJECTIVE:  Awake with occasional agitation but no tracking. Remains on full vent support; ST with rates in the low 100s. On low dose levophed. Family meeting regarding care goals planned for thursday  VITAL SIGNS: BP 119/63 mmHg  Pulse 99  Temp(Src) 98.4 F (36.9 C) (Axillary)  Resp 15  Ht 5\' 5"  (1.651 m)  Wt 218 lb 4.1 oz (99 kg)  BMI 36.32 kg/m2  SpO2 96%  HEMODYNAMICS:    VENTILATOR SETTINGS: Vent Mode:  [-] PRVC FiO2 (%):  [40 %] 40 % Set Rate:  [15 bmp] 15 bmp Vt Set:  [450 mL] 450 mL PEEP:  [5 cmH20] 5 cmH20  INTAKE / OUTPUT: I/O last 3 completed shifts: In: 4030.9 [I.V.:1460.9; NG/GT:2420; IV Piggyback:150] Out: 1600 [Urine:1450; Stool:150]  PHYSICAL EXAMINATION: General: Awake Neuro: Restless with mild stimulation, eye open, does not track or follow commands HEENT:  PERRLA, ETT, Cardiovascular: ST, s1/s2, no mrg; +2 pulses  Lungs: Scattered rhonchi, no wheezing Abdomen: normal BS, ND/ND Musculoskeletal:  No joint deformities Skin:  No rash, warm  LABS:  BMET  Recent Labs Lab 03/20/15 0426 03/21/15 0455 03/23/15 0412  NA 145 141 140  K 3.9 4.0 3.8  CL 109 106 105  CO2 33* 30 29  BUN 31* 38* 49*  CREATININE 0.99 1.18* 1.26*  GLUCOSE 143* 130* 138*     Electrolytes  Recent Labs Lab 03/19/15 1704 03/20/15 0426 03/21/15 0455 03/23/15 0412  CALCIUM  --  7.3* 7.4* 7.6*  MG 2.4 2.2 2.0 1.9  PHOS 3.3 3.0 3.3  --     CBC  Recent Labs Lab 03/19/15 0548 03/21/15 0455 03/23/15 0412  WBC 14.6* 10.5 8.0  HGB 11.4* 11.4* 9.9*  HCT 35.9 35.2 30.3*  PLT 156 151 173    Coag's No results for input(s): APTT, INR in the last 168 hours.  Sepsis Markers No results for input(s): LATICACIDVEN, PROCALCITON, O2SATVEN in the last 168 hours.  ABG  Recent Labs Lab 03/17/15 0346 03/18/15 1706 03/19/15 0600  PHART 7.36 7.40 7.59*  PCO2ART 65* 54* 34  PO2ART 86 58* 75*    Liver Enzymes  Recent Labs Lab 03/19/15 1404  ALBUMIN 2.1*    Cardiac Enzymes No results for input(s): TROPONINI, PROBNP in the last 168 hours.  Glucose  Recent Labs Lab 03/22/15 1154 03/22/15 1611 03/22/15 2106 03/23/15 0033 03/23/15 0407 03/23/15 0748  GLUCAP 161* 135* 113* 115* 121* 141*    Imaging No results found.  STUDIES:  1/23 CT Chest - Left upper and left lower lobe pulmonary opacities, suspicious for infection or aspiration. Fluid in the left endobronchial tree could relate to mucous plugging or aspiration. Enlarged PA.   SIGNIFICANT EVENTS: Intubated 03/14/2015. Self extubated 2 on 03/15/2015. Reintubated on 2/3>>   CULTURES: Bld Cx  x 2 1/21 >>negative to date MRSA >>negative Sputum Cx 1/22>> negative  sputum culture 1/30>> negative  ANTIBIOTICS Levaquin 1/21>>1/24 Meropenem 1/24>> 2/02  Vanc 1/21>>1/24 Aztreonam 1/21>>1/24   ASSESSMENT / PLAN:  PULMONARY A: Acute hypoxic respiratory failure(Re-intubated x 3; on 02/03)  LLL opacity c/w pneumonia with mucus plugs. Multifocal Pneumonia  P:   -Full vent support -VAP prevention -F/U CXR -Pulmicort -Duoneb -Prednisone taper  CARDIOVASCULAR A:  NSTEMI Hypotension P:  -Cont with asa and plavix -Levophed gtt and titrate to MAP goal  INFECTIOUS A:    HCAP- all cultures negative to date P:   -Abx completed.  --Pan culture if febrile   ENDOCRINE A Hypothyroidism  P -Continue synthroid to 200 mcg daily  NEUROLOGIC A:   Acute encepahlopathy P:   RASS goal: -1 to -2 -Fentanyl, propofol and versed for vent sedation and comfort   FAMILY  - Updates: No family at bedside during rounds on 02/02. Terminal wean planned for Thursday 02/09 based on the last discussion Dr. Alva Garnet had with [facility] and her husband. Full comfort care recommended; awaiting final decision from [place]. Palliative Care involved.    - Inter-disciplinary family meet or Palliative Care meeting due by:  02/09    Best Practice: Code Status:  DNR. Diet: NPO/Tube feeds GI prophylaxis:  PPI. VTE prophylaxis:  SCD's / lovenox.    Magdalene S. Tukov ANP-BC Pulmonary and Critical Care Medicine [place] Pager: [phone]  03/23/2015, 10:17 AM  .PCCM ATTENDING ATTESTATION:  I have evaluated patient with ANP Patria Mane, reviewed database in its entirety and discussed care plan in detail. In addition, this patient was discussed on multidisciplinary rounds. Anticipating terminal extubation tomorrow  Merton Border, MD PCCM service Mobile [phone] Pager [phone]

## 2015-03-24 DIAGNOSIS — J9601 Acute respiratory failure with hypoxia: Secondary | ICD-10-CM

## 2015-03-24 LAB — GLUCOSE, CAPILLARY
GLUCOSE-CAPILLARY: 123 mg/dL — AB (ref 65–99)
Glucose-Capillary: 102 mg/dL — ABNORMAL HIGH (ref 65–99)
Glucose-Capillary: 136 mg/dL — ABNORMAL HIGH (ref 65–99)
Glucose-Capillary: 150 mg/dL — ABNORMAL HIGH (ref 65–99)

## 2015-03-24 MED ORDER — SODIUM CHLORIDE 0.9 % IV SOLN: 2.0000 mg/h | INTRAVENOUS | Status: DC

## 2015-03-24 MED ORDER — LORAZEPAM 2 MG/ML IJ SOLN: 1.0000 mg | INTRAMUSCULAR | Status: DC | PRN

## 2015-03-24 MED ORDER — MORPHINE 100MG IN NS 100ML (1MG/ML) PREMIX INFUSION
2.0000 mg/h | INTRAVENOUS | Status: DC
  Administered 2015-03-24: 8 mg/h via INTRAVENOUS
  Filled 2015-03-24 (×2): qty 100

## 2015-03-24 MED ORDER — ONDANSETRON HCL 4 MG/2ML IJ SOLN
4.0000 mg | INTRAMUSCULAR | Status: DC | PRN
Start: 2015-03-24 — End: 2015-03-24

## 2015-03-25 NOTE — Telephone Encounter (Signed)
Closing chart due to pt is deceased now.

## 2015-04-13 NOTE — Progress Notes (Signed)
[place] at [place] NAME: Alieza Stepanski    MR#:  [id number]  DATE OF BIRTH:  1940/09/19  SUBJECTIVE:  CHIEF COMPLAINT:   Chief Complaint  Patient presents with  . Dysphagia  . Cough   - Intubated on vent, sedated. Gurgling secretions- seen prior to extubation - awaiting palliative care meeting and terminal extubation this afternoon   REVIEW OF SYSTEMS:  Review of Systems  Unable to perform ROS: critical illness    DRUG ALLERGIES:   Allergies  Allergen Reactions  . Penicillins Rash  . Codeine Other (See Comments)    Unknown reaction.  . Ceftin [Cefuroxime Axetil] Hives    Unknown reaction  . Erythromycin Other (See Comments)    Unknown reaction  . Ibuprofen Other (See Comments)    Unknown reaction  . Sulfa Antibiotics Other (See Comments)    Unknown reaction    VITALS:  Blood pressure 110/56, pulse 88, temperature 98.8 F (37.1 C), temperature source Oral, resp. rate 15, height 5\' 5"  (1.651 m), weight 99.6 kg (219 lb 9.3 oz), SpO2 96 %.  PHYSICAL EXAMINATION:  Physical Exam  GENERAL:75 y.o.female intubated and sedated, now on mechanical ventilation. Sedated HEAD: Normocephalic, atraumatic.  EYES: Pupils equal, round, minimally reactive to light. Unable to assess extraocular muscles given mental status/medical condition. No scleral icterus.  MOUTH: Dry mucosal membrane. Dentition intact. No abscess noted.orally intubated  EAR, NOSE, THROAT: Clear without exudates. No external lesions.  NECK: Supple. No thyromegaly. No nodules. No JVD.  PULMONARY: Coarse breath sounds throughout without wheeze rails or rhonci. No use of accessory muscles, poor respiratory effort. Poor air entry bilaterally CHEST: Nontender to palpation.  CARDIOVASCULAR: S1 and S2. Regular rate and rhythm. No murmurs, rubs, or gallops. No edema. Pedal pulses 2+ bilaterally.  GASTROINTESTINAL: Soft, nontender, nondistended. No masses.  Positive bowel sounds. No hepatosplenomegaly.  MUSCULOSKELETAL: No swelling, clubbing, or edema.  NEUROLOGIC: Unable to assess given mental status/medical condition, sedated SKIN: No ulceration, lesions, rashes, or cyanosis. Skin warm and dry. Turgor intact.  PSYCHIATRIC: Unable to assess given mental status/medical condition   LABORATORY PANEL:   CBC  Recent Labs Lab 03/23/15 0412  WBC 8.0  HGB 9.9*  HCT 30.3*  PLT 173   ------------------------------------------------------------------------------------------------------------------  Chemistries   Recent Labs Lab 03/23/15 0412  NA 140  K 3.8  CL 105  CO2 29  GLUCOSE 138*  BUN 49*  CREATININE 1.26*  CALCIUM 7.6*  MG 1.9   ------------------------------------------------------------------------------------------------------------------  Cardiac Enzymes No results for input(s): TROPONINI in the last 168 hours. ------------------------------------------------------------------------------------------------------------------  RADIOLOGY:  No results found.  EKG:   Orders placed or performed during the hospital encounter of 03/10/2015  . ED EKG  . ED EKG    ASSESSMENT AND PLAN:   75 year old Caucasian female with chronic respiratory failure on 2L of oxygen through nasal cannula at home, who lives alone presents to the hospital with complaints of cough, chest congestion as well as shortness of breath.   1. Acute on chronic respiratory failure with hypoxia and hypercapnia due to acute exacerbation of COPD, left lower lobe aspiration pneumonia, - failed Bipap, intubated Meropenem completed 03/17/15 Appreciate palliative care and pulmonary critical care consultation Plan for terminal extubation today  2. Hypernatremia, on tube feeds,  3. Acute renal failure, resolved   4. Non-Q-wave MI, type 2, due to demand ischemia, per cardiologist,   5. Elevated transaminases  6. Acute metabolic encephalopathy in  the setting of baseline dementia due  to hypercapnia/hypoxia,  Clinical deterioration and planned terminal extubation today.  All the records are reviewed and case discussed with Care Management/Social Workerr. Management plans discussed with the patient, family and they are in agreement.  CODE STATUS: DNR  TOTAL TIME TAKING CARE OF THIS PATIENT: 37 minutes.     Myrakle Wingler M.D on 30-Mar-2015 at 5:09 PM  Between 7am to 6pm - Pager - [phone]  After 6pm go to [email or web address] - password [email or web address] [place] [facility]  Office  [phone]  CC: Primary care physician; [place]

## 2015-04-13 NOTE — Clinical Social Work Note (Signed)
CSW has noted that patient will be a terminal wean today. Shela Leff MSW,LCSW [phone]

## 2015-04-13 NOTE — Progress Notes (Signed)
[place] at [facility]    Death Note    In breif - 75 year old Caucasian female with chronic respiratory failure on 2L of oxygen through nasal cannula at home, who lives alone presents to the hospital with complaints of cough, chest congestion as well as shortness of breath.  Patient failed BiPAP, admitted to ICU, intubated and has been on pressors and also sedation. Clinical deterioration and discussing with family, plan was to terminally extubate her. Appreciate palliative care consult and also intensivist consult.  Final diagnosis  1. Acute on chronic respiratory failure 2. COPD exacerbation 3. Left lower lobe pneumonia 4. Acute renal failure 5. Metabolic encephalopathy 6. History of breast cancer 7. Coronary artery disease 8. Dementia 9. Anemia     Pricilla Borowy [id number] is a 75 y.o. female, Outpatient Primary MD for the patient is Canavanas  Pronounced dead by Dr. Megan Salon on 04-20-15   @    1642                 Gladstone Lighter M.D on April 20, 2015 at 6:27 PM  [place] [facility] [place]  Total clinical and documentation time for today Under 30 minutes

## 2015-04-13 NOTE — Progress Notes (Signed)
Death Pronouncement   Pt was terminally extubated at 16:30 and she was made comfortable.  Brother and others were in attendance.  I stayed bedside and watched as pt's respirations stopped first. Then, at 16:42, her pulses were gone and she was found to have no respirations, no heart sounds, and pupils were fixed.  She is pronounced dead at 16:42 (she had some agonal electrical beats noted for a couple of minutes but due to PEA, she was truly deceased at 16:42 pm.   Colleen Can, MD

## 2015-04-13 NOTE — Progress Notes (Signed)
Palliative Care Update  Met with HCPOA and her husband and PACE MD.  Plan is for terminal extubation today at 3:30.  Jannifer Hick will call nurse with name of funeral home prior to 3:30 pm.  Full note to follow.  Colleen Can, MD

## 2015-04-13 NOTE — Progress Notes (Signed)
Dr. Bridget Hartshorn pronounced pt at 1642. No heart sounds auscultated. Family at bedside and verbally supportive.

## 2015-04-13 NOTE — Progress Notes (Signed)
Nutrition Brief Note  Chart reviewed. Plan is for terminal extubation today No further nutrition interventions warranted at this time. Will sign off. Please re-consult as needed.   Kerman Passey [place], [place], LDN [phone] Pager  [phone] Weekend/On-Call Pager

## 2015-04-13 NOTE — Progress Notes (Signed)
Pt terminally extubated at 39 by Jenny Reichmann.

## 2015-04-13 NOTE — Progress Notes (Signed)
Palliative Medicine Inpatient Consult Follow Up Note   Name: Julie Anthony Date: 2015-04-15 MRN: [id number]  DOB: 16-Feb-1940  Referring Physician: Gladstone Lighter, MD  Palliative Care consult requested for this 75 y.o. female for goals of medical therapy in patient with respiratory failure that has not responded to ventilation for well over a week now.  TODAY'S DISCUSSIONS AND DECISIONS: I met with pt's HCPOA and her husband today Lars Mage and Mercy Moore). We went over the patient's terminal condition and what a terminal extubation would be like. They are in full agreement with proceeding. They will not be here, as they feel they are at peace about this.  They said prayer and said their good byes.  Relatives of pt will be permitted to be present if they wish.  At nearly 4 pm today, we started the process for a terminal wean.  Pt's brother and niece are present as well as brother's care giver and the social worker for [facility].  Pt is to stay on the Fentanyl  Drip but will also have a low dose morphine drip. Other drips are to be DCd.   I will enter a death pronouncement once pt is taken off of levophed and extubated and she passes away.   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 COPD/ asthma Bilateral Pleural Effusions Elevated LFTs ---GI consult obtained (AST and ALT both >1k ---now near or at nl) ---Deemed to be 'shock liver' likely from ischemia /  hypoxia. 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  ----she could prepare some of her own meals and was 'pretty functional' Mild Cardiomyopathy with EF 50% Leukocytosis Anemia --unclear etiology Mild Thrombocytopenia   REVIEW OF SYSTEMS:  Patient is not able to provide ROS due to critical illness   CODE STATUS: DNR   PAST MEDICAL HISTORY: Past Medical History  Diagnosis Date  . Cancer [facility])     right breast cancer  . S/P radiation therapy     right breast cancer    PAST SURGICAL HISTORY:  Past Surgical History  Procedure Laterality Date  . Abdominal hysterectomy    . Breast excisional biopsy Right     positive 2006  . Breast biopsy Left     negative 02/2012 stereotactic    Vital Signs: BP 110/56 mmHg  Pulse 88  Temp(Src) 98.8 F (37.1 C) (Oral)  Resp 15  Ht _0  (1.651 m)  Wt 99.6 kg (219 lb 9.3 oz)  BMI 36.54 kg/m2  SpO2 96% Filed Weights   03/22/15 0500 03/23/15 0500 15-Apr-2015 0420  Weight: 96.2 kg (212 lb 1.3 oz) 99 kg (218 lb 4.1 oz) 99.6 kg (219 lb 9.3 oz)    Estimated body mass index is 36.54 kg/(m^2) as calculated from the following:   Height as of  this encounter: _0  (1.651 m).   Weight as of this encounter: 99.6 kg (219 lb 9.3 oz).  PHYSICAL EXAM: Obtunded/ sedated Intubated and ventilated Large swollen tongue protruding Neck has tr JVD Hrt rrr with irreg beats Lungs with ronchi and decreased BS Abd soft and NT Ext no mottling or cyanosis as yet  LABS: CBC:    Component Value Date/Time   WBC 8.0 03/23/2015 0412   HGB 9.9* 03/23/2015 0412   HCT 30.3* 03/23/2015 0412   PLT 173 03/23/2015 0412   MCV 94.2 03/23/2015 0412   NEUTROABS 10.4* 03/13/2015 1300   LYMPHSABS 0.8* 02/19/2015 1300   MONOABS  0.9 03/01/2015 1300   EOSABS 0.0 03/01/2015 1300   BASOSABS 0.0 02/27/2015 1300   Comprehensive Metabolic Panel:    Component Value Date/Time   NA 140 03/23/2015 0412   K 3.8 03/23/2015 0412   CL 105 03/23/2015 0412   CO2 29 03/23/2015 0412   BUN 49* 03/23/2015 0412   CREATININE 1.26* 03/23/2015 0412   GLUCOSE 138* 03/23/2015 0412   CALCIUM 7.6* 03/23/2015 0412   AST 33 03/14/2015 0459   ALT 180* 03/14/2015 0459   ALKPHOS 80 03/14/2015 0459   BILITOT 0.6 03/14/2015 0459   PROT 5.4* 03/14/2015 0459   ALBUMIN 2.1* 03/19/2015 1404     More than 50% of the visit was spent in counseling/coordination of care: YES  Time Spent:  65 min

## 2015-04-13 NOTE — Progress Notes (Signed)
Notified [facility] [phone], pt not eligible to be a donor because of age. Release # [id number] spoke with Carmel [place].  Funeral Home chosen by Jasper Riling, POA. Funeral home: [facility], [place], [place]. Verbal release of patient to funeral home by [facility] via telephone. Verified by Melene Plan, RN and Kathyrn Drown RN.  Mortem care performed.

## 2015-04-13 NOTE — Progress Notes (Signed)
Chaplain provided a compassionate presence with family after the passing of [name]. Her brother said prayer.  Julie Anthony [phone]

## 2015-04-13 NOTE — Progress Notes (Signed)
Plan for terminal extubation later today noted. I will be available PRN  Merton Border, MD PCCM service Mobile [phone] Pager [phone]

## 2015-04-13 NOTE — Progress Notes (Signed)
Comfort care,extubated without complications to 1lnc

## 2015-04-13 DEATH — deceased

## 2015-04-29 ENCOUNTER — Inpatient Hospital Stay: Payer: Medicare (Managed Care) | Admitting: Internal Medicine
